# Patient Record
Sex: Male | Born: 1950
Health system: Southern US, Community
[De-identification: ages and names within clinical notes are randomized; demographics above are authoritative.]

## PROBLEM LIST (undated history)

## (undated) DIAGNOSIS — Z8619 Personal history of other infectious and parasitic diseases: Secondary | ICD-10-CM

## (undated) DIAGNOSIS — H5440 Blindness, one eye, unspecified eye: Secondary | ICD-10-CM

## (undated) DIAGNOSIS — K219 Gastro-esophageal reflux disease without esophagitis: Secondary | ICD-10-CM

## (undated) DIAGNOSIS — E871 Hypo-osmolality and hyponatremia: Secondary | ICD-10-CM

## (undated) DIAGNOSIS — N400 Enlarged prostate without lower urinary tract symptoms: Secondary | ICD-10-CM

## (undated) DIAGNOSIS — F1910 Other psychoactive substance abuse, uncomplicated: Secondary | ICD-10-CM

## (undated) DIAGNOSIS — K759 Inflammatory liver disease, unspecified: Secondary | ICD-10-CM

## (undated) DIAGNOSIS — D696 Thrombocytopenia, unspecified: Secondary | ICD-10-CM

## (undated) DIAGNOSIS — J449 Chronic obstructive pulmonary disease, unspecified: Secondary | ICD-10-CM

## (undated) DIAGNOSIS — H409 Unspecified glaucoma: Secondary | ICD-10-CM

## (undated) DIAGNOSIS — M75100 Unspecified rotator cuff tear or rupture of unspecified shoulder, not specified as traumatic: Secondary | ICD-10-CM

## (undated) HISTORY — DX: Other psychoactive substance abuse, uncomplicated: F19.10

## (undated) HISTORY — PX: CATARACT EXTRACTION: SUR2

---

## 2015-01-08 DIAGNOSIS — H2513 Age-related nuclear cataract, bilateral: Secondary | ICD-10-CM | POA: Diagnosis not present

## 2015-01-24 DIAGNOSIS — H40051 Ocular hypertension, right eye: Secondary | ICD-10-CM | POA: Diagnosis not present

## 2015-01-24 DIAGNOSIS — H2513 Age-related nuclear cataract, bilateral: Secondary | ICD-10-CM | POA: Diagnosis not present

## 2015-01-24 DIAGNOSIS — H401124 Primary open-angle glaucoma, left eye, indeterminate stage: Secondary | ICD-10-CM | POA: Diagnosis not present

## 2015-01-24 DIAGNOSIS — H5440 Blindness, one eye, unspecified eye: Secondary | ICD-10-CM | POA: Diagnosis not present

## 2015-01-24 DIAGNOSIS — H269 Unspecified cataract: Secondary | ICD-10-CM | POA: Diagnosis not present

## 2015-01-24 DIAGNOSIS — H25013 Cortical age-related cataract, bilateral: Secondary | ICD-10-CM | POA: Diagnosis not present

## 2015-02-19 DIAGNOSIS — H401124 Primary open-angle glaucoma, left eye, indeterminate stage: Secondary | ICD-10-CM | POA: Diagnosis not present

## 2015-04-24 DIAGNOSIS — F1210 Cannabis abuse, uncomplicated: Secondary | ICD-10-CM | POA: Diagnosis not present

## 2015-04-24 DIAGNOSIS — H53131 Sudden visual loss, right eye: Secondary | ICD-10-CM | POA: Diagnosis not present

## 2015-04-24 DIAGNOSIS — Z23 Encounter for immunization: Secondary | ICD-10-CM | POA: Diagnosis not present

## 2015-04-24 DIAGNOSIS — F1410 Cocaine abuse, uncomplicated: Secondary | ICD-10-CM | POA: Diagnosis not present

## 2015-04-24 DIAGNOSIS — Z7251 High risk heterosexual behavior: Secondary | ICD-10-CM | POA: Diagnosis not present

## 2015-04-24 DIAGNOSIS — H409 Unspecified glaucoma: Secondary | ICD-10-CM | POA: Diagnosis not present

## 2015-04-24 DIAGNOSIS — H269 Unspecified cataract: Secondary | ICD-10-CM | POA: Diagnosis not present

## 2015-04-24 DIAGNOSIS — F17210 Nicotine dependence, cigarettes, uncomplicated: Secondary | ICD-10-CM | POA: Diagnosis not present

## 2015-05-06 DIAGNOSIS — H401124 Primary open-angle glaucoma, left eye, indeterminate stage: Secondary | ICD-10-CM | POA: Diagnosis not present

## 2015-05-06 DIAGNOSIS — H40052 Ocular hypertension, left eye: Secondary | ICD-10-CM | POA: Diagnosis not present

## 2015-05-06 DIAGNOSIS — H40051 Ocular hypertension, right eye: Secondary | ICD-10-CM | POA: Diagnosis not present

## 2015-05-06 DIAGNOSIS — H44511 Absolute glaucoma, right eye: Secondary | ICD-10-CM | POA: Diagnosis not present

## 2015-05-06 DIAGNOSIS — H5340 Unspecified visual field defects: Secondary | ICD-10-CM | POA: Diagnosis not present

## 2015-05-08 DIAGNOSIS — Z7251 High risk heterosexual behavior: Secondary | ICD-10-CM | POA: Diagnosis not present

## 2015-05-14 DIAGNOSIS — A539 Syphilis, unspecified: Secondary | ICD-10-CM | POA: Diagnosis not present

## 2015-05-22 DIAGNOSIS — H44511 Absolute glaucoma, right eye: Secondary | ICD-10-CM | POA: Diagnosis not present

## 2015-05-22 DIAGNOSIS — H25012 Cortical age-related cataract, left eye: Secondary | ICD-10-CM | POA: Diagnosis not present

## 2015-05-22 DIAGNOSIS — H2512 Age-related nuclear cataract, left eye: Secondary | ICD-10-CM | POA: Diagnosis not present

## 2015-05-22 DIAGNOSIS — H401124 Primary open-angle glaucoma, left eye, indeterminate stage: Secondary | ICD-10-CM | POA: Diagnosis not present

## 2015-05-22 DIAGNOSIS — H40052 Ocular hypertension, left eye: Secondary | ICD-10-CM | POA: Diagnosis not present

## 2015-05-22 DIAGNOSIS — H40051 Ocular hypertension, right eye: Secondary | ICD-10-CM | POA: Diagnosis not present

## 2015-06-18 DIAGNOSIS — H2512 Age-related nuclear cataract, left eye: Secondary | ICD-10-CM | POA: Diagnosis not present

## 2015-07-23 DIAGNOSIS — J449 Chronic obstructive pulmonary disease, unspecified: Secondary | ICD-10-CM | POA: Diagnosis not present

## 2015-07-23 DIAGNOSIS — H409 Unspecified glaucoma: Secondary | ICD-10-CM | POA: Diagnosis not present

## 2015-07-23 DIAGNOSIS — F1210 Cannabis abuse, uncomplicated: Secondary | ICD-10-CM | POA: Diagnosis not present

## 2015-07-23 DIAGNOSIS — F1410 Cocaine abuse, uncomplicated: Secondary | ICD-10-CM | POA: Diagnosis not present

## 2015-07-23 DIAGNOSIS — H269 Unspecified cataract: Secondary | ICD-10-CM | POA: Diagnosis not present

## 2015-07-23 DIAGNOSIS — Z23 Encounter for immunization: Secondary | ICD-10-CM | POA: Diagnosis not present

## 2015-07-23 DIAGNOSIS — Z7251 High risk heterosexual behavior: Secondary | ICD-10-CM | POA: Diagnosis not present

## 2015-07-23 DIAGNOSIS — H53131 Sudden visual loss, right eye: Secondary | ICD-10-CM | POA: Diagnosis not present

## 2015-08-26 DIAGNOSIS — H401124 Primary open-angle glaucoma, left eye, indeterminate stage: Secondary | ICD-10-CM | POA: Diagnosis not present

## 2015-08-26 DIAGNOSIS — H44511 Absolute glaucoma, right eye: Secondary | ICD-10-CM | POA: Diagnosis not present

## 2015-08-26 DIAGNOSIS — H40051 Ocular hypertension, right eye: Secondary | ICD-10-CM | POA: Diagnosis not present

## 2015-10-16 DIAGNOSIS — F17210 Nicotine dependence, cigarettes, uncomplicated: Secondary | ICD-10-CM | POA: Diagnosis not present

## 2015-10-16 DIAGNOSIS — F1210 Cannabis abuse, uncomplicated: Secondary | ICD-10-CM | POA: Diagnosis not present

## 2015-10-16 DIAGNOSIS — F1410 Cocaine abuse, uncomplicated: Secondary | ICD-10-CM | POA: Diagnosis not present

## 2015-10-16 DIAGNOSIS — Z0000 Encounter for general adult medical examination without abnormal findings: Secondary | ICD-10-CM | POA: Diagnosis not present

## 2015-10-16 DIAGNOSIS — Z7251 High risk heterosexual behavior: Secondary | ICD-10-CM | POA: Diagnosis not present

## 2015-10-16 DIAGNOSIS — J449 Chronic obstructive pulmonary disease, unspecified: Secondary | ICD-10-CM | POA: Diagnosis not present

## 2015-10-23 DIAGNOSIS — H269 Unspecified cataract: Secondary | ICD-10-CM | POA: Insufficient documentation

## 2015-10-23 DIAGNOSIS — A539 Syphilis, unspecified: Secondary | ICD-10-CM | POA: Insufficient documentation

## 2015-10-23 DIAGNOSIS — F1410 Cocaine abuse, uncomplicated: Secondary | ICD-10-CM | POA: Insufficient documentation

## 2015-10-24 DIAGNOSIS — H53131 Sudden visual loss, right eye: Secondary | ICD-10-CM | POA: Diagnosis not present

## 2015-10-24 DIAGNOSIS — H409 Unspecified glaucoma: Secondary | ICD-10-CM | POA: Diagnosis not present

## 2015-10-24 DIAGNOSIS — J449 Chronic obstructive pulmonary disease, unspecified: Secondary | ICD-10-CM | POA: Diagnosis not present

## 2015-10-24 DIAGNOSIS — H269 Unspecified cataract: Secondary | ICD-10-CM | POA: Diagnosis not present

## 2015-10-24 DIAGNOSIS — Z7251 High risk heterosexual behavior: Secondary | ICD-10-CM | POA: Diagnosis not present

## 2015-10-24 DIAGNOSIS — F17210 Nicotine dependence, cigarettes, uncomplicated: Secondary | ICD-10-CM | POA: Diagnosis not present

## 2015-10-24 DIAGNOSIS — A539 Syphilis, unspecified: Secondary | ICD-10-CM | POA: Diagnosis not present

## 2015-10-24 DIAGNOSIS — F1410 Cocaine abuse, uncomplicated: Secondary | ICD-10-CM | POA: Diagnosis not present

## 2015-10-24 DIAGNOSIS — Z681 Body mass index (BMI) 19 or less, adult: Secondary | ICD-10-CM | POA: Diagnosis not present

## 2015-10-24 DIAGNOSIS — F1210 Cannabis abuse, uncomplicated: Secondary | ICD-10-CM | POA: Diagnosis not present

## 2015-11-01 DIAGNOSIS — R079 Chest pain, unspecified: Secondary | ICD-10-CM | POA: Diagnosis not present

## 2015-11-01 DIAGNOSIS — F1410 Cocaine abuse, uncomplicated: Secondary | ICD-10-CM | POA: Diagnosis not present

## 2015-11-01 DIAGNOSIS — F1010 Alcohol abuse, uncomplicated: Secondary | ICD-10-CM | POA: Diagnosis not present

## 2016-01-07 DIAGNOSIS — H44511 Absolute glaucoma, right eye: Secondary | ICD-10-CM | POA: Diagnosis not present

## 2016-01-07 DIAGNOSIS — H401124 Primary open-angle glaucoma, left eye, indeterminate stage: Secondary | ICD-10-CM | POA: Diagnosis not present

## 2016-01-07 DIAGNOSIS — H40051 Ocular hypertension, right eye: Secondary | ICD-10-CM | POA: Diagnosis not present

## 2016-01-07 DIAGNOSIS — H5340 Unspecified visual field defects: Secondary | ICD-10-CM | POA: Diagnosis not present

## 2016-01-20 DIAGNOSIS — F17200 Nicotine dependence, unspecified, uncomplicated: Secondary | ICD-10-CM | POA: Insufficient documentation

## 2016-01-20 DIAGNOSIS — Z7251 High risk heterosexual behavior: Secondary | ICD-10-CM | POA: Insufficient documentation

## 2016-01-20 DIAGNOSIS — F1210 Cannabis abuse, uncomplicated: Secondary | ICD-10-CM | POA: Insufficient documentation

## 2016-01-21 DIAGNOSIS — F1210 Cannabis abuse, uncomplicated: Secondary | ICD-10-CM | POA: Diagnosis not present

## 2016-01-21 DIAGNOSIS — Z7251 High risk heterosexual behavior: Secondary | ICD-10-CM | POA: Diagnosis not present

## 2016-01-21 DIAGNOSIS — D509 Iron deficiency anemia, unspecified: Secondary | ICD-10-CM | POA: Diagnosis not present

## 2016-01-21 DIAGNOSIS — J449 Chronic obstructive pulmonary disease, unspecified: Secondary | ICD-10-CM | POA: Diagnosis not present

## 2016-01-21 DIAGNOSIS — F17210 Nicotine dependence, cigarettes, uncomplicated: Secondary | ICD-10-CM | POA: Diagnosis not present

## 2016-01-21 DIAGNOSIS — H53131 Sudden visual loss, right eye: Secondary | ICD-10-CM | POA: Diagnosis not present

## 2017-01-07 DIAGNOSIS — H524 Presbyopia: Secondary | ICD-10-CM | POA: Diagnosis not present

## 2017-01-07 DIAGNOSIS — Z0100 Encounter for examination of eyes and vision without abnormal findings: Secondary | ICD-10-CM | POA: Diagnosis not present

## 2017-03-10 DIAGNOSIS — Z681 Body mass index (BMI) 19 or less, adult: Secondary | ICD-10-CM | POA: Diagnosis not present

## 2017-03-10 DIAGNOSIS — M66821 Spontaneous rupture of other tendons, right upper arm: Secondary | ICD-10-CM | POA: Diagnosis not present

## 2018-04-19 DIAGNOSIS — M545 Low back pain: Secondary | ICD-10-CM | POA: Diagnosis not present

## 2018-04-19 DIAGNOSIS — Z681 Body mass index (BMI) 19 or less, adult: Secondary | ICD-10-CM | POA: Diagnosis not present

## 2018-04-27 ENCOUNTER — Encounter: Payer: Self-pay | Admitting: Internal Medicine

## 2018-04-27 DIAGNOSIS — R350 Frequency of micturition: Secondary | ICD-10-CM | POA: Diagnosis not present

## 2018-04-27 DIAGNOSIS — L299 Pruritus, unspecified: Secondary | ICD-10-CM | POA: Diagnosis not present

## 2018-04-27 DIAGNOSIS — F1410 Cocaine abuse, uncomplicated: Secondary | ICD-10-CM | POA: Diagnosis not present

## 2018-04-27 DIAGNOSIS — J449 Chronic obstructive pulmonary disease, unspecified: Secondary | ICD-10-CM | POA: Diagnosis not present

## 2018-04-27 DIAGNOSIS — D509 Iron deficiency anemia, unspecified: Secondary | ICD-10-CM | POA: Diagnosis not present

## 2018-04-27 DIAGNOSIS — F1210 Cannabis abuse, uncomplicated: Secondary | ICD-10-CM | POA: Diagnosis not present

## 2018-04-27 DIAGNOSIS — R358 Other polyuria: Secondary | ICD-10-CM | POA: Diagnosis not present

## 2018-04-27 DIAGNOSIS — A539 Syphilis, unspecified: Secondary | ICD-10-CM | POA: Diagnosis not present

## 2018-04-27 DIAGNOSIS — F17210 Nicotine dependence, cigarettes, uncomplicated: Secondary | ICD-10-CM | POA: Diagnosis not present

## 2018-04-27 DIAGNOSIS — R1310 Dysphagia, unspecified: Secondary | ICD-10-CM | POA: Diagnosis not present

## 2018-04-27 DIAGNOSIS — Z681 Body mass index (BMI) 19 or less, adult: Secondary | ICD-10-CM | POA: Diagnosis not present

## 2018-04-29 DIAGNOSIS — A539 Syphilis, unspecified: Secondary | ICD-10-CM | POA: Diagnosis not present

## 2018-04-29 DIAGNOSIS — D509 Iron deficiency anemia, unspecified: Secondary | ICD-10-CM | POA: Diagnosis not present

## 2018-05-02 ENCOUNTER — Encounter (INDEPENDENT_AMBULATORY_CARE_PROVIDER_SITE_OTHER): Payer: Self-pay | Admitting: *Deleted

## 2018-05-02 ENCOUNTER — Ambulatory Visit (INDEPENDENT_AMBULATORY_CARE_PROVIDER_SITE_OTHER): Payer: Medicare HMO | Admitting: Internal Medicine

## 2018-05-02 ENCOUNTER — Encounter (INDEPENDENT_AMBULATORY_CARE_PROVIDER_SITE_OTHER): Payer: Self-pay | Admitting: Internal Medicine

## 2018-05-02 VITALS — BP 146/83 | HR 74 | Temp 98.0°F | Ht 72.0 in | Wt 136.8 lb

## 2018-05-02 DIAGNOSIS — R1310 Dysphagia, unspecified: Secondary | ICD-10-CM | POA: Insufficient documentation

## 2018-05-02 DIAGNOSIS — R1319 Other dysphagia: Secondary | ICD-10-CM

## 2018-05-02 DIAGNOSIS — B1920 Unspecified viral hepatitis C without hepatic coma: Secondary | ICD-10-CM | POA: Insufficient documentation

## 2018-05-02 DIAGNOSIS — B182 Chronic viral hepatitis C: Secondary | ICD-10-CM | POA: Diagnosis not present

## 2018-05-02 NOTE — Progress Notes (Signed)
   Subjective:    Patient ID: Ian Boyd, male    DOB: 10/01/1950, 68 y.o.   MRN: [id number]  HPI  Referred by Dr. Pleas Koch for dysphagia. Dysphagia has been occurring for about 10 yrs. Not a daily occurrence. No foods in particular bother him. Sometimes it is hard to swallow water. States foods are very slow to go down.  Appetite is okay. States had a cold about 3 weeks ago and lost about 3 pounds. BMs move normal. No melena or BRRB.   States he does not take any medications. He smokes marijuana and cocaine.  Does cocaine x 1 a week Hx if IV drugs about 40 yrs.    04/28/2018 H and H 15.6 and 46.2, ALP 86, AST 49, ALT 75 Hepatitis C antibody positive  Review of Systems Past Medical History:  Diagnosis Date  . Drug abuse ([place])       No Known Allergies  No current outpatient medications on file prior to visit.   No current facility-administered medications on file prior to visit.         Objective:   Physical Exam Blood pressure (!) 146/83, pulse 74, temperature 98 F (36.7 C), height 6' (1.829 m), weight 136 lb 12.8 oz (62.1 kg). Alert and oriented. Skin warm and dry. Oral mucosa is moist.   . Sclera anicteric, conjunctivae is pink. Thyroid not enlarged. No cervical lymphadenopathy. Lungs clear. Heart regular rate and rhythm.  Abdomen is soft. Bowel sounds are positive. No hepatomegaly. No abdominal masses felt. No tenderness.  No edema to lower extremities.           Assessment & Plan:  Dysphagia. DG esophagram. If EGD/needed will need to be with propofol.  Hepatitis C +antibody. Hep C quaint, Hep C genotype

## 2018-05-02 NOTE — Patient Instructions (Signed)
Labs, and esophagram.

## 2018-05-03 ENCOUNTER — Ambulatory Visit (HOSPITAL_COMMUNITY)
Admission: RE | Admit: 2018-05-03 | Discharge: 2018-05-03 | Disposition: A | Discharge status: Home / Self Care | Payer: Medicare HMO | Source: Ambulatory Visit | Attending: Internal Medicine | Admitting: Internal Medicine

## 2018-05-03 ENCOUNTER — Other Ambulatory Visit (INDEPENDENT_AMBULATORY_CARE_PROVIDER_SITE_OTHER): Payer: Self-pay | Admitting: Internal Medicine

## 2018-05-03 DIAGNOSIS — K228 Other specified diseases of esophagus: Secondary | ICD-10-CM | POA: Diagnosis not present

## 2018-05-03 DIAGNOSIS — R1310 Dysphagia, unspecified: Secondary | ICD-10-CM | POA: Diagnosis not present

## 2018-05-03 DIAGNOSIS — R1319 Other dysphagia: Secondary | ICD-10-CM

## 2018-05-04 ENCOUNTER — Telehealth (INDEPENDENT_AMBULATORY_CARE_PROVIDER_SITE_OTHER): Payer: Self-pay | Admitting: Internal Medicine

## 2018-05-04 ENCOUNTER — Other Ambulatory Visit (INDEPENDENT_AMBULATORY_CARE_PROVIDER_SITE_OTHER): Payer: Self-pay | Admitting: Internal Medicine

## 2018-05-04 DIAGNOSIS — R1310 Dysphagia, unspecified: Secondary | ICD-10-CM

## 2018-05-04 DIAGNOSIS — R1319 Other dysphagia: Secondary | ICD-10-CM

## 2018-05-04 NOTE — Telephone Encounter (Signed)
OV in 8 weeks.  

## 2018-05-05 LAB — HEPATITIS C RNA QUANTITATIVE
HCV Quantitative Log: 6.8 Log IU/mL — ABNORMAL HIGH
HCV RNA, PCR, QN: 6320000 IU/mL — ABNORMAL HIGH

## 2018-05-05 LAB — HEPATITIS C GENOTYPE

## 2018-05-06 DIAGNOSIS — J439 Emphysema, unspecified: Secondary | ICD-10-CM | POA: Diagnosis not present

## 2018-05-06 DIAGNOSIS — I700 Atherosclerosis of aorta: Secondary | ICD-10-CM | POA: Diagnosis not present

## 2018-05-06 DIAGNOSIS — R079 Chest pain, unspecified: Secondary | ICD-10-CM | POA: Diagnosis not present

## 2018-05-06 DIAGNOSIS — N200 Calculus of kidney: Secondary | ICD-10-CM | POA: Diagnosis not present

## 2018-05-06 DIAGNOSIS — F17210 Nicotine dependence, cigarettes, uncomplicated: Secondary | ICD-10-CM | POA: Diagnosis not present

## 2018-05-06 DIAGNOSIS — R911 Solitary pulmonary nodule: Secondary | ICD-10-CM | POA: Diagnosis not present

## 2018-05-12 ENCOUNTER — Other Ambulatory Visit: Payer: Self-pay

## 2018-05-12 ENCOUNTER — Encounter (HOSPITAL_COMMUNITY): Payer: Self-pay

## 2018-05-12 ENCOUNTER — Ambulatory Visit (HOSPITAL_COMMUNITY)
Admission: AC | Admit: 2018-05-12 | Discharge: 2018-05-12 | Disposition: A | Discharge status: Home / Self Care | Payer: Medicare HMO | Attending: Internal Medicine | Admitting: Internal Medicine

## 2018-05-12 ENCOUNTER — Encounter (HOSPITAL_COMMUNITY)
Admission: AC | Disposition: A | Discharge status: Home / Self Care | Payer: Self-pay | Source: Home / Self Care | Attending: Internal Medicine

## 2018-05-12 DIAGNOSIS — K229 Disease of esophagus, unspecified: Secondary | ICD-10-CM | POA: Insufficient documentation

## 2018-05-12 DIAGNOSIS — R1314 Dysphagia, pharyngoesophageal phase: Secondary | ICD-10-CM | POA: Insufficient documentation

## 2018-05-12 DIAGNOSIS — K2970 Gastritis, unspecified, without bleeding: Secondary | ICD-10-CM

## 2018-05-12 DIAGNOSIS — R933 Abnormal findings on diagnostic imaging of other parts of digestive tract: Secondary | ICD-10-CM | POA: Insufficient documentation

## 2018-05-12 DIAGNOSIS — K269 Duodenal ulcer, unspecified as acute or chronic, without hemorrhage or perforation: Secondary | ICD-10-CM

## 2018-05-12 DIAGNOSIS — Z79899 Other long term (current) drug therapy: Secondary | ICD-10-CM | POA: Insufficient documentation

## 2018-05-12 DIAGNOSIS — H5461 Unqualified visual loss, right eye, normal vision left eye: Secondary | ICD-10-CM | POA: Diagnosis not present

## 2018-05-12 DIAGNOSIS — K210 Gastro-esophageal reflux disease with esophagitis: Secondary | ICD-10-CM | POA: Insufficient documentation

## 2018-05-12 DIAGNOSIS — R1310 Dysphagia, unspecified: Secondary | ICD-10-CM

## 2018-05-12 DIAGNOSIS — K449 Diaphragmatic hernia without obstruction or gangrene: Secondary | ICD-10-CM | POA: Insufficient documentation

## 2018-05-12 DIAGNOSIS — R1319 Other dysphagia: Secondary | ICD-10-CM

## 2018-05-12 DIAGNOSIS — F17210 Nicotine dependence, cigarettes, uncomplicated: Secondary | ICD-10-CM | POA: Diagnosis not present

## 2018-05-12 DIAGNOSIS — K222 Esophageal obstruction: Secondary | ICD-10-CM

## 2018-05-12 HISTORY — DX: Blindness, one eye, unspecified eye: H54.40

## 2018-05-12 HISTORY — PX: ESOPHAGOGASTRODUODENOSCOPY: SHX5428

## 2018-05-12 HISTORY — PX: ESOPHAGEAL DILATION: SHX303

## 2018-05-12 LAB — KOH PREP

## 2018-05-12 SURGERY — EGD (ESOPHAGOGASTRODUODENOSCOPY)
Anesthesia: Moderate Sedation

## 2018-05-12 MED ORDER — NYSTATIN 100000 UNIT/ML MT SUSP: 5.0000 mL | Freq: Four times a day (QID) | OROMUCOSAL | 0 refills | Status: DC

## 2018-05-12 MED ORDER — MEPERIDINE HCL 50 MG/ML IJ SOLN
INTRAMUSCULAR | Status: DC | PRN
  Administered 2018-05-12 (×2): 25 mg via INTRAVENOUS

## 2018-05-12 MED ORDER — MEPERIDINE HCL 100 MG/ML IJ SOLN
INTRAMUSCULAR | Status: AC
  Filled 2018-05-12: qty 1

## 2018-05-12 MED ORDER — PANTOPRAZOLE SODIUM 40 MG PO TBEC: 40.0000 mg | DELAYED_RELEASE_TABLET | Freq: Two times a day (BID) | ORAL | 2 refills | Status: DC

## 2018-05-12 MED ORDER — SODIUM CHLORIDE 0.9 % IV SOLN
INTRAVENOUS | Status: DC
  Administered 2018-05-12: 14:00:00 via INTRAVENOUS

## 2018-05-12 MED ORDER — LIDOCAINE VISCOUS HCL 2 % MT SOLN
OROMUCOSAL | Status: DC | PRN
  Administered 2018-05-12: 4 mL via OROMUCOSAL

## 2018-05-12 MED ORDER — LIDOCAINE VISCOUS HCL 2 % MT SOLN
OROMUCOSAL | Status: AC
  Filled 2018-05-12: qty 15

## 2018-05-12 MED ORDER — MIDAZOLAM HCL 5 MG/5ML IJ SOLN
INTRAMUSCULAR | Status: AC
  Filled 2018-05-12: qty 10

## 2018-05-12 MED ORDER — STERILE WATER FOR IRRIGATION IR SOLN
Status: DC | PRN
  Administered 2018-05-12: 15:00:00

## 2018-05-12 MED ORDER — MIDAZOLAM HCL 5 MG/5ML IJ SOLN
INTRAMUSCULAR | Status: DC | PRN
  Administered 2018-05-12: 2 mg via INTRAVENOUS
  Administered 2018-05-12: 1 mg via INTRAVENOUS
  Administered 2018-05-12: 2 mg via INTRAVENOUS

## 2018-05-12 NOTE — Op Note (Signed)
[facility] Patient Name: Ian Boyd Procedure Date: 05/12/2018 2:36 PM MRN: [id number] Date of Birth: 1950-08-29 Attending MD: Hildred Laser , MD CSN: [id number] Age: 68 Admit Type: Outpatient Procedure:                Upper GI endoscopy Indications:              Esophageal dysphagia, Abnormal cine-esophagram Providers:                Hildred Laser, MD, Otis Peak B. Sharon Seller, RN, Randa Spike, Technician Referring MD:             Curlene Labrum, MD Medicines:                Lidocaine spray, Meperidine 50 mg IV, Midazolam 5                            mg IV Complications:            No immediate complications. Estimated Blood Loss:     Estimated blood loss was minimal. Procedure:                Pre-Anesthesia Assessment:                           - Prior to the procedure, a History and Physical                            was performed, and patient medications and                            allergies were reviewed. The patient's tolerance of                            previous anesthesia was also reviewed. The risks                            and benefits of the procedure and the sedation                            options and risks were discussed with the patient.                            All questions were answered, and informed consent                            was obtained. Prior Anticoagulants: The patient has                            taken no previous anticoagulant or antiplatelet                            agents. ASA Grade Assessment: II - A patient with  mild systemic disease. After reviewing the risks                            and benefits, the patient was deemed in                            satisfactory condition to undergo the procedure.                           After obtaining informed consent, the endoscope was                            passed under direct vision. Throughout the                             procedure, the patient's blood pressure, pulse, and                            oxygen saturations were monitored continuously. The                            GIF-H190 ([id number]) scope was introduced through the                            mouth, and advanced to the second part of duodenum.                            The upper GI endoscopy was accomplished without                            difficulty. The patient tolerated the procedure                            well. Scope In: 2:48:46 PM Scope Out: 3:03:45 PM Total Procedure Duration: 0 hours 14 minutes 59 seconds  Findings:      Patchy, white plaques were found in the upper third of the esophagus and       in the middle third of the esophagus. Cells for cytology were obtained       by brushing.      One benign-appearing, intrinsic moderate stenosis/ spastic segment was       found 37 cm from the incisors. The stenosis was traversed. A TTS dilator       was passed through the scope. Dilation with a 15-16.5-18 mm balloon       dilator was performed to 15 mm, 16.5 mm and 18 mm. The dilation site was       examined and showed mild mucosal disruption, mild improvement in luminal       narrowing and no perforation.      LA Grade A (one or more mucosal breaks less than 5 mm, not extending       between tops of 2 mucosal folds) esophagitis was found 38 cm from the       incisors.      A 2 cm hiatal hernia was present.      Diffuse mild inflammation characterized by congestion (  edema) and       erythema was found in the entire examined stomach.      The exam of the stomach was otherwise normal.      One non-bleeding cratered duodenal ulcer with no stigmata of bleeding       was found in the duodenal bulb. The lesion was 10 mm in largest       dimension.      The second portion of the duodenum was normal. Impression:               - Esophageal plaques were found, suspicious for                            candidiasis. Cells for cytology  obtained.                           - Benign-appearing distal esophageal                            stenosis/spastic segment. Dilated.                           - LA Grade A reflux esophagitis.                           - 2 cm hiatal hernia.                           - Gastritis.                           - One non-bleeding duodenal ulcer with no stigmata                            of bleeding.                           - Normal second portion of the duodenum. Moderate Sedation:      Moderate (conscious) sedation was administered by the endoscopy nurse       and supervised by the endoscopist. The following parameters were       monitored: oxygen saturation, heart rate, blood pressure, CO2       capnography and response to care. Total physician intraservice time was       19 minutes. Recommendation:           - Patient has a contact number available for                            emergencies. The signs and symptoms of potential                            delayed complications were discussed with the                            patient. Return to normal activities tomorrow.  Written discharge instructions were provided to the                            patient.                           - Resume previous diet today.                           - Continue present medications.                           - No aspirin, ibuprofen, naproxen, or other                            non-steroidal anti-inflammatory drugs.                           - Use Protonix (pantoprazole) 40 mg PO BID.                           - Mucostatin 500,000 units swish and swallow qid                            for 10 days.                           - H.Pylori serology. Procedure Code(s):        --- Professional ---                           [phone], Esophagogastroduodenoscopy, flexible,                            transoral; with transendoscopic balloon dilation of                             esophagus (less than 30 mm diameter)                           G0500, Moderate sedation services provided by the                            same physician or other qualified health care                            professional performing a gastrointestinal                            endoscopic service that sedation supports,                            requiring the presence of an independent trained                            observer to assist in the monitoring of the  patient's level of consciousness and physiological                            status; initial 15 minutes of intra-service time;                            patient age 54 years or older (additional time may                            be reported with [phone], as appropriate) Diagnosis Code(s):        --- Professional ---                           K22.9, Disease of esophagus, unspecified                           K22.2, Esophageal obstruction                           K21.0, Gastro-esophageal reflux disease with                            esophagitis                           K44.9, Diaphragmatic hernia without obstruction or                            gangrene                           K29.70, Gastritis, unspecified, without bleeding                           K26.9, Duodenal ulcer, unspecified as acute or                            chronic, without hemorrhage or perforation                           R13.14, Dysphagia, pharyngoesophageal phase                           R93.3, Abnormal findings on diagnostic imaging of                            other parts of digestive tract CPT copyright 2018 American Medical Association. All rights reserved. The codes documented in this report are preliminary and upon coder review may  be revised to meet current compliance requirements. Hildred Laser, MD Hildred Laser, MD 05/12/2018 3:20:19 PM This report has been signed electronically. Number of Addenda: 0

## 2018-05-12 NOTE — Discharge Instructions (Signed)
Refrain from using aspirin Advil/ibuprofen or Aleve because you have peptic ulcer. Resume usual medications. Pantoprazole 40 mg by mouth 30 minutes before breakfast and evening meal daily. Mycostatin suspension swish and swallow 4 times a day until prescription runs out. Resume usual diet. No driving for 24 hours. Patient will call with results of test and brushing.   Upper Endoscopy, Adult, Care After This sheet gives you information about how to care for yourself after your procedure. Your health care provider may also give you more specific instructions. If you have problems or questions, contact your health care provider. What can I expect after the procedure? After the procedure, it is common to have:  A sore throat.  Mild stomach pain or discomfort.  Bloating.  Nausea. Follow these instructions at home:   Follow instructions from your health care provider about what to eat or drink after your procedure.  Return to your normal activities as told by your health care provider. Ask your health care provider what activities are safe for you.  Take over-the-counter and prescription medicines only as told by your health care provider.  Do not drive for 24 hours if you were given a sedative during your procedure.  Keep all follow-up visits as told by your health care provider. This is important. Contact a health care provider if you have:  A sore throat that lasts longer than one day.  Trouble swallowing. Get help right away if:  You vomit blood or your vomit looks like coffee grounds.  You have: ? A fever. ? Bloody, black, or tarry stools. ? A severe sore throat or you cannot swallow. ? Difficulty breathing. ? Severe pain in your chest or abdomen. Summary  After the procedure, it is common to have a sore throat, mild stomach discomfort, bloating, and nausea.  Do not drive for 24 hours if you were given a sedative during the procedure.  Follow instructions from  your health care provider about what to eat or drink after your procedure.  Return to your normal activities as told by your health care provider. This information is not intended to replace advice given to you by your health care provider. Make sure you discuss any questions you have with your health care provider. Document Released: 09/08/2011 Document Revised: 08/09/2017 Document Reviewed: 08/09/2017 Elsevier Interactive Patient Education  2019 Elsevier Inc.   Peptic Ulcer  A peptic ulcer is a painful sore in the lining of your stomach or the first part of your small intestine. What are the causes? Common causes of this condition include:  An infection.  Using certain pain medicines too often or too much. What increases the risk? You are more likely to get this condition if you:  Smoke.  Have a family history of ulcer disease.  Drink alcohol.  Have been hospitalized in an intensive care unit (ICU). What are the signs or symptoms? Symptoms include:  Burning pain in the area between the chest and the belly button. The pain may: ? Not go away (be persistent). ? Be worse when your stomach is empty. ? Be worse at night.  Heartburn.  Feeling sick to your stomach (nauseous) and throwing up (vomiting).  Bloating. If the ulcer results in bleeding, it can cause you to:  Have poop (stool) that is black and looks like tar.  Throw up bright red blood.  Throw up material that looks like coffee grounds. How is this treated? Treatment for this condition may include:  Stopping things that can cause  the ulcer, such as: ? Smoking. ? Using pain medicines.  Medicines to reduce stomach acid.  Antibiotic medicines if the ulcer is caused by an infection.  A procedure that is done using a small, flexible tube that has a camera at the end (upper endoscopy). This may be done if you have a bleeding ulcer.  Surgery. This may be needed if: ? You have a lot of bleeding. ? The ulcer  caused a hole somewhere in the digestive system. Follow these instructions at home:  Do not drink alcohol if your doctor tells you not to drink.  Limit how much caffeine you take in.  Do not use any products that contain nicotine or tobacco, such as cigarettes, e-cigarettes, and chewing tobacco. If you need help quitting, ask your doctor.  Take over-the-counter and prescription medicines only as told by your doctor. ? Do not stop or change your medicines unless you talk with your doctor about it first. ? Do not take aspirin, ibuprofen, or other NSAIDs unless your doctor told you to do so.  Keep all follow-up visits as told by your doctor. This is important. Contact a doctor if:  You do not get better in 7 days after you start treatment.  You keep having an upset stomach (indigestion) or heartburn. Get help right away if:  You have sudden, sharp pain in your belly (abdomen).  You have belly pain that does not go away.  You have bloody poop (stool) or black, tarry poop.  You throw up blood. It may look like coffee grounds.  You feel light-headed or feel like you may pass out (faint).  You get weak.  You get sweaty or feel sticky and cold to the touch (clammy). Summary  Symptoms of a peptic ulcer include burning pain in the area between the chest and the belly button.  Take medicines only as told by your doctor.  Limit how much alcohol and caffeine you have.  Keep all follow-up visits as told by your doctor. This information is not intended to replace advice given to you by your health care provider. Make sure you discuss any questions you have with your health care provider. Document Released: 06/03/2009 Document Revised: 09/14/2017 Document Reviewed: 09/14/2017 Elsevier Interactive Patient Education  2019 Elsevier Inc.  Gastritis, Adult  Gastritis is swelling (inflammation) of the stomach. Gastritis can develop quickly (acute). It can also develop slowly over time  (chronic). It is important to get help for this condition. If you do not get help, your stomach can bleed, and you can get sores (ulcers) in your stomach. What are the causes? This condition may be caused by:  Germs that get to your stomach.  Drinking too much alcohol.  Medicines you are taking.  Too much acid in the stomach.  A disease of the intestines or stomach.  Stress.  An allergic reaction.  Crohn's disease.  Some cancer treatments (radiation). Sometimes the cause of this condition is not known. What are the signs or symptoms? Symptoms of this condition include:  Pain in your stomach.  A burning feeling in your stomach.  Feeling sick to your stomach (nauseous).  Throwing up (vomiting).  Feeling too full after you eat.  Weight loss.  Bad breath.  Throwing up blood.  Blood in your poop (stool). How is this diagnosed? This condition may be diagnosed with:  Your medical history and symptoms.  A physical exam.  Tests. These can include: ? Blood tests. ? Stool tests. ? A procedure to  look inside your stomach (upper endoscopy). ? A test in which a sample of tissue is taken for testing (biopsy). How is this treated? Treatment for this condition depends on what caused it. You may be given:  Antibiotic medicine, if your condition was caused by germs.  H2 blockers and similar medicines, if your condition was caused by too much acid. Follow these instructions at home: Medicines  Take over-the-counter and prescription medicines only as told by your doctor.  If you were prescribed an antibiotic medicine, take it as told by your doctor. Do not stop taking it even if you start to feel better. Eating and drinking   Eat small meals often, instead of large meals.  Avoid foods and drinks that make your symptoms worse.  Drink enough fluid to keep your pee (urine) pale yellow. Alcohol use  Do not drink alcohol if: ? Your doctor tells you not to  drink. ? You are pregnant, may be pregnant, or are planning to become pregnant.  If you drink alcohol: ? Limit your use to:  0-1 drink a day for women.  0-2 drinks a day for men. ? Be aware of how much alcohol is in your drink. In the [place] one drink equals one 12 oz bottle of beer (355 mL), one 5 oz glass of wine (148 mL), or one 1 oz glass of hard liquor (44 mL). General instructions  Talk with your doctor about ways to manage stress. You can exercise or do deep breathing, meditation, or yoga.  Do not smoke or use products that have nicotine or tobacco. If you need help quitting, ask your doctor.  Keep all follow-up visits as told by your doctor. This is important. Contact a doctor if:  Your symptoms get worse.  Your symptoms go away and then come back. Get help right away if:  You throw up blood or something that looks like coffee grounds.  You have black or dark red poop.  You throw up any time you try to drink fluids.  Your stomach pain gets worse.  You have a fever.  You do not feel better after one week. Summary  Gastritis is swelling (inflammation) of the stomach.  You must get help for this condition. If you do not get help, your stomach can bleed, and you can get sores (ulcers).  This condition is diagnosed with medical history, physical exam, or tests.  You can be treated with medicines for germs or medicines to block too much acid in your stomach. This information is not intended to replace advice given to you by your health care provider. Make sure you discuss any questions you have with your health care provider. Document Released: 08/26/2007 Document Revised: 07/27/2017 Document Reviewed: 07/27/2017 Elsevier Interactive Patient Education  2019 [employer].

## 2018-05-12 NOTE — H&P (Signed)
Ian Boyd is an 68 y.o. male.   Chief Complaint: Patient is here for EGD and ED. HPI: Patient is 68 year old Caucasian male who presents with few history of solid food dysphagia.  Lately has been occurring more frequently and he has had several episodes of food impaction relieved with regurgitation.  He has heartburn occasionally.  Barium study was performed and suggested esophageal dysmotility and stricture GE junction preventing passage of barium pill into the stomach.  Barium study also revealed noncritical narrowing in cervical esophagus.  Barium pill passes area without any delay.  Patient states he had flulike illness last month and has lost few pounds.  He denies nausea vomiting abdominal pain or melena  Past Medical History:  Diagnosis Date  . Blind right eye   . Drug abuse ([facility])        Chronic hepatitis C.  Past Surgical History:  Procedure Laterality Date  . CATARACT EXTRACTION Left     History reviewed. No pertinent family history. Social History:  reports that he has been smoking. He has been smoking about 0.50 packs per day. He has never used smokeless tobacco. He reports current drug use. He reports that he does not drink alcohol.  Allergies: No Known Allergies  Medications Prior to Admission  Medication Sig Dispense Refill  . Multiple Vitamins-Minerals (ADULT ONE DAILY GUMMIES) CHEW Chew 2 tablets by mouth daily.    Marland Kitchen Phenyleph-Doxylamine-DM-APAP (NYQUIL SEVERE COLD/FLU PO) Take 1 Dose by mouth at bedtime as needed (congestion).      No results found for this or any previous visit (from the past 48 hour(s)). No results found.  ROS  Blood pressure (!) 187/104, pulse 82, temperature 97.7 F (36.5 C), temperature source Oral, resp. rate (!) 9, height 6' (1.829 m), weight 61.2 kg, SpO2 99 %. Physical Exam  Constitutional:  Well-developed thin African-American male in NAD.  HENT:  Mouth/Throat: Oropharynx is clear and moist.  He has complete upper and partial  lower dental plate.  Eyes: Conjunctivae are normal. No scleral icterus.  Neck: No thyromegaly present.  Cardiovascular: Normal rate, regular rhythm and normal heart sounds.  No murmur heard. Respiratory: Effort normal and breath sounds normal.  GI: Soft. He exhibits no distension and no mass. There is no abdominal tenderness.  Musculoskeletal:        General: No edema.  Lymphadenopathy:    He has no cervical adenopathy.  Neurological: He is alert.  Skin: Skin is warm and dry.     Assessment/Plan Esophageal dysphagia. Abnormal barium pill esophagogram. EGD with ED.  Hildred Laser, MD 05/12/2018, 2:39 PM

## 2018-05-13 LAB — H. PYLORI ANTIBODY, IGG: H Pylori IgG: 3.11 Index Value — ABNORMAL HIGH (ref 0.00–0.79)

## 2018-05-16 DIAGNOSIS — A539 Syphilis, unspecified: Secondary | ICD-10-CM | POA: Diagnosis not present

## 2018-05-16 DIAGNOSIS — Z681 Body mass index (BMI) 19 or less, adult: Secondary | ICD-10-CM | POA: Diagnosis not present

## 2018-05-16 DIAGNOSIS — R1310 Dysphagia, unspecified: Secondary | ICD-10-CM | POA: Diagnosis not present

## 2018-05-16 DIAGNOSIS — R358 Other polyuria: Secondary | ICD-10-CM | POA: Diagnosis not present

## 2018-05-16 DIAGNOSIS — F17210 Nicotine dependence, cigarettes, uncomplicated: Secondary | ICD-10-CM | POA: Diagnosis not present

## 2018-05-16 DIAGNOSIS — J439 Emphysema, unspecified: Secondary | ICD-10-CM | POA: Diagnosis not present

## 2018-05-16 DIAGNOSIS — L299 Pruritus, unspecified: Secondary | ICD-10-CM | POA: Diagnosis not present

## 2018-05-16 DIAGNOSIS — J449 Chronic obstructive pulmonary disease, unspecified: Secondary | ICD-10-CM | POA: Diagnosis not present

## 2018-05-16 DIAGNOSIS — I700 Atherosclerosis of aorta: Secondary | ICD-10-CM | POA: Diagnosis not present

## 2018-05-16 DIAGNOSIS — F1410 Cocaine abuse, uncomplicated: Secondary | ICD-10-CM | POA: Diagnosis not present

## 2018-05-16 DIAGNOSIS — D509 Iron deficiency anemia, unspecified: Secondary | ICD-10-CM | POA: Diagnosis not present

## 2018-05-16 DIAGNOSIS — F1210 Cannabis abuse, uncomplicated: Secondary | ICD-10-CM | POA: Diagnosis not present

## 2018-05-19 ENCOUNTER — Other Ambulatory Visit (INDEPENDENT_AMBULATORY_CARE_PROVIDER_SITE_OTHER): Payer: Self-pay | Admitting: Internal Medicine

## 2018-05-19 MED ORDER — BIS SUBCIT-METRONID-TETRACYC 140-125-125 MG PO CAPS: 3.0000 | ORAL_CAPSULE | Freq: Three times a day (TID) | ORAL | 0 refills | Status: DC

## 2018-05-19 NOTE — Progress Notes (Unsigned)
pylera  

## 2018-05-30 ENCOUNTER — Encounter (HOSPITAL_COMMUNITY): Payer: Self-pay | Admitting: Internal Medicine

## 2018-06-29 ENCOUNTER — Ambulatory Visit (INDEPENDENT_AMBULATORY_CARE_PROVIDER_SITE_OTHER): Payer: Medicare HMO | Admitting: Internal Medicine

## 2018-06-29 ENCOUNTER — Encounter (INDEPENDENT_AMBULATORY_CARE_PROVIDER_SITE_OTHER): Payer: Self-pay | Admitting: Internal Medicine

## 2018-06-29 ENCOUNTER — Other Ambulatory Visit: Payer: Self-pay

## 2018-06-29 DIAGNOSIS — B1710 Acute hepatitis C without hepatic coma: Secondary | ICD-10-CM | POA: Diagnosis not present

## 2018-06-29 DIAGNOSIS — R1310 Dysphagia, unspecified: Secondary | ICD-10-CM

## 2018-06-29 DIAGNOSIS — R1319 Other dysphagia: Secondary | ICD-10-CM

## 2018-06-29 NOTE — Progress Notes (Addendum)
   Subjective:  PCP Dr. Pleas Koch.   Patient ID: Ian Boyd, male    DOB: 1950-07-23, 68 y.o.   MRN: [id number] This is a telephone OV. I identified patient by 2 means (Name and Birthday). Patient agrees to this OV. He is at home. I am in office. Time Start 1040AM. Finish 1055am. Total OV visit time 15 minutes. Unable to do video OV. OV due to COVID-19. HPI He is doing good. His appetite is good. No dysphagia. No weight loss.  No GERD. Taking Protonix BID.  Underwent an EGD/ED 05/12/18 which revealed esophageal plaques found. Benign appearing distal esophageal stenosis, spastic segment. Dilated. LA Grade A reflux esophagitis. 2 cm hiatal hernia.  Gastritis. One non-bleeding duodenal ulcer with no stigmata of bleeding. Normal second portion of the duodenum.   Hx of Hepatitis C. He is still doing drugs. He must go thru Rehab before we treat his Hepatitis C (Per Dr. Laural Golden).  H.pylori positive and treated with Pylera. He continues to do Cocaine. Questions when he can be treated for his Hepatitis C. I advised him he has to go thru Rehab before we treat him.   Review of Systems Past Medical History:  Diagnosis Date  . Blind right eye   . Drug abuse [facility])     Past Surgical History:  Procedure Laterality Date  . CATARACT EXTRACTION Left   . ESOPHAGEAL DILATION N/A 05/12/2018   Procedure: ESOPHAGEAL DILATION;  Surgeon: Rogene Houston, MD;  Location: [facility];  Service: Endoscopy;  Laterality: N/A;  . ESOPHAGOGASTRODUODENOSCOPY N/A 05/12/2018   Procedure: ESOPHAGOGASTRODUODENOSCOPY (EGD);  Surgeon: Rogene Houston, MD;  Location: [facility] SU[facility];  Service: Endoscopy;  Laterality: N/A;  2:45    No Known Allergies  Current Outpatient Medications on File Prior to Visit  Medication Sig Dispense Refill  . Multiple Vitamins-Minerals (ADULT ONE DAILY GUMMIES) CHEW Chew 2 tablets by mouth daily.    . pantoprazole (PROTONIX) 40 MG tablet Take 1 tablet (40 mg total) by mouth 2 (two) times  daily before a meal. 90 tablet 2  . Phenyleph-Doxylamine-DM-APAP (NYQUIL SEVERE COLD/FLU PO) Take 1 Dose by mouth at bedtime as needed (congestion).     No current facility-administered medications on file prior to visit.         Objective:   Physical Exam  deferred      Assessment & Plan:  Dysphagia. He is doing well. Can eat most anything he wants. He will continue the Protonix. He states he did finish the Pylera. Hepatitis C. Patient continues to do cocaine. I advised him I could not treat him as long as he was doing drugs. He has to go thru Rehab.

## 2018-06-29 NOTE — Patient Instructions (Signed)
Continue the Protonix.  OV in 6 months

## 2018-08-04 ENCOUNTER — Other Ambulatory Visit (INDEPENDENT_AMBULATORY_CARE_PROVIDER_SITE_OTHER): Payer: Self-pay | Admitting: Internal Medicine

## 2018-09-13 DIAGNOSIS — T162XXA Foreign body in left ear, initial encounter: Secondary | ICD-10-CM | POA: Diagnosis not present

## 2018-09-13 DIAGNOSIS — H6122 Impacted cerumen, left ear: Secondary | ICD-10-CM | POA: Diagnosis not present

## 2018-09-13 DIAGNOSIS — Z681 Body mass index (BMI) 19 or less, adult: Secondary | ICD-10-CM | POA: Diagnosis not present

## 2018-12-29 ENCOUNTER — Ambulatory Visit (INDEPENDENT_AMBULATORY_CARE_PROVIDER_SITE_OTHER): Payer: Medicare HMO | Admitting: Nurse Practitioner

## 2018-12-29 NOTE — Progress Notes (Deleted)
   Subjective:    Patient ID: Ian Boyd, male    DOB: 04-25-1950, 68 y.o.   MRN: [id number]  HPI Ian Boyd. Ian Boyd is a 68 year old male with a past medical history of dysphagia, positive cocaine use and chronic hepatitis C GT1a.  He was previously advised to undergo a drug rehabilitation is required prior to initiating chronic hepatitis C treatment.  04/27/2018: Hepatitis C antibody >11.0.  Hepatitis B surface antigen negative.  Hepatitis B core antibody IgM negative.  Hepatitis A IgM antibody negative. AST 49.  ALT 75.  Alk phos 86.  Total bili 0.6.  BUN 12.  Creatinine 1.05.  Sodium 136.  Potassium 5.6.  Albumin 4.0.  WBC 8.4.  Hemoglobin 15.6.  Hematocrit 46.2.  Platelet 308.  TSH 3.67.  RPR reactive. 05/05/2018: HCV RNA 6,320,000 IU/mL. HCV genotype 1a.  ? HIV testing  EGD/ED 05/12/18:  - Esophageal plaques were found, suspicious for candidiasis. Cells for cytology obtained. - Benign-appearing distal esophageal stenosis/spastic segment. Dilated. - LA Grade A reflux esophagitis. - 2 cm hiatal hernia. - Gastritis. - One non-bleeding duodenal ulcer with no stigmata of bleeding. - Normal second portion of the duodenum.  H. Pylori IgG ab 3.11  treated with Pylera.   HIV??, Colonoscopy?  Past Medical History:  Diagnosis Date  . Blind right eye   . Drug abuse [facility])    Past Surgical History:  Procedure Laterality Date  . CATARACT EXTRACTION Left   . ESOPHAGEAL DILATION N/A 05/12/2018   Procedure: ESOPHAGEAL DILATION;  Surgeon: Rogene Houston, MD;  Location: [facility];  Service: Endoscopy;  Laterality: N/A;  . ESOPHAGOGASTRODUODENOSCOPY N/A 05/12/2018   Procedure: ESOPHAGOGASTRODUODENOSCOPY (EGD);  Surgeon: Rogene Houston, MD;  Location: [facility];  Service: Endoscopy;  Laterality: N/A;  2:45        Review of Systems     Objective:   Physical Exam        Assessment & Plan:

## 2019-06-02 DIAGNOSIS — Z23 Encounter for immunization: Secondary | ICD-10-CM | POA: Diagnosis not present

## 2019-06-21 DIAGNOSIS — Z23 Encounter for immunization: Secondary | ICD-10-CM | POA: Diagnosis not present

## 2019-07-14 DIAGNOSIS — H44511 Absolute glaucoma, right eye: Secondary | ICD-10-CM | POA: Diagnosis not present

## 2019-07-14 DIAGNOSIS — H2521 Age-related cataract, morgagnian type, right eye: Secondary | ICD-10-CM | POA: Diagnosis not present

## 2019-07-14 DIAGNOSIS — H401124 Primary open-angle glaucoma, left eye, indeterminate stage: Secondary | ICD-10-CM | POA: Diagnosis not present

## 2019-07-14 DIAGNOSIS — H40053 Ocular hypertension, bilateral: Secondary | ICD-10-CM | POA: Diagnosis not present

## 2019-08-08 DIAGNOSIS — H2521 Age-related cataract, morgagnian type, right eye: Secondary | ICD-10-CM | POA: Diagnosis not present

## 2019-08-08 DIAGNOSIS — H401124 Primary open-angle glaucoma, left eye, indeterminate stage: Secondary | ICD-10-CM | POA: Diagnosis not present

## 2019-08-29 DIAGNOSIS — H40053 Ocular hypertension, bilateral: Secondary | ICD-10-CM | POA: Diagnosis not present

## 2019-08-29 DIAGNOSIS — H44511 Absolute glaucoma, right eye: Secondary | ICD-10-CM | POA: Diagnosis not present

## 2019-08-29 DIAGNOSIS — H401124 Primary open-angle glaucoma, left eye, indeterminate stage: Secondary | ICD-10-CM | POA: Diagnosis not present

## 2019-10-10 DIAGNOSIS — H40053 Ocular hypertension, bilateral: Secondary | ICD-10-CM | POA: Diagnosis not present

## 2019-10-10 DIAGNOSIS — H401124 Primary open-angle glaucoma, left eye, indeterminate stage: Secondary | ICD-10-CM | POA: Diagnosis not present

## 2019-10-10 DIAGNOSIS — H44511 Absolute glaucoma, right eye: Secondary | ICD-10-CM | POA: Diagnosis not present

## 2019-10-30 DIAGNOSIS — Z961 Presence of intraocular lens: Secondary | ICD-10-CM | POA: Diagnosis not present

## 2019-10-30 DIAGNOSIS — H409 Unspecified glaucoma: Secondary | ICD-10-CM | POA: Insufficient documentation

## 2019-10-30 DIAGNOSIS — H2589 Other age-related cataract: Secondary | ICD-10-CM | POA: Diagnosis not present

## 2019-10-30 DIAGNOSIS — H401134 Primary open-angle glaucoma, bilateral, indeterminate stage: Secondary | ICD-10-CM | POA: Insufficient documentation

## 2019-10-30 DIAGNOSIS — H401123 Primary open-angle glaucoma, left eye, severe stage: Secondary | ICD-10-CM | POA: Diagnosis not present

## 2019-11-30 DIAGNOSIS — H401123 Primary open-angle glaucoma, left eye, severe stage: Secondary | ICD-10-CM | POA: Diagnosis not present

## 2019-11-30 DIAGNOSIS — H409 Unspecified glaucoma: Secondary | ICD-10-CM | POA: Diagnosis not present

## 2020-01-10 DIAGNOSIS — Z961 Presence of intraocular lens: Secondary | ICD-10-CM | POA: Diagnosis not present

## 2020-01-10 DIAGNOSIS — H4089 Other specified glaucoma: Secondary | ICD-10-CM | POA: Diagnosis not present

## 2020-01-10 DIAGNOSIS — H2589 Other age-related cataract: Secondary | ICD-10-CM | POA: Diagnosis not present

## 2020-01-10 DIAGNOSIS — H401123 Primary open-angle glaucoma, left eye, severe stage: Secondary | ICD-10-CM | POA: Diagnosis not present

## 2020-04-12 IMAGING — RF DG ESOPHAGUS
13 of 15 series · 14 of 24 positions shown · non-contrast
Comparison: None

CLINICAL DATA: Dysphagia, solids and liquids getting stuck in mid
chest for years

EXAM:
ESOPHOGRAM / BARIUM SWALLOW / BARIUM TABLET STUDY
TECHNIQUE: Combined double contrast and single contrast examination performed
using effervescent crystals, thick barium liquid, and thin barium
liquid. The patient was observed with fluoroscopy swallowing a 13 mm
barium sulphate tablet.
FLUOROSCOPY TIME:  Fluoroscopy Time:  3 minutes 30 seconds
Radiation Exposure Index (if provided by the fluoroscopic device):
55.7 mGy
Number of Acquired Spot Images: multiple fluoroscopic screen
captures

[Series 1: cp_standard · 0.18mm/px · 1 of 14 frames shown (1 of 13)]
[frame 1/14]
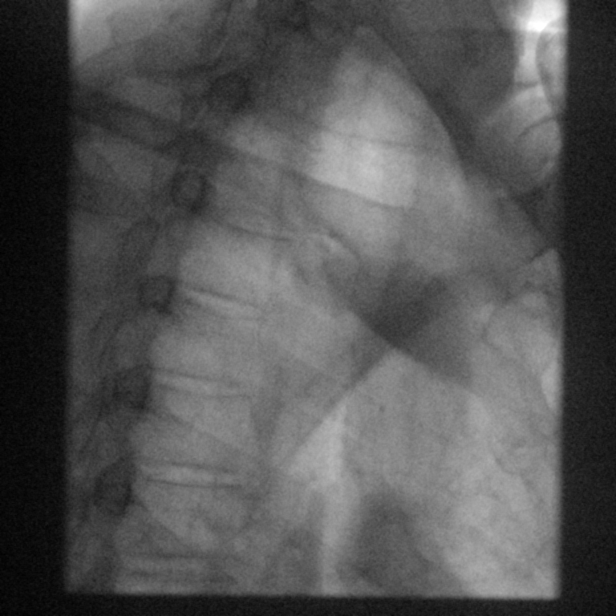

[Series 2: cp_standard · 0.18mm/px · 1 of 99 frames shown (2 of 13)]
[frame 50/99]
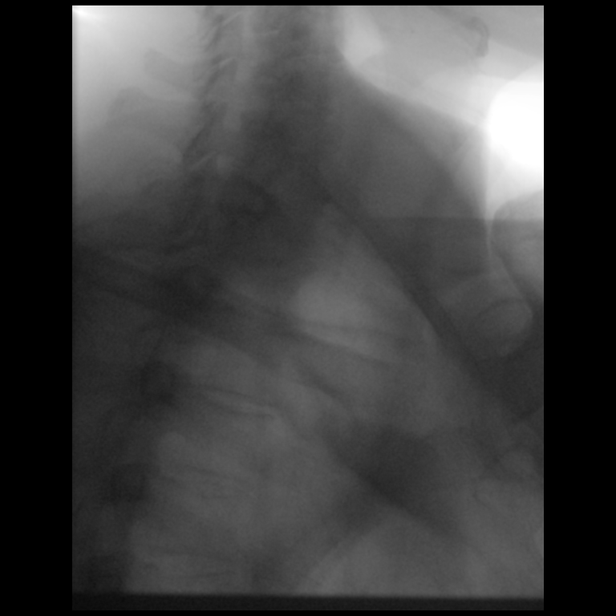

[Series 3: cp_standard · 0.18mm/px · 1 of 100 frames shown (3 of 13)]
[frame 51/100]
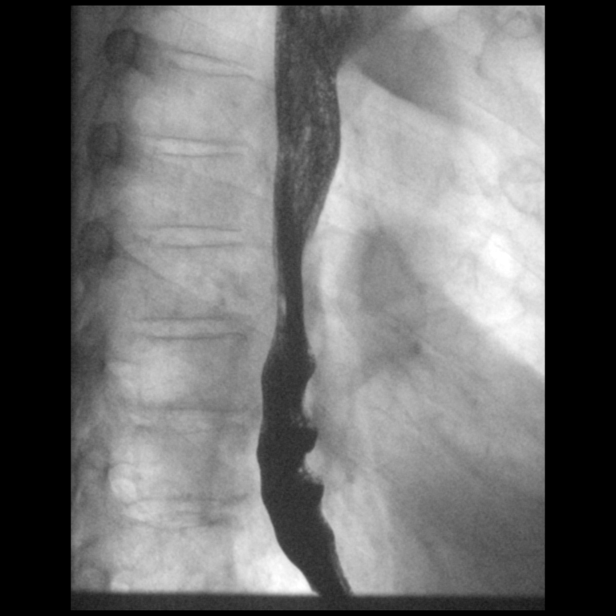

[Series 4: cp_standard · 0.18mm/px · 1 of 140 frames shown (4 of 13)]
[frame 121/140]
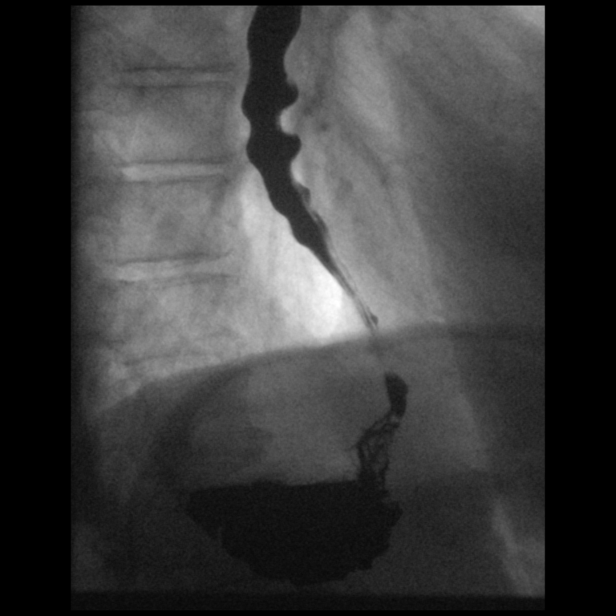

[Series 5: cp_standard · 0.18mm/px · 1 of 47 frames shown (5 of 13)]
[frame 24/47]
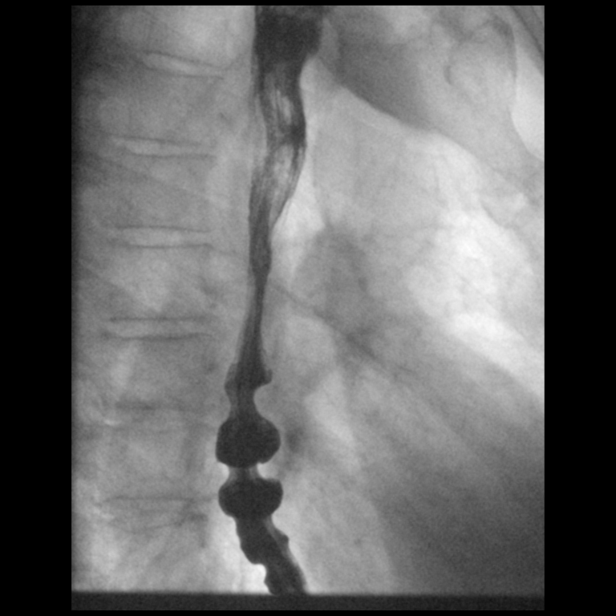

[Series 6: cp_standard · 0.18mm/px · 1 of 114 frames shown (6 of 13)]
[frame 97/114]
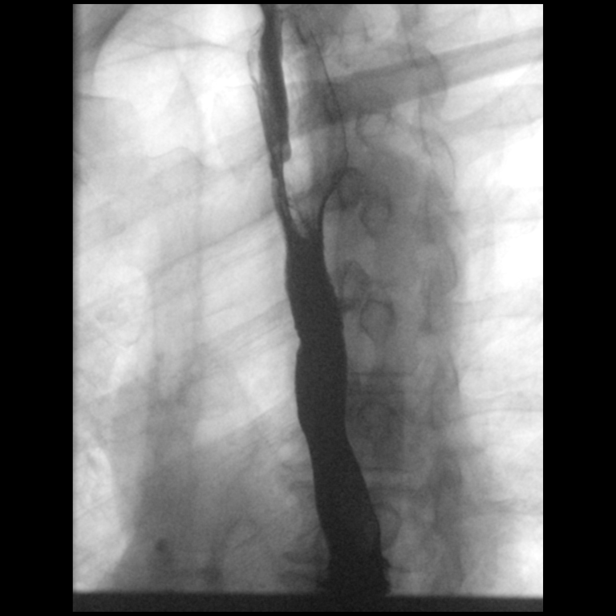

[Series 8: cp_standard · 0.28mm/px · 2 of 168 frames shown (7 of 13)]
[frame 26/168]
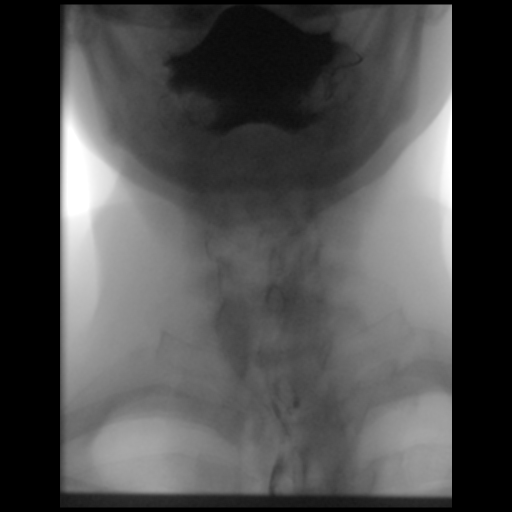
[frame 143/168]
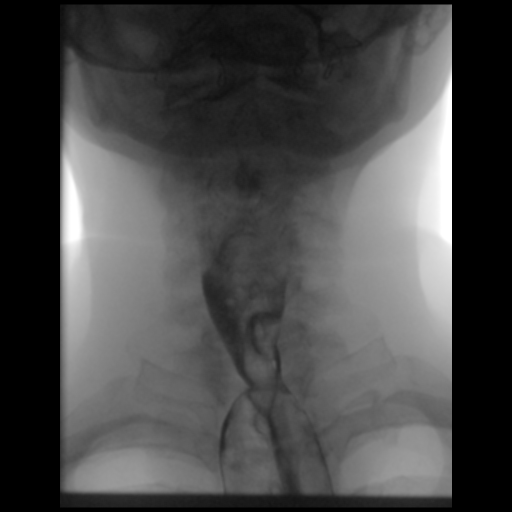

[Series 10: cp_standard · 0.18mm/px · 1 of 32 frames shown (8 of 13)]
[frame 1/32]
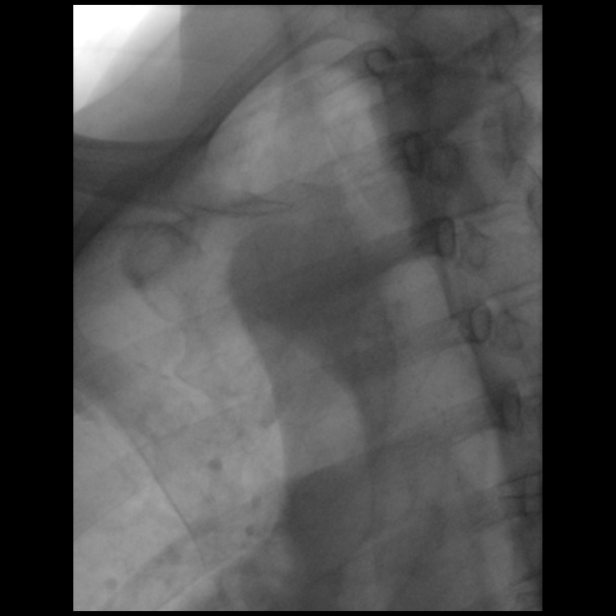

[Series 11: cp_standard · 0.18mm/px · 1 of 31 frames shown (9 of 13)]
[frame 6/31]
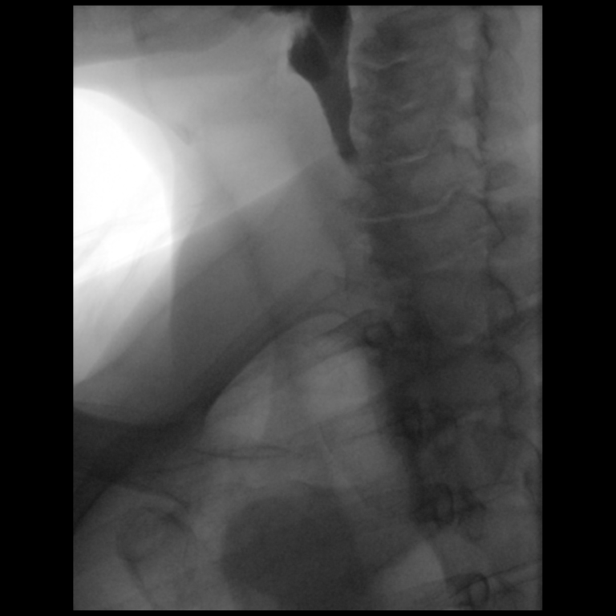

[Series 12: cp_standard · 0.18mm/px · 1 of 24 frames shown (10 of 13)]
[frame 14/24]
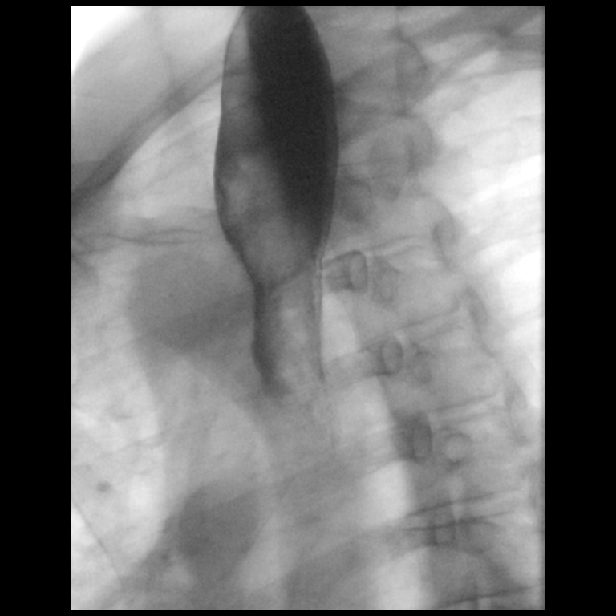

[Series 13: cp_standard · 0.18mm/px · 1 of 17 frames shown (11 of 13)]
[frame 4/17]
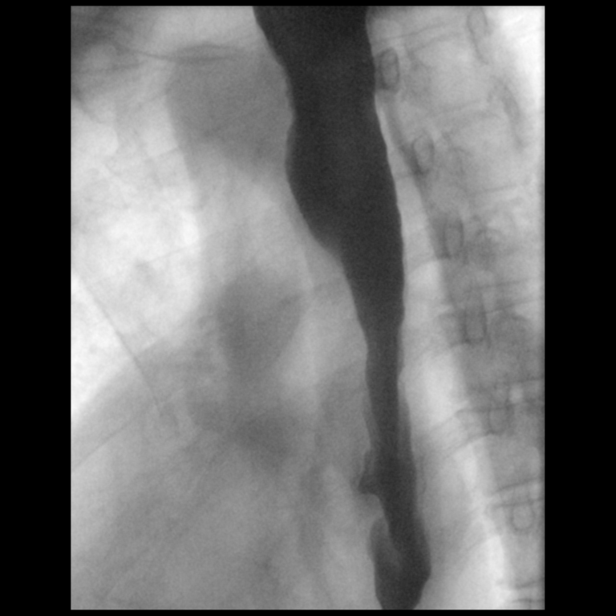

[Series 14: cp_standard · 0.18mm/px · 1 of 159 frames shown (12 of 13)]
[frame 80/159]
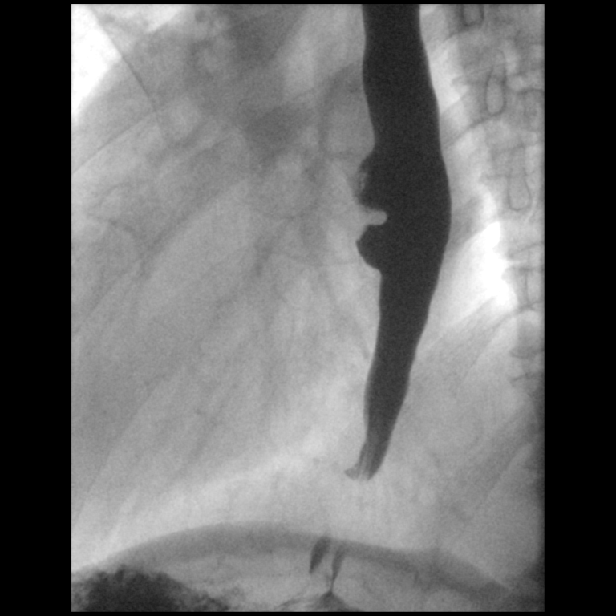

[Series 15: cp_standard · 0.19mm/px · 1 of 178 frames shown (13 of 13)]
[frame 152/178]
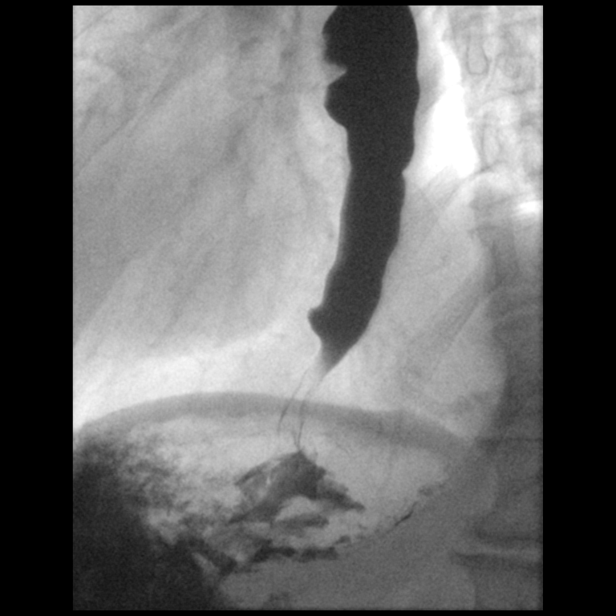

[14 of 24 positions shown; findings below may reference images not displayed]

FINDINGS: Esophageal distention: Narrowing at gastroesophageal junction.
Esophageal distention otherwise normal appearance. No mass
identified.

Filling defects:  No intraluminal filling defects seen

12.5 mm barium tablet: Obstructed at gastroesophageal junction,
which appeared significantly narrower than the tablet. Smooth
margination to the site of narrowing, favor benign stricture. Pill
dissolved in place.

Motility: Severe diffuse esophageal dysmotility with weak primary
peristaltic waves, infrequent and poorly affective secondary waves
and scattered tertiary waves. Intermittent retrograde peristalsis.
Prolonged retention of contrast within thoracic esophagus even with
patient upright.

Mucosa:  Smooth without irregularity or ulceration

Hypopharynx/cervical esophagus: Laryngeal penetration without
aspiration. No significant residuals. An additional area of AP and
transverse narrowing of the cervical esophagus is identified, though
this did not obstruct the 12.5 mm barium tablet.

Hiatal hernia:  Absent

GE reflux:  Not witnessed during exam

Other:  N/A
IMPRESSION: Severely impaired esophageal motility as above.

Stricture at the gastroesophageal junction which obstructed a
mm diameter barium tablet.

Additional narrowing at the cervical esophagus both in AP and
transverse dimensions, though the barium tablet was able to pass
beyond this point.

## 2020-04-22 DIAGNOSIS — Z961 Presence of intraocular lens: Secondary | ICD-10-CM | POA: Diagnosis not present

## 2020-04-22 DIAGNOSIS — H401123 Primary open-angle glaucoma, left eye, severe stage: Secondary | ICD-10-CM | POA: Diagnosis not present

## 2020-04-25 DIAGNOSIS — F1210 Cannabis abuse, uncomplicated: Secondary | ICD-10-CM | POA: Diagnosis not present

## 2020-04-25 DIAGNOSIS — D509 Iron deficiency anemia, unspecified: Secondary | ICD-10-CM | POA: Diagnosis not present

## 2020-04-25 DIAGNOSIS — J449 Chronic obstructive pulmonary disease, unspecified: Secondary | ICD-10-CM | POA: Diagnosis not present

## 2020-04-25 DIAGNOSIS — J439 Emphysema, unspecified: Secondary | ICD-10-CM | POA: Diagnosis not present

## 2020-04-25 DIAGNOSIS — N401 Enlarged prostate with lower urinary tract symptoms: Secondary | ICD-10-CM | POA: Diagnosis not present

## 2020-04-25 DIAGNOSIS — F1410 Cocaine abuse, uncomplicated: Secondary | ICD-10-CM | POA: Diagnosis not present

## 2020-04-25 DIAGNOSIS — N138 Other obstructive and reflux uropathy: Secondary | ICD-10-CM | POA: Diagnosis not present

## 2020-04-25 DIAGNOSIS — E782 Mixed hyperlipidemia: Secondary | ICD-10-CM | POA: Diagnosis not present

## 2020-04-25 DIAGNOSIS — R300 Dysuria: Secondary | ICD-10-CM | POA: Diagnosis not present

## 2020-04-25 DIAGNOSIS — F17210 Nicotine dependence, cigarettes, uncomplicated: Secondary | ICD-10-CM | POA: Diagnosis not present

## 2020-04-25 DIAGNOSIS — R350 Frequency of micturition: Secondary | ICD-10-CM | POA: Diagnosis not present

## 2020-04-25 DIAGNOSIS — I700 Atherosclerosis of aorta: Secondary | ICD-10-CM | POA: Diagnosis not present

## 2020-04-25 DIAGNOSIS — R3589 Other polyuria: Secondary | ICD-10-CM | POA: Diagnosis not present

## 2020-05-02 DIAGNOSIS — Z0100 Encounter for examination of eyes and vision without abnormal findings: Secondary | ICD-10-CM | POA: Diagnosis not present

## 2020-05-02 DIAGNOSIS — H524 Presbyopia: Secondary | ICD-10-CM | POA: Diagnosis not present

## 2020-05-09 DIAGNOSIS — I700 Atherosclerosis of aorta: Secondary | ICD-10-CM | POA: Diagnosis not present

## 2020-05-09 DIAGNOSIS — N200 Calculus of kidney: Secondary | ICD-10-CM | POA: Diagnosis not present

## 2020-05-09 DIAGNOSIS — I2510 Atherosclerotic heart disease of native coronary artery without angina pectoris: Secondary | ICD-10-CM | POA: Diagnosis not present

## 2020-05-09 DIAGNOSIS — J439 Emphysema, unspecified: Secondary | ICD-10-CM | POA: Diagnosis not present

## 2020-05-09 DIAGNOSIS — F17210 Nicotine dependence, cigarettes, uncomplicated: Secondary | ICD-10-CM | POA: Diagnosis not present

## 2020-07-25 ENCOUNTER — Encounter (INDEPENDENT_AMBULATORY_CARE_PROVIDER_SITE_OTHER): Payer: Self-pay | Admitting: *Deleted

## 2020-07-25 DIAGNOSIS — R3589 Other polyuria: Secondary | ICD-10-CM | POA: Diagnosis not present

## 2020-07-25 DIAGNOSIS — Z23 Encounter for immunization: Secondary | ICD-10-CM | POA: Diagnosis not present

## 2020-07-25 DIAGNOSIS — J439 Emphysema, unspecified: Secondary | ICD-10-CM | POA: Diagnosis not present

## 2020-07-25 DIAGNOSIS — J449 Chronic obstructive pulmonary disease, unspecified: Secondary | ICD-10-CM | POA: Diagnosis not present

## 2020-07-25 DIAGNOSIS — Z0001 Encounter for general adult medical examination with abnormal findings: Secondary | ICD-10-CM | POA: Diagnosis not present

## 2020-07-25 DIAGNOSIS — I700 Atherosclerosis of aorta: Secondary | ICD-10-CM | POA: Diagnosis not present

## 2020-07-25 DIAGNOSIS — N138 Other obstructive and reflux uropathy: Secondary | ICD-10-CM | POA: Diagnosis not present

## 2020-08-28 ENCOUNTER — Encounter (INDEPENDENT_AMBULATORY_CARE_PROVIDER_SITE_OTHER): Payer: Self-pay

## 2020-08-28 ENCOUNTER — Other Ambulatory Visit (INDEPENDENT_AMBULATORY_CARE_PROVIDER_SITE_OTHER): Payer: Self-pay

## 2020-08-28 ENCOUNTER — Telehealth (INDEPENDENT_AMBULATORY_CARE_PROVIDER_SITE_OTHER): Payer: Self-pay

## 2020-08-28 DIAGNOSIS — Z1211 Encounter for screening for malignant neoplasm of colon: Secondary | ICD-10-CM

## 2020-08-28 NOTE — Telephone Encounter (Signed)
Referring MD/PCP: Burdine  Procedure: Tcs  Reason/Indication:  Screening   Has patient had this procedure before?  ?  If so, when, by whom and where?    Is there a family history of colon cancer?  no  Who?  What age when diagnosed?    Is patient diabetic? If yes, Type 1 or Type 2   no      Does patient have prosthetic heart valve or mechanical valve?  no  Do you have a pacemaker/defibrillator?  no  Has patient ever had endocarditis/atrial fibrillation? no  Does patient use oxygen? no  Has patient had joint replacement within last 12 months?  no  Is patient constipated or do they take laxatives? Yes, Constipation  Does patient have a history of alcohol/drug use?  no  Have you had a stroke/heart attack last 6 mths? no  Do you take medicine for weight loss?  no  For male patients,: do you still have your menstrual cycle? n/a  Is patient on blood thinner such as Coumadin, Plavix and/or Aspirin? no  Medications: none  Allergies: nkda  Medication Adjustment per Dr Laural Golden: none  Procedure date & time: 09/05/20 7:30

## 2020-08-28 NOTE — Patient Instructions (Signed)
Your procedure is scheduled on: 6/152022  Report to [facility] main entrance  at    7:30 AM.  Call this number if you have problems the morning of surgery: [phone]   Remember:              Follow Directions on the letter you received from Your Physician's office regarding the Bowel Prep              No Smoking the day of Procedure :   Take these medicines the morning of surgery with A SIP OF WATER: Pantoprazole   Do not wear jewelry, make-up or nail polish.    Do not bring valuables to the hospital.  Contacts, dentures or bridgework may not be worn into surgery.  .   Patients discharged the day of surgery will not be allowed to drive home.     Colonoscopy, Adult, Care After This sheet gives you information about how to care for yourself after your procedure. Your health care provider may also give you more specific instructions. If you have problems or questions, contact your health care provider. What can I expect after the procedure? After the procedure, it is common to have:  A small amount of blood in your stool for 24 hours after the procedure.  Some gas.  Mild abdominal cramping or bloating.  Follow these instructions at home: General instructions   For the first 24 hours after the procedure: ? Do not drive or use machinery. ? Do not sign important documents. ? Do not drink alcohol. ? Do your regular daily activities at a slower pace than normal. ? Eat soft, easy-to-digest foods. ? Rest often.  Take over-the-counter or prescription medicines only as told by your health care provider.  It is up to you to get the results of your procedure. Ask your health care provider, or the department performing the procedure, when your results will be ready. Relieving cramping and bloating  Try walking around when you have cramps or feel bloated.  Apply heat to your abdomen as told by your health care provider. Use a heat source that your health care provider  recommends, such as a moist heat pack or a heating pad. ? Place a towel between your skin and the heat source. ? Leave the heat on for 20-30 minutes. ? Remove the heat if your skin turns bright red. This is especially important if you are unable to feel pain, heat, or cold. You may have a greater risk of getting burned. Eating and drinking  Drink enough fluid to keep your urine clear or pale yellow.  Resume your normal diet as instructed by your health care provider. Avoid heavy or fried foods that are hard to digest.  Avoid drinking alcohol for as long as instructed by your health care provider. Contact a health care provider if:  You have blood in your stool 2-3 days after the procedure. Get help right away if:  You have more than a small spotting of blood in your stool.  You pass large blood clots in your stool.  Your abdomen is swollen.  You have nausea or vomiting.  You have a fever.  You have increasing abdominal pain that is not relieved with medicine. This information is not intended to replace advice given to you by your health care provider. Make sure you discuss any questions you have with your health care provider. Document Released: 10/22/2003 Document Revised: 12/02/2015 Document Reviewed: 05/21/2015 Elsevier Interactive Patient Education  2018  [employer].

## 2020-08-29 ENCOUNTER — Telehealth (INDEPENDENT_AMBULATORY_CARE_PROVIDER_SITE_OTHER): Payer: Self-pay

## 2020-08-29 ENCOUNTER — Encounter (INDEPENDENT_AMBULATORY_CARE_PROVIDER_SITE_OTHER): Payer: Self-pay

## 2020-08-29 MED ORDER — PEG 3350-KCL-NA BICARB-NACL 420 G PO SOLR: 4000.0000 mL | ORAL | 0 refills | Status: DC

## 2020-08-29 NOTE — Telephone Encounter (Signed)
Ian Boyd, CMA  

## 2020-08-30 ENCOUNTER — Other Ambulatory Visit (INDEPENDENT_AMBULATORY_CARE_PROVIDER_SITE_OTHER): Payer: Self-pay

## 2020-09-02 ENCOUNTER — Encounter (HOSPITAL_COMMUNITY): Payer: Self-pay

## 2020-09-02 ENCOUNTER — Encounter (HOSPITAL_COMMUNITY)
Admission: RE | Admit: 2020-09-02 | Discharge: 2020-09-02 | Disposition: A | Discharge status: Home / Self Care | Payer: Medicare Other | Source: Ambulatory Visit | Attending: Internal Medicine | Admitting: Internal Medicine

## 2020-09-02 ENCOUNTER — Other Ambulatory Visit: Payer: Self-pay

## 2020-09-02 DIAGNOSIS — Z1211 Encounter for screening for malignant neoplasm of colon: Secondary | ICD-10-CM

## 2020-09-02 HISTORY — DX: Gastro-esophageal reflux disease without esophagitis: K21.9

## 2020-09-04 ENCOUNTER — Ambulatory Visit (HOSPITAL_COMMUNITY): Admission: RE | Admit: 2020-09-04 | Payer: Medicare Other | Source: Home / Self Care | Admitting: Internal Medicine

## 2020-09-04 ENCOUNTER — Encounter (HOSPITAL_COMMUNITY): Admission: RE | Payer: Self-pay | Source: Home / Self Care

## 2020-09-04 SURGERY — COLONOSCOPY WITH PROPOFOL
Anesthesia: Monitor Anesthesia Care

## 2020-09-05 ENCOUNTER — Encounter (HOSPITAL_COMMUNITY): Payer: Self-pay

## 2020-09-05 ENCOUNTER — Ambulatory Visit (HOSPITAL_COMMUNITY): Admit: 2020-09-05 | Payer: Medicare Other | Admitting: Internal Medicine

## 2020-09-05 SURGERY — COLONOSCOPY
Anesthesia: Moderate Sedation

## 2020-09-10 DIAGNOSIS — N3000 Acute cystitis without hematuria: Secondary | ICD-10-CM | POA: Diagnosis not present

## 2020-09-10 DIAGNOSIS — R3914 Feeling of incomplete bladder emptying: Secondary | ICD-10-CM | POA: Diagnosis not present

## 2020-09-10 DIAGNOSIS — R350 Frequency of micturition: Secondary | ICD-10-CM | POA: Diagnosis not present

## 2020-09-16 DIAGNOSIS — N3000 Acute cystitis without hematuria: Secondary | ICD-10-CM | POA: Diagnosis not present

## 2021-01-02 DIAGNOSIS — J439 Emphysema, unspecified: Secondary | ICD-10-CM | POA: Diagnosis not present

## 2021-01-02 DIAGNOSIS — F17210 Nicotine dependence, cigarettes, uncomplicated: Secondary | ICD-10-CM | POA: Diagnosis not present

## 2021-01-02 DIAGNOSIS — J449 Chronic obstructive pulmonary disease, unspecified: Secondary | ICD-10-CM | POA: Diagnosis not present

## 2021-01-20 DIAGNOSIS — R1310 Dysphagia, unspecified: Secondary | ICD-10-CM | POA: Diagnosis not present

## 2021-01-20 DIAGNOSIS — J449 Chronic obstructive pulmonary disease, unspecified: Secondary | ICD-10-CM | POA: Diagnosis not present

## 2021-01-23 DIAGNOSIS — F17210 Nicotine dependence, cigarettes, uncomplicated: Secondary | ICD-10-CM | POA: Diagnosis not present

## 2021-01-23 DIAGNOSIS — I700 Atherosclerosis of aorta: Secondary | ICD-10-CM | POA: Diagnosis not present

## 2021-01-23 DIAGNOSIS — J439 Emphysema, unspecified: Secondary | ICD-10-CM | POA: Diagnosis not present

## 2021-01-23 DIAGNOSIS — Z681 Body mass index (BMI) 19 or less, adult: Secondary | ICD-10-CM | POA: Diagnosis not present

## 2021-01-23 DIAGNOSIS — J449 Chronic obstructive pulmonary disease, unspecified: Secondary | ICD-10-CM | POA: Diagnosis not present

## 2021-03-21 DIAGNOSIS — J449 Chronic obstructive pulmonary disease, unspecified: Secondary | ICD-10-CM | POA: Diagnosis not present

## 2021-03-21 DIAGNOSIS — R1310 Dysphagia, unspecified: Secondary | ICD-10-CM | POA: Diagnosis not present

## 2021-04-20 DIAGNOSIS — R1310 Dysphagia, unspecified: Secondary | ICD-10-CM | POA: Diagnosis not present

## 2021-04-20 DIAGNOSIS — J449 Chronic obstructive pulmonary disease, unspecified: Secondary | ICD-10-CM | POA: Diagnosis not present

## 2021-05-05 DIAGNOSIS — H401133 Primary open-angle glaucoma, bilateral, severe stage: Secondary | ICD-10-CM | POA: Diagnosis not present

## 2021-05-14 DIAGNOSIS — L299 Pruritus, unspecified: Secondary | ICD-10-CM | POA: Diagnosis not present

## 2021-05-14 DIAGNOSIS — D509 Iron deficiency anemia, unspecified: Secondary | ICD-10-CM | POA: Diagnosis not present

## 2021-05-14 DIAGNOSIS — Z13220 Encounter for screening for lipoid disorders: Secondary | ICD-10-CM | POA: Diagnosis not present

## 2021-05-14 DIAGNOSIS — E785 Hyperlipidemia, unspecified: Secondary | ICD-10-CM | POA: Diagnosis not present

## 2021-05-14 DIAGNOSIS — J449 Chronic obstructive pulmonary disease, unspecified: Secondary | ICD-10-CM | POA: Diagnosis not present

## 2021-05-21 DIAGNOSIS — S46211A Strain of muscle, fascia and tendon of other parts of biceps, right arm, initial encounter: Secondary | ICD-10-CM | POA: Diagnosis not present

## 2021-05-21 DIAGNOSIS — D170 Benign lipomatous neoplasm of skin and subcutaneous tissue of head, face and neck: Secondary | ICD-10-CM | POA: Diagnosis not present

## 2021-05-21 DIAGNOSIS — J449 Chronic obstructive pulmonary disease, unspecified: Secondary | ICD-10-CM | POA: Diagnosis not present

## 2021-05-21 DIAGNOSIS — I700 Atherosclerosis of aorta: Secondary | ICD-10-CM | POA: Diagnosis not present

## 2021-05-21 DIAGNOSIS — J439 Emphysema, unspecified: Secondary | ICD-10-CM | POA: Diagnosis not present

## 2021-05-21 DIAGNOSIS — F17210 Nicotine dependence, cigarettes, uncomplicated: Secondary | ICD-10-CM | POA: Diagnosis not present

## 2021-05-29 DIAGNOSIS — J439 Emphysema, unspecified: Secondary | ICD-10-CM | POA: Diagnosis not present

## 2021-05-29 DIAGNOSIS — I2510 Atherosclerotic heart disease of native coronary artery without angina pectoris: Secondary | ICD-10-CM | POA: Diagnosis not present

## 2021-05-29 DIAGNOSIS — I700 Atherosclerosis of aorta: Secondary | ICD-10-CM | POA: Diagnosis not present

## 2021-05-29 DIAGNOSIS — F17210 Nicotine dependence, cigarettes, uncomplicated: Secondary | ICD-10-CM | POA: Diagnosis not present

## 2021-05-29 DIAGNOSIS — N200 Calculus of kidney: Secondary | ICD-10-CM | POA: Diagnosis not present

## 2021-06-04 DIAGNOSIS — M75121 Complete rotator cuff tear or rupture of right shoulder, not specified as traumatic: Secondary | ICD-10-CM | POA: Diagnosis not present

## 2021-06-04 DIAGNOSIS — S46211A Strain of muscle, fascia and tendon of other parts of biceps, right arm, initial encounter: Secondary | ICD-10-CM | POA: Diagnosis not present

## 2021-06-19 DIAGNOSIS — M25511 Pain in right shoulder: Secondary | ICD-10-CM | POA: Diagnosis not present

## 2021-06-19 DIAGNOSIS — F17200 Nicotine dependence, unspecified, uncomplicated: Secondary | ICD-10-CM | POA: Diagnosis not present

## 2021-06-21 DIAGNOSIS — K6812 Psoas muscle abscess: Secondary | ICD-10-CM

## 2021-06-21 DIAGNOSIS — A419 Sepsis, unspecified organism: Secondary | ICD-10-CM

## 2021-06-21 HISTORY — DX: Psoas muscle abscess: K68.12

## 2021-06-21 HISTORY — DX: Sepsis, unspecified organism: A41.9

## 2021-06-25 ENCOUNTER — Other Ambulatory Visit: Payer: Self-pay

## 2021-06-25 ENCOUNTER — Emergency Department (HOSPITAL_COMMUNITY): Payer: Medicare Other

## 2021-06-25 ENCOUNTER — Inpatient Hospital Stay (HOSPITAL_COMMUNITY)
Admission: EM | Admit: 2021-06-25 | Discharge: 2021-07-04 | DRG: 871 | Disposition: A | Discharge status: Skilled Nursing Facility | Payer: Medicare Other | Attending: Internal Medicine | Admitting: Internal Medicine

## 2021-06-25 ENCOUNTER — Encounter (HOSPITAL_COMMUNITY): Payer: Self-pay

## 2021-06-25 DIAGNOSIS — R54 Age-related physical debility: Secondary | ICD-10-CM | POA: Diagnosis present

## 2021-06-25 DIAGNOSIS — N210 Calculus in bladder: Secondary | ICD-10-CM | POA: Diagnosis not present

## 2021-06-25 DIAGNOSIS — F17210 Nicotine dependence, cigarettes, uncomplicated: Secondary | ICD-10-CM | POA: Diagnosis present

## 2021-06-25 DIAGNOSIS — K6812 Psoas muscle abscess: Secondary | ICD-10-CM | POA: Diagnosis not present

## 2021-06-25 DIAGNOSIS — M12811 Other specific arthropathies, not elsewhere classified, right shoulder: Secondary | ICD-10-CM | POA: Diagnosis not present

## 2021-06-25 DIAGNOSIS — E871 Hypo-osmolality and hyponatremia: Secondary | ICD-10-CM

## 2021-06-25 DIAGNOSIS — R29898 Other symptoms and signs involving the musculoskeletal system: Secondary | ICD-10-CM | POA: Diagnosis not present

## 2021-06-25 DIAGNOSIS — B1920 Unspecified viral hepatitis C without hepatic coma: Secondary | ICD-10-CM | POA: Diagnosis present

## 2021-06-25 DIAGNOSIS — Z751 Person awaiting admission to adequate facility elsewhere: Secondary | ICD-10-CM

## 2021-06-25 DIAGNOSIS — A419 Sepsis, unspecified organism: Secondary | ICD-10-CM | POA: Diagnosis not present

## 2021-06-25 DIAGNOSIS — M75101 Unspecified rotator cuff tear or rupture of right shoulder, not specified as traumatic: Secondary | ICD-10-CM | POA: Diagnosis present

## 2021-06-25 DIAGNOSIS — Z681 Body mass index (BMI) 19 or less, adult: Secondary | ICD-10-CM

## 2021-06-25 DIAGNOSIS — Z8249 Family history of ischemic heart disease and other diseases of the circulatory system: Secondary | ICD-10-CM | POA: Diagnosis not present

## 2021-06-25 DIAGNOSIS — R7881 Bacteremia: Secondary | ICD-10-CM | POA: Diagnosis not present

## 2021-06-25 DIAGNOSIS — R0602 Shortness of breath: Secondary | ICD-10-CM | POA: Diagnosis not present

## 2021-06-25 DIAGNOSIS — K219 Gastro-esophageal reflux disease without esophagitis: Secondary | ICD-10-CM | POA: Diagnosis not present

## 2021-06-25 DIAGNOSIS — B182 Chronic viral hepatitis C: Secondary | ICD-10-CM | POA: Diagnosis not present

## 2021-06-25 DIAGNOSIS — N132 Hydronephrosis with renal and ureteral calculous obstruction: Secondary | ICD-10-CM | POA: Diagnosis not present

## 2021-06-25 DIAGNOSIS — Z79899 Other long term (current) drug therapy: Secondary | ICD-10-CM | POA: Diagnosis not present

## 2021-06-25 DIAGNOSIS — N200 Calculus of kidney: Secondary | ICD-10-CM | POA: Insufficient documentation

## 2021-06-25 DIAGNOSIS — M75100 Unspecified rotator cuff tear or rupture of unspecified shoulder, not specified as traumatic: Secondary | ICD-10-CM | POA: Diagnosis present

## 2021-06-25 DIAGNOSIS — M79601 Pain in right arm: Secondary | ICD-10-CM | POA: Diagnosis not present

## 2021-06-25 DIAGNOSIS — B965 Pseudomonas (aeruginosa) (mallei) (pseudomallei) as the cause of diseases classified elsewhere: Secondary | ICD-10-CM | POA: Insufficient documentation

## 2021-06-25 DIAGNOSIS — F1410 Cocaine abuse, uncomplicated: Secondary | ICD-10-CM | POA: Diagnosis present

## 2021-06-25 DIAGNOSIS — N179 Acute kidney failure, unspecified: Secondary | ICD-10-CM | POA: Diagnosis not present

## 2021-06-25 DIAGNOSIS — M6281 Muscle weakness (generalized): Secondary | ICD-10-CM | POA: Diagnosis not present

## 2021-06-25 DIAGNOSIS — H5461 Unqualified visual loss, right eye, normal vision left eye: Secondary | ICD-10-CM | POA: Diagnosis present

## 2021-06-25 DIAGNOSIS — R112 Nausea with vomiting, unspecified: Secondary | ICD-10-CM | POA: Diagnosis not present

## 2021-06-25 DIAGNOSIS — A4151 Sepsis due to Escherichia coli [E. coli]: Principal | ICD-10-CM | POA: Diagnosis present

## 2021-06-25 DIAGNOSIS — E876 Hypokalemia: Secondary | ICD-10-CM | POA: Diagnosis not present

## 2021-06-25 DIAGNOSIS — N201 Calculus of ureter: Secondary | ICD-10-CM | POA: Diagnosis not present

## 2021-06-25 DIAGNOSIS — R6520 Severe sepsis without septic shock: Secondary | ICD-10-CM | POA: Diagnosis not present

## 2021-06-25 DIAGNOSIS — M62511 Muscle wasting and atrophy, not elsewhere classified, right shoulder: Secondary | ICD-10-CM | POA: Diagnosis not present

## 2021-06-25 DIAGNOSIS — N401 Enlarged prostate with lower urinary tract symptoms: Secondary | ICD-10-CM | POA: Diagnosis present

## 2021-06-25 DIAGNOSIS — R109 Unspecified abdominal pain: Secondary | ICD-10-CM | POA: Diagnosis not present

## 2021-06-25 DIAGNOSIS — F1490 Cocaine use, unspecified, uncomplicated: Secondary | ICD-10-CM | POA: Diagnosis present

## 2021-06-25 DIAGNOSIS — M12819 Other specific arthropathies, not elsewhere classified, unspecified shoulder: Secondary | ICD-10-CM | POA: Diagnosis present

## 2021-06-25 DIAGNOSIS — R197 Diarrhea, unspecified: Secondary | ICD-10-CM | POA: Diagnosis not present

## 2021-06-25 DIAGNOSIS — N323 Diverticulum of bladder: Secondary | ICD-10-CM | POA: Diagnosis present

## 2021-06-25 DIAGNOSIS — M40202 Unspecified kyphosis, cervical region: Secondary | ICD-10-CM | POA: Diagnosis not present

## 2021-06-25 DIAGNOSIS — N1330 Unspecified hydronephrosis: Secondary | ICD-10-CM | POA: Diagnosis not present

## 2021-06-25 DIAGNOSIS — N390 Urinary tract infection, site not specified: Secondary | ICD-10-CM | POA: Diagnosis present

## 2021-06-25 DIAGNOSIS — R2681 Unsteadiness on feet: Secondary | ICD-10-CM | POA: Diagnosis not present

## 2021-06-25 DIAGNOSIS — D696 Thrombocytopenia, unspecified: Secondary | ICD-10-CM | POA: Diagnosis not present

## 2021-06-25 DIAGNOSIS — B9620 Unspecified Escherichia coli [E. coli] as the cause of diseases classified elsewhere: Secondary | ICD-10-CM | POA: Diagnosis not present

## 2021-06-25 DIAGNOSIS — R1310 Dysphagia, unspecified: Secondary | ICD-10-CM | POA: Diagnosis not present

## 2021-06-25 DIAGNOSIS — M47812 Spondylosis without myelopathy or radiculopathy, cervical region: Secondary | ICD-10-CM | POA: Diagnosis not present

## 2021-06-25 DIAGNOSIS — M75121 Complete rotator cuff tear or rupture of right shoulder, not specified as traumatic: Secondary | ICD-10-CM | POA: Diagnosis not present

## 2021-06-25 DIAGNOSIS — G959 Disease of spinal cord, unspecified: Secondary | ICD-10-CM | POA: Diagnosis not present

## 2021-06-25 DIAGNOSIS — Z7401 Bed confinement status: Secondary | ICD-10-CM | POA: Diagnosis not present

## 2021-06-25 DIAGNOSIS — M19011 Primary osteoarthritis, right shoulder: Secondary | ICD-10-CM | POA: Diagnosis not present

## 2021-06-25 LAB — CBC WITH DIFFERENTIAL/PLATELET
Band Neutrophils: 11 %
Basophils Absolute: 0 10*3/uL (ref 0.0–0.1)
Basophils Relative: 0 %
Eosinophils Absolute: 0 10*3/uL (ref 0.0–0.5)
Eosinophils Relative: 0 %
HCT: 34.1 % — ABNORMAL LOW (ref 39.0–52.0)
Hemoglobin: 11.7 g/dL — ABNORMAL LOW (ref 13.0–17.0)
Lymphocytes Relative: 2 %
Lymphs Abs: 1.1 10*3/uL (ref 0.7–4.0)
MCH: 27.9 pg (ref 26.0–34.0)
MCHC: 34.3 g/dL (ref 30.0–36.0)
MCV: 81.4 fL (ref 80.0–100.0)
Monocytes Absolute: 1.1 10*3/uL — ABNORMAL HIGH (ref 0.1–1.0)
Monocytes Relative: 2 %
Neutro Abs: 52.8 10*3/uL — ABNORMAL HIGH (ref 1.7–7.7)
Neutrophils Relative %: 85 %
Platelets: 88 10*3/uL — ABNORMAL LOW (ref 150–400)
RBC: 4.19 MIL/uL — ABNORMAL LOW (ref 4.22–5.81)
RDW: 15.2 % (ref 11.5–15.5)
WBC: 55 10*3/uL (ref 4.0–10.5)
nRBC: 0 % (ref 0.0–0.2)

## 2021-06-25 LAB — COMPREHENSIVE METABOLIC PANEL
ALT: 24 U/L (ref 0–44)
AST: 47 U/L — ABNORMAL HIGH (ref 15–41)
Albumin: 2.2 g/dL — ABNORMAL LOW (ref 3.5–5.0)
Alkaline Phosphatase: 192 U/L — ABNORMAL HIGH (ref 38–126)
Anion gap: 14 (ref 5–15)
BUN: 61 mg/dL — ABNORMAL HIGH (ref 8–23)
CO2: 20 mmol/L — ABNORMAL LOW (ref 22–32)
Calcium: 8.2 mg/dL — ABNORMAL LOW (ref 8.9–10.3)
Chloride: 102 mmol/L (ref 98–111)
Creatinine, Ser: 2.9 mg/dL — ABNORMAL HIGH (ref 0.61–1.24)
GFR, Estimated: 22 mL/min — ABNORMAL LOW (ref >60)
Glucose, Bld: 106 mg/dL — ABNORMAL HIGH (ref 70–99)
Potassium: 3.9 mmol/L (ref 3.5–5.1)
Sodium: 136 mmol/L (ref 135–145)
Total Bilirubin: 5.1 mg/dL — ABNORMAL HIGH (ref 0.3–1.2)
Total Protein: 6.7 g/dL (ref 6.5–8.1)

## 2021-06-25 LAB — RAPID URINE DRUG SCREEN, HOSP PERFORMED
Amphetamines: NOT DETECTED
Barbiturates: NOT DETECTED
Benzodiazepines: NOT DETECTED
Cocaine: POSITIVE — AB
Opiates: NOT DETECTED
Tetrahydrocannabinol: POSITIVE — AB

## 2021-06-25 LAB — URINALYSIS, ROUTINE W REFLEX MICROSCOPIC
Bilirubin Urine: NEGATIVE
Glucose, UA: NEGATIVE mg/dL
Ketones, ur: NEGATIVE mg/dL
Nitrite: NEGATIVE
Protein, ur: 30 mg/dL — AB
Specific Gravity, Urine: 1.012 (ref 1.005–1.030)
WBC, UA: >50 WBC/hpf — ABNORMAL HIGH (ref 0–5)
pH: 5 (ref 5.0–8.0)

## 2021-06-25 LAB — TROPONIN I (HIGH SENSITIVITY)
Troponin I (High Sensitivity): 66 ng/L — ABNORMAL HIGH (ref <18)
Troponin I (High Sensitivity): 42 ng/L — ABNORMAL HIGH (ref <18)

## 2021-06-25 LAB — LIPASE, BLOOD: Lipase: 19 U/L (ref 11–51)

## 2021-06-25 LAB — LACTIC ACID, PLASMA: Lactic Acid, Venous: 1.8 mmol/L (ref 0.5–1.9)

## 2021-06-25 MED ORDER — SODIUM CHLORIDE 0.9 % IV BOLUS
1000.0000 mL | Freq: Once | INTRAVENOUS | Status: AC
  Administered 2021-06-25: 1000 mL via INTRAVENOUS

## 2021-06-25 MED ORDER — ONDANSETRON HCL 4 MG/2ML IJ SOLN
4.0000 mg | Freq: Four times a day (QID) | INTRAMUSCULAR | Status: DC | PRN
Start: 2021-06-25 — End: 2021-07-04

## 2021-06-25 MED ORDER — ACETAMINOPHEN 325 MG PO TABS
650.0000 mg | ORAL_TABLET | Freq: Four times a day (QID) | ORAL | Status: DC | PRN
  Administered 2021-06-26 – 2021-07-04 (×8): 650 mg via ORAL
  Filled 2021-06-25 (×8): qty 2

## 2021-06-25 MED ORDER — ALBUTEROL SULFATE (2.5 MG/3ML) 0.083% IN NEBU: 3.0000 mL | INHALATION_SOLUTION | RESPIRATORY_TRACT | Status: DC | PRN

## 2021-06-25 MED ORDER — ACETAMINOPHEN 325 MG PO TABS
650.0000 mg | ORAL_TABLET | Freq: Four times a day (QID) | ORAL | Status: DC | PRN
  Administered 2021-06-25: 650 mg via ORAL
  Filled 2021-06-25: qty 2

## 2021-06-25 MED ORDER — VANCOMYCIN HCL 1250 MG/250ML IV SOLN
1250.0000 mg | Freq: Once | INTRAVENOUS | Status: AC
  Administered 2021-06-25: 1250 mg via INTRAVENOUS
  Filled 2021-06-25: qty 250

## 2021-06-25 MED ORDER — METRONIDAZOLE 500 MG/100ML IV SOLN
500.0000 mg | Freq: Once | INTRAVENOUS | Status: AC
  Administered 2021-06-25: 500 mg via INTRAVENOUS
  Filled 2021-06-25: qty 100

## 2021-06-25 MED ORDER — SODIUM CHLORIDE 0.9 % IV SOLN: 2.0000 g | INTRAVENOUS | Status: DC

## 2021-06-25 MED ORDER — TAMSULOSIN HCL 0.4 MG PO CAPS
0.4000 mg | ORAL_CAPSULE | Freq: Every day | ORAL | Status: DC
  Administered 2021-06-26 – 2021-07-03 (×8): 0.4 mg via ORAL
  Filled 2021-06-25 (×8): qty 1

## 2021-06-25 MED ORDER — METRONIDAZOLE 500 MG/100ML IV SOLN
500.0000 mg | Freq: Two times a day (BID) | INTRAVENOUS | Status: DC
  Administered 2021-06-26: 500 mg via INTRAVENOUS
  Filled 2021-06-25: qty 100

## 2021-06-25 MED ORDER — LACTATED RINGERS IV SOLN: INTRAVENOUS | Status: DC

## 2021-06-25 MED ORDER — SODIUM CHLORIDE 0.9 % IV SOLN
2.0000 g | Freq: Once | INTRAVENOUS | Status: AC
  Administered 2021-06-25: 2 g via INTRAVENOUS
  Filled 2021-06-25: qty 12.5

## 2021-06-25 MED ORDER — ONDANSETRON HCL 4 MG PO TABS: 4.0000 mg | ORAL_TABLET | Freq: Four times a day (QID) | ORAL | Status: DC | PRN

## 2021-06-25 MED ORDER — ACETAMINOPHEN 650 MG RE SUPP: 650.0000 mg | Freq: Four times a day (QID) | RECTAL | Status: DC | PRN

## 2021-06-25 NOTE — ED Notes (Signed)
Patient transported to MRI 

## 2021-06-25 NOTE — Assessment & Plan Note (Addendum)
CT findings concerning for right psoas abscess vs hematoma. Drain placed by IR on 4/6 with preliminary culture significant for gram negative rods ?-Continue drain per IR ?-Continue abx-- ID consult ?-wound culture- ecoli ?

## 2021-06-25 NOTE — Sepsis Progress Note (Signed)
ELink tracking the Code Sepsis. 

## 2021-06-25 NOTE — ED Triage Notes (Signed)
Family reports patient has been sick with n/v/d sob inability to move right arm, difficulty walking.  RN having a hard time understanding what patient is saying.  Patient family reports he can't move his right arm at all so she put it in a sling.  Reports he went to [facility] and they gave him some medications and he has been worse since.  ?

## 2021-06-25 NOTE — Assessment & Plan Note (Addendum)
Secondary to bacteremia, psoas abscess and UTI. Physiology resolved. ?

## 2021-06-25 NOTE — Consult Note (Signed)
Pharmacy Antibiotic Note ? ?Ian Boyd is a 71 y.o. male admitted on 06/25/2021 with  intra-abdominal infection .  Pharmacy has been consulted for vancomycin dosing. ? ?Plan: ?Vancomycin '1250mg'$  x 1 one dose now.  Further dosing to follow upon transfer to [facility] as no  ?additional dose will be due for 36-48hrs based on Scr=2.9 ? ?Height: 6' (182.9 cm) ?Weight: 56.7 kg (125 lb) ?IBW/kg (Calculated) : 77.6 ? ?Temp (24hrs), Avg:99.2 ?F (37.3 ?C), Min:97.9 ?F (36.6 ?C), Max:100.5 ?F (38.1 ?C) ? ?Recent Labs  ?Lab 06/25/21 ?1317 06/25/21 ?1427  ?WBC 55.0*  --   ?CREATININE 2.90*  --   ?LATICACIDVEN  --  1.8  ?  ?Estimated Creatinine Clearance: 18.2 mL/min (A) (by C-G formula based on SCr of 2.9 mg/dL (H)).   ? ?No Known Allergies ? ?Antimicrobials this admission: ?/04/05 Cefepime 2gm >>  ?04/05 Metronidazole '500mg'$  IV >>  ?04/05 Vancomycin '1250mg'$  IV>> ? ?Thank you for allowing pharmacy to be a part of this patient?s care. ? ?Berta Minor, RPh ?06/25/2021 7:59 PM ? ?

## 2021-06-25 NOTE — Assessment & Plan Note (Addendum)
X-ray suggesting chronic rotator cuff injury.  Reports right shoulder problems for about 3 years now although now reports new right shoulder mass. ?-OT ?-MRI confirms known rotator cuff tear-- doubt any intervention would be considered until his infection has cleared ?

## 2021-06-25 NOTE — ED Provider Notes (Signed)
?[facility] ?Provider Note ? ? ?CSN: [id number] ?Arrival date & time: 06/25/21  1245 ? ?  ? ?History ? ?Chief Complaint  ?Patient presents with  ? Emesis  ? Shortness of Breath  ? ? ?Ian Boyd is a 71 y.o. male. ? ?Patient complains of vomiting and diarrhea for a week and some lower abdominal pain.  Also he states that he cannot lift his right arm ever since he was seen at the hospital 4 days ago.  He was given a shot at that time ? ?The history is provided by the patient and medical records.  ?Emesis ?Severity:  Moderate ?Timing:  Intermittent ?Quality:  Bilious material ?Able to tolerate:  Liquids ?Progression:  Worsening ?Chronicity:  New ?Recent urination:  Normal ?Relieved by:  Nothing ?Ineffective treatments:  None tried ?Associated symptoms: diarrhea   ?Associated symptoms: no abdominal pain, no cough and no headaches   ?Shortness of Breath ?Associated symptoms: vomiting   ?Associated symptoms: no abdominal pain, no chest pain, no cough, no headaches and no rash   ? ?  ? ?Home Medications ?Prior to Admission medications   ?Medication Sig Start Date End Date Taking? Authorizing Provider  ?Multiple Vitamins-Minerals (ADULT ONE DAILY GUMMIES) CHEW Chew 2 tablets by mouth daily.    [provider]  ?pantoprazole (PROTONIX) 40 MG tablet TAKE 1 TABLET (40 MG TOTAL) BY MOUTH 2 (TWO) TIMES DAILY BEFORE A MEAL. 08/04/18   Rehman, Mechele Dawley, MD  ?Phenyleph-Doxylamine-DM-APAP (NYQUIL SEVERE COLD/FLU PO) Take 1 Dose by mouth at bedtime as needed (congestion).    [provider]  ?polyethylene glycol-electrolytes (TRILYTE) 420 g solution Take 4,000 mLs by mouth as directed. 08/29/20   Harvel Quale, MD  ?   ? ?Allergies    ?Patient has no known allergies.   ? ?Review of Systems   ?Review of Systems  ?Constitutional:  Negative for appetite change and fatigue.  ?HENT:  Negative for congestion, ear discharge and sinus pressure.   ?Eyes:  Negative for discharge.   ?Respiratory:  Positive for shortness of breath. Negative for cough.   ?Cardiovascular:  Negative for chest pain.  ?Gastrointestinal:  Positive for diarrhea and vomiting. Negative for abdominal pain.  ?Genitourinary:  Negative for frequency and hematuria.  ?Musculoskeletal:  Negative for back pain.  ?Skin:  Negative for rash.  ?Neurological:  Negative for seizures and headaches.  ?Psychiatric/Behavioral:  Negative for hallucinations.   ? ?Physical Exam ?Updated Vital Signs ?BP 114/83   Pulse (!) 108   Temp 97.9 ?F (36.6 ?C) (Oral)   Resp 17   Ht 6' (1.829 m)   Wt 56.7 kg   SpO2 95%   BMI 16.95 kg/m?  ?Physical Exam ?Vitals and nursing note reviewed.  ?Constitutional:   ?   Appearance: He is well-developed.  ?HENT:  ?   Head: Normocephalic.  ?   Mouth/Throat:  ?   Mouth: Mucous membranes are moist.  ?Eyes:  ?   General: No scleral icterus. ?   Conjunctiva/sclera: Conjunctivae normal.  ?Neck:  ?   Thyroid: No thyromegaly.  ?Cardiovascular:  ?   Rate and Rhythm: Normal rate and regular rhythm.  ?   Heart sounds: No murmur heard. ?  No friction rub. No gallop.  ?Pulmonary:  ?   Breath sounds: No stridor. No wheezing or rales.  ?Chest:  ?   Chest wall: No tenderness.  ?Abdominal:  ?   General: There is no distension.  ?   Tenderness: There is no  abdominal tenderness. There is no rebound.  ?Musculoskeletal:  ?   Cervical back: Neck supple.  ?   Comments: Weakness in right arm.  He cannot lift the arm off bed but he can move his wrist up and down.  Patient has normal sensation  ?Lymphadenopathy:  ?   Cervical: No cervical adenopathy.  ?Skin: ?   Findings: No erythema or rash.  ?Neurological:  ?   Mental Status: He is alert and oriented to person, place, and time.  ?   Motor: No abnormal muscle tone.  ?   Coordination: Coordination normal.  ?Psychiatric:     ?   Behavior: Behavior normal.  ? ? ?ED Results / Procedures / Treatments   ?Labs ?(all labs ordered are listed, but only abnormal results are displayed) ?Labs  Reviewed  ?CBC WITH DIFFERENTIAL/PLATELET - Abnormal; Notable for the following components:  ?    Result Value  ? WBC 55.0 (*)   ? RBC 4.19 (*)   ? Hemoglobin 11.7 (*)   ? HCT 34.1 (*)   ? Platelets 88 (*)   ? Neutro Abs 52.8 (*)   ? Monocytes Absolute 1.1 (*)   ? All other components within normal limits  ?COMPREHENSIVE METABOLIC PANEL - Abnormal; Notable for the following components:  ? CO2 20 (*)   ? Glucose, Bld 106 (*)   ? BUN 61 (*)   ? Creatinine, Ser 2.90 (*)   ? Calcium 8.2 (*)   ? Albumin 2.2 (*)   ? AST 47 (*)   ? Alkaline Phosphatase 192 (*)   ? Total Bilirubin 5.1 (*)   ? GFR, Estimated 22 (*)   ? All other components within normal limits  ?TROPONIN I (HIGH SENSITIVITY) - Abnormal; Notable for the following components:  ? Troponin I (High Sensitivity) 66 (*)   ? All other components within normal limits  ?CULTURE, BLOOD (SINGLE)  ?LIPASE, BLOOD  ?LACTIC ACID, PLASMA  ?TROPONIN I (HIGH SENSITIVITY)  ? ? ?EKG ?None ? ?Radiology ?DG Shoulder Right ? ?Result Date: 06/25/2021 ?CLINICAL DATA:  Inability to move arm.  No trauma history submitted. EXAM: RIGHT SHOULDER - 2+ VIEW COMPARISON:  CT chest 05/29/2021 [facility] rocking ham FINDINGS: High riding humeral head. Mild degenerative changes of the undersurface of the acromion. No acute fracture or dislocation. Visualized portion of the right hemithorax is normal. IMPRESSION: High riding humeral head, consistent with chronic rotator cuff injury. No acute findings. Electronically Signed   By: Abigail Miyamoto M.D.   On: 06/25/2021 13:32  ? ?DG Chest Port 1 View ? ?Result Date: 06/25/2021 ?CLINICAL DATA:  Shortness of breath EXAM: PORTABLE CHEST 1 VIEW COMPARISON:  No prior chest radiograph FINDINGS: Cardiac and mediastinal contours are within normal limits. Aortic tortuosity. No focal pulmonary opacity. No pleural effusion or pneumothorax. No acute osseous abnormality. IMPRESSION: No acute cardiopulmonary process. Electronically Signed   By: Merilyn Baba M.D.   On:  06/25/2021 13:32   ? ?Procedures ?Procedures  ? ? ?Medications Ordered in ED ?Medications  ?albuterol (PROVENTIL) (2.5 MG/3ML) 0.083% nebulizer solution 3 mL (has no administration in time range)  ?metroNIDAZOLE (FLAGYL) IVPB 500 mg (has no administration in time range)  ?sodium chloride 0.9 % bolus 1,000 mL (has no administration in time range)  ?ceFEPIme (MAXIPIME) 2 g in sodium chloride 0.9 % 100 mL IVPB (has no administration in time range)  ?sodium chloride 0.9 % bolus 1,000 mL (1,000 mLs Intravenous New Bag/Given 06/25/21 1410)  ?ceFEPIme (MAXIPIME) 2 g  in sodium chloride 0.9 % 100 mL IVPB (2 g Intravenous New Bag/Given 06/25/21 1416)  ? ? ?ED Course/ Medical Decision Making/ A&P ? CRITICAL CARE ?Performed by: Milton Ferguson ?Total critical care time: 50 minutes ?Critical care time was exclusive of separately billable procedures and treating other patients. ?Critical care was necessary to treat or prevent imminent or life-threatening deterioration. ?Critical care was time spent personally by me on the following activities: development of treatment plan with patient and/or surrogate as well as nursing, discussions with consultants, evaluation of patient's response to treatment, examination of patient, obtaining history from patient or surrogate, ordering and performing treatments and interventions, ordering and review of laboratory studies, ordering and review of radiographic studies, pulse oximetry and re-evaluation of patient's condition. ? ? ?Patient with white blood count of 55,000 and possible sepsis.  He is getting a CT scan of his abdomen and an MRI of his head and neck.  He will admit admitted after results of test ?                        ?Medical Decision Making ?Amount and/or Complexity of Data Reviewed ?Labs: ordered. ?Radiology: ordered. ? ?Risk ?Prescription drug management. ?Decision regarding hospitalization. ? ?This patient presents to the ED for concern of abdominal pain and diarrhea, this involves  an extensive number of treatment options, and is a complaint that carries with it a high risk of complications and morbidity.  The differential diagnosis includes diverticulitis appendicitis ? ? ?Co morbidities that comp

## 2021-06-25 NOTE — Assessment & Plan Note (Addendum)
Counseled on cessation 

## 2021-06-25 NOTE — Assessment & Plan Note (Addendum)
Hydronephrosis of the left kidney, with hydroureter, secondary to calculus obstruction at the left vesicoureteral junction.  Also with  urinary tract infection. Evaluated by urologist Dr. Alyson Ingles with recommendation for left nephrostomy tube placement which was performed on 4/6 by IR ?

## 2021-06-25 NOTE — ED Provider Notes (Signed)
Patient initially seen by Dr. Roderic Palau.  Please see his note.  Plan was to review his imaging studies that were still pending and to admit to the hospital. ? ?MRI of the cervical spine shows evidence of degenerative changes and foraminal stenosis but no cord compression or cord signal abnormality overall appears chronic in nature compared to prior CT scan.  MRI of the brain without acute stroke.  CT of the abdomen pelvis shows probable psoas abscess versus psoas hematoma with superimposed infection.  There is evidence of hydronephrosis and hydroureter secondary to calculus obstruction at the vesicoureteral junction.  There is also a large volume of gas noted within the bladder. ? ?Discussed with Dr Constance Haw.  No indication for surgery.  Would need IR intervention. ? ?Discussed with Dr Alyson Ingles.  Will likely need nephrostomy tube. IR management. ? ?Discussed case with Dr. Denton Brick.  She will evaluate the patient regarding admission. ? ?D/w Dr. Earleen Newport, IR at [facility].  Will evaluate pt once pt [facility] at [facility] ?  ?Dorie Rank, MD ?06/25/21 1617 ? ?

## 2021-06-25 NOTE — Consult Note (Signed)
Urology Consult  ?Referring physician: Dr. Tomi Bamberger ?Reason for referral: left ureteral calculus ? ?Chief Complaint: right abdominal pain ? ?History of Present Illness: Ian Boyd is a 71yo who presented to the ER with a 2 week history of right abdominal and urinary frequency, urgency and dysuria. He is currently follow by Dr. Abner Greenspan at [facility] and is currently on flomax 0.'4mg'$  daily for BPH with urinary frequency. He denies a prior history of nephrolithiasis. At the time his right abdominal pain began he developed worsening LUTS and dysuria. He denies gross hematuria. He denies a history of recurrent UTI.  He has been having nausea and vomiting for the past 3-4 days. He has been having fevers and chills for the past 2 days. He underwent CT which showed bladder calculi, gas in the bladder a left distal ureteral calculus, mild left hydronephrosis, and a right psoas abscess/hematoma. He denies left flank pain. No other associated symptoms. No exacerbating/alleviating events.  ? ?Past Medical History:  ?Diagnosis Date  ? Acid reflux   ? Blind right eye   ? Drug abuse ([place])   ? ?Past Surgical History:  ?Procedure Laterality Date  ? CATARACT EXTRACTION Left   ? ESOPHAGEAL DILATION N/A 05/12/2018  ? Procedure: ESOPHAGEAL DILATION;  Surgeon: Rogene Houston, MD;  [facility];  Service: Endoscopy;  Laterality: N/A;  ? ESOPHAGOGASTRODUODENOSCOPY N/A 05/12/2018  ? Procedure: ESOPHAGOGASTRODUODENOSCOPY (EGD);  Surgeon: Rogene Houston, MD;  Location[facility];  Service: Endoscopy;  Laterality: N/A;  2:45  ? ? ?Medications: I have reviewed the patient's current medications. ?Allergies: No Known Allergies ? ?History reviewed. No pertinent family history. ?Social History:  reports that he has been smoking cigarettes. He has been smoking an average of .5 packs per day. He has never used smokeless tobacco. He reports current drug use. Drugs: Cocaine and Marijuana. He reports that he does not drink  alcohol. ? ?Review of Systems  ?Constitutional:  Positive for chills and fever.  ?Gastrointestinal:  Positive for abdominal pain, diarrhea, nausea and vomiting.  ?Genitourinary:  Positive for dysuria, frequency and urgency.  ?All other systems reviewed and are negative. ? ?Physical Exam:  ?Vital signs in last 24 hours: ?Temp:  [97.9 ?F (36.6 ?C)] 97.9 ?F (36.6 ?C) (04/05 1254) ?Pulse Rate:  [107-120] 108 (04/05 1600) ?Resp:  [17-20] 17 (04/05 1600) ?BP: (114-126)/(66-83) 116/66 (04/05 1600) ?SpO2:  [95 %-99 %] 99 % (04/05 1600) ?Weight:  [56.7 kg] 56.7 kg (04/05 1255) ?Physical Exam ?HENT:  ?   Head: Normocephalic and atraumatic.  ?Eyes:  ?   Extraocular Movements: Extraocular movements intact.  ?   Pupils: Pupils are equal, round, and reactive to light.  ?Cardiovascular:  ?   Rate and Rhythm: Regular rhythm. Tachycardia present.  ?Pulmonary:  ?   Effort: Pulmonary effort is normal.  ?Abdominal:  ?   Palpations: Abdomen is soft.  ?   Tenderness: There is abdominal tenderness.  ?Musculoskeletal:     ?   General: Normal range of motion.  ?   Cervical back: Normal range of motion and neck supple.  ?Skin: ?   General: Skin is warm and dry.  ?Neurological:  ?   General: No focal deficit present.  ?   Mental Status: He is alert.  ?Psychiatric:     ?   Mood and Affect: Mood normal.     ?   Behavior: Behavior normal.  ? ? ?Laboratory Data:  ?Results for orders placed or performed during the hospital encounter  of 06/25/21 (from the past 72 hour(s))  ?CBC with Differential     Status: Abnormal  ? Collection Time: 06/25/21  1:17 PM  ?Result Value Ref Range  ? WBC 55.0 (HH) 4.0 - 10.5 K/uL  ?  Comment: CRITICAL VALUE NOTED.  VALUE IS CONSISTENT WITH PREVIOUSLY REPORTED AND CALLED VALUE. ?REPEATED TO VERIFY ?THIS CRITICAL RESULT HAS VERIFIED AND BEEN CALLED TO B CRABTREE BY SHELBY VAILLANCOURT ON 04 05 2023 AT 1359, AND HAS BEEN READ BACK.  ?  ? RBC 4.19 (L) 4.22 - 5.81 MIL/uL  ? Hemoglobin 11.7 (L) 13.0 - 17.0 g/dL  ? HCT 34.1  (L) 39.0 - 52.0 %  ? MCV 81.4 80.0 - 100.0 fL  ? MCH 27.9 26.0 - 34.0 pg  ? MCHC 34.3 30.0 - 36.0 g/dL  ? RDW 15.2 11.5 - 15.5 %  ? Platelets 88 (L) 150 - 400 K/uL  ?  Comment: SPECIMEN CHECKED FOR CLOTS ?Immature Platelet Fraction may be ?clinically indicated, consider ?ordering this additional test ?[id number] ?PLATELET COUNT CONFIRMED BY SMEAR ?  ? nRBC 0.0 0.0 - 0.2 %  ? Neutrophils Relative % 85 %  ? Neutro Abs 52.8 (H) 1.7 - 7.7 K/uL  ? Band Neutrophils 11 %  ? Lymphocytes Relative 2 %  ? Lymphs Abs 1.1 0.7 - 4.0 K/uL  ? Monocytes Relative 2 %  ? Monocytes Absolute 1.1 (H) 0.1 - 1.0 K/uL  ? Eosinophils Relative 0 %  ? Eosinophils Absolute 0.0 0.0 - 0.5 K/uL  ? Basophils Relative 0 %  ? Basophils Absolute 0.0 0.0 - 0.1 K/uL  ? WBC Morphology TOXIC GRANULATION   ?  Comment: VACUOLATED NEUTROPHILS  ? RBC Morphology MORPHOLOGY UNREMARKABLE   ?  Comment: Performed at [facility], [address] [place], [place] [address]  ?Comprehensive metabolic panel     Status: Abnormal  ? Collection Time: 06/25/21  1:17 PM  ?Result Value Ref Range  ? Sodium 136 135 - 145 mmol/L  ? Potassium 3.9 3.5 - 5.1 mmol/L  ? Chloride 102 98 - 111 mmol/L  ? CO2 20 (L) 22 - 32 mmol/L  ? Glucose, Bld 106 (H) 70 - 99 mg/dL  ?  Comment: Glucose reference range applies only to samples taken after fasting for at least 8 hours.  ? BUN 61 (H) 8 - 23 mg/dL  ? Creatinine, Ser 2.90 (H) 0.61 - 1.24 mg/dL  ? Calcium 8.2 (L) 8.9 - 10.3 mg/dL  ? Total Protein 6.7 6.5 - 8.1 g/dL  ? Albumin 2.2 (L) 3.5 - 5.0 g/dL  ? AST 47 (H) 15 - 41 U/L  ? ALT 24 0 - 44 U/L  ? Alkaline Phosphatase 192 (H) 38 - 126 U/L  ? Total Bilirubin 5.1 (H) 0.3 - 1.2 mg/dL  ? GFR, Estimated 22 (L) >60 mL/min  ?  Comment: (NOTE) ?Calculated using the CKD-EPI Creatinine Equation (2021) ?  ? Anion gap 14 5 - 15  ?  Comment: Performed at [facility], [address], [place], [place] [address]  ?Lipase, blood     Status: None  ? Collection Time: 06/25/21  1:17 PM  ?Result Value Ref Range   ? Lipase 19 11 - 51 U/L  ?  Comment: Performed at [facility], [address], [place], [place] [address]  ?Troponin I (High Sensitivity)     Status: Abnormal  ? Collection Time: 06/25/21  1:17 PM  ?Result Value Ref Range  ? Troponin I (High Sensitivity) 66 (H) <18  ng/L  ?  Comment: (NOTE) ?Elevated high sensitivity troponin I (hsTnI) values and significant  ?changes across serial measurements may suggest ACS but many other  ?chronic and acute conditions are known to elevate hsTnI results.  ?Refer to the "Links" section for chest pain algorithms and additional  ?guidance. ?Performed at [facility], [address], [place], [place] [address] ?  ?Culture, blood (single)     Status: None (Preliminary result)  ? Collection Time: 06/25/21  2:15 PM  ? Specimen: Left Antecubital; Blood  ?Result Value Ref Range  ? Specimen Description LEFT ANTECUBITAL   ? Special Requests    ?  BOTTLES DRAWN AEROBIC AND ANAEROBIC Blood Culture adequate volume ?Performed at [facility], [address] [place], [place] [address] ?  ? Culture PENDING   ? Report Status PENDING   ?Lactic acid, plasma     Status: None  ? Collection Time: 06/25/21  2:27 PM  ?Result Value Ref Range  ? Lactic Acid, Venous 1.8 0.5 - 1.9 mmol/L  ?  Comment: Performed at [facility], [address] [place], [place] [address]  ?Troponin I (High Sensitivity)     Status: Abnormal  ? Collection Time: 06/25/21  2:31 PM  ?Result Value Ref Range  ? Troponin I (High Sensitivity) 42 (H) <18 ng/L  ?  Comment: DELTA CHECK NOTED ?RESULT CALLED TO, READ BACK BY AND VERIFIED WITH: ?WHITE,M ON 06/25/21 AT 1535 BY LOY,C ?(NOTE) ?Elevated high sensitivity troponin I (hsTnI) values and significant  ?changes across serial measurements may suggest ACS but many other  ?chronic and acute conditions are known to elevate hsTnI results.  ?Refer to the Links section for chest pain algorithms and additional  ?guidance. ?Performed at [facility], [address] [place], [place] [address] ?   ? ?Recent Results (from the past 240 hour(s))  ?Culture, blood (single)     Status: None (Preliminary result)  ? Collection Time: 06/25/21  2:15 PM  ? Specimen: Left Antecubital; Blood  ?Result Value Ref Range Status

## 2021-06-25 NOTE — ED Notes (Signed)
[facility] paged for Transport ?

## 2021-06-25 NOTE — H&P (Addendum)
?History and Physical  ? ? ?Ian Boyd QVZ:[id number] DOB: 1950-11-30 DOA: 06/25/2021 ? ?PCP: Curlene Labrum, MD ? ?Patient coming from: Home ? ?I have personally briefly reviewed patient's old medical records in [place] ? ?Chief Complaint: vomiting, abdominal pain ? ?HPI: Ian Boyd is a 71 y.o. male with medical history significant for Hep C, blind right eye, dysphagia.  Patient presented to the ED with complaint of nausea vomiting diarrhea of about 2 weeks duration.  He also reports lower abdominal pain, both to the left and right side of his lower abdomen.  He presented to [facility], he was given a shot of an injection, and subsequently he declined even further, he could not walk, and then he also could not move his right arm.  Patient has had problems with his right shoulder for at least 3 years, but this significantly worsened over the past few days. ?He reports pain with urination for the past several days.  Also fevers and chills at home. ?Patient lives alone, but over the past few days, family have had to come in to help him.  He reports use of crack cocaine-through his mouth.  He denies any IV drug use.  Denies alcohol abuse. ? ?ED Course: Tmax 100.5.  Heart rate 107-124.  Respiratory rate 17-34.  Leukocytosis of 55.  Troponin 66 > 42.  Lactic acid 1.8. ?Chest x-ray clear.  UA with positive leukocytes.  MRI brain shows chronic micro vascular ischemic changes.  Right shoulder x-ray shows suggest chronic rotator cuff injury. ?CT abdomen and pelvis Wo contrast shows right psoas abscess versus hematoma plus superimposed infection.  Also left hydronephrosis and hydroureter secondary to calculus obstruction at the left vesicoureteral junction.  Large volume of gas within the bladder. ?Broad-spectrum antibiotics IV Vanco, cefepime and metronidazole started. ?Urine cultures obtained. ?EDP talked to general surgeon Dr. Constance Haw, recommended admission to [facility], and IR to see patient.   Urology was also consulted in the ED. ?EDP also reached out to IR, they will see patient on arrival to [facility]. ? ?Review of Systems: As per HPI all other systems reviewed and negative. ? ?Past Medical History:  ?Diagnosis Date  ? Acid reflux   ? Blind right eye   ? Drug abuse ([place])   ? ? ?Past Surgical History:  ?Procedure Laterality Date  ? CATARACT EXTRACTION Left   ? ESOPHAGEAL DILATION N/A 05/12/2018  ? Procedure: ESOPHAGEAL DILATION;  Surgeon: Rogene Houston, MD;  [facility];  Service: Endoscopy;  Laterality: N/A;  ? ESOPHAGOGASTRODUODENOSCOPY N/A 05/12/2018  ? Procedure: ESOPHAGOGASTRODUODENOSCOPY (EGD);  Surgeon: Rogene Houston, MD;  [facility];  Service: Endoscopy;  Laterality: N/A;  2:45  ? ? ? reports that he has been smoking cigarettes. He has been smoking an average of .5 packs per day. He has never used smokeless tobacco. He reports current drug use. Drugs: Cocaine and Marijuana. He reports that he does not drink alcohol. ? ?No Known Allergies ? ?Family history of hypertension. ? ?Prior to Admission medications   ?Medication Sig Start Date End Date Taking? Authorizing Provider  ?Albuterol Sulfate, sensor, (PROAIR DIGIHALER) 108 (90 Base) MCG/ACT AEPB Inhale 1 puff into the lungs daily as needed (shortness of breath).   Yes [provider]  ?brimonidine (ALPHAGAN) 0.2 % ophthalmic solution SMARTSIG:In Eye(s) 06/22/21  Yes [provider]  ?dorzolamide-timolol (COSOPT) 22.3-6.8 MG/ML ophthalmic solution Place 1 drop into the left eye 2 (two) times daily. 05/05/21  Yes  [provider]  ?ibuprofen (ADVIL) 600 MG tablet Take 600 mg by mouth every 6 (six) hours as needed. 06/19/21  Yes [provider]  ?Multiple Vitamin (MULTIVITAMIN) tablet Take 1 tablet by mouth daily.   Yes [provider]  ?Phenyleph-Doxylamine-DM-APAP (NYQUIL SEVERE COLD/FLU PO) Take 1 Dose by mouth at bedtime as needed (congestion).   Yes [provider]   ?RHOPRESSA 0.02 % SOLN Place 1 drop into the left eye at bedtime. 05/05/21  Yes [provider]  ?tamsulosin (FLOMAX) 0.4 MG CAPS capsule Take 0.4 mg by mouth at bedtime. 04/17/21  Yes [provider]  ?tiotropium (SPIRIVA) 18 MCG inhalation capsule Place 18 mcg into inhaler and inhale daily.   Yes [provider]  ?VYZULTA 0.024 % SOLN Place 1 drop into the left eye at bedtime. 05/05/21  Yes [provider]  ?pantoprazole (PROTONIX) 40 MG tablet TAKE 1 TABLET (40 MG TOTAL) BY MOUTH 2 (TWO) TIMES DAILY BEFORE A MEAL. ?Patient not taking: Reported on 06/25/2021 08/04/18   Rogene Houston, MD  ?polyethylene glycol-electrolytes (TRILYTE) 420 g solution Take 4,000 mLs by mouth as directed. ?Patient not taking: Reported on 06/25/2021 08/29/20   Harvel Quale, MD  ? ? ?Physical Exam: ?Vitals:  ? 06/25/21 1700 06/25/21 1800 06/25/21 1804 06/25/21 1815  ?BP: 120/62 (!) 105/58    ?Pulse:  (!) 124  (!) 124  ?Resp: (!) 29 (!) 32  (!) 34  ?Temp:   (!) 100.5 ?F (38.1 ?C)   ?TempSrc:   Axillary   ?SpO2:  93%  94%  ?Weight:      ?Height:      ? ? ?Constitutional: Acutely and chronically ill-appearing, weak ?Vitals:  ? 06/25/21 1700 06/25/21 1800 06/25/21 1804 06/25/21 1815  ?BP: 120/62 (!) 105/58    ?Pulse:  (!) 124  (!) 124  ?Resp: (!) 29 (!) 32  (!) 34  ?Temp:   (!) 100.5 ?F (38.1 ?C)   ?TempSrc:   Axillary   ?SpO2:  93%  94%  ?Weight:      ?Height:      ? ?Eyes: PERRL, lids and conjunctivae normal ?ENMT: Mucous membranes are very dry, with whitish residue on lips and tongue ?Neck: normal, supple, no masses, no thyromegaly ?Respiratory: Tachypneic, clear to auscultation bilaterally, no wheezing, no crackles. Normal respiratory effort. No accessory muscle use.  ?Cardiovascular: Tachycardic, regular rate and rhythm, no murmurs / rubs / gallops. No extremity edema.  Lower extremities warm. ?Abdomen: Mild guarding, minimal tenderness lower abdomen, no masses palpated. No hepatosplenomegaly.  Bowel sounds positive.  ?Musculoskeletal: no clubbing / cyanosis. No joint deformity upper and lower extremities.  Unable to move right shoulder.  ?Skin: no rashes, lesions, ulcers. No induration ?Neurologic: 4/5 strength in all extremities, okay grip strength bilaterally, but is unable to lift right upper extremity against gravity.  Speech fluent, no evidence of aphasia.  No facial asymmetry. ?Psychiatric: Normal judgment and insight. Alert and oriented x 3. Normal mood.  ? ?Labs on Admission: I have personally reviewed following labs and imaging studies ? ?CBC: ?Recent Labs  ?Lab 06/25/21 ?1317  ?WBC 55.0*  ?NEUTROABS 52.8*  ?HGB 11.7*  ?HCT 34.1*  ?MCV 81.4  ?PLT 88*  ? ?Basic Metabolic Panel: ?Recent Labs  ?Lab 06/25/21 ?1317  ?NA 136  ?K 3.9  ?CL 102  ?CO2 20*  ?GLUCOSE 106*  ?BUN 61*  ?CREATININE 2.90*  ?CALCIUM 8.2*  ? ?GFR: ?Estimated Creatinine Clearance: 18.2 mL/min (A) (by C-G formula based on  SCr of 2.9 mg/dL (H)). ?Liver Function Tests: ?Recent Labs  ?Lab 06/25/21 ?1317  ?AST 47*  ?ALT 24  ?ALKPHOS 192*  ?BILITOT 5.1*  ?PROT 6.7  ?ALBUMIN 2.2*  ? ?Recent Labs  ?Lab 06/25/21 ?1317  ?LIPASE 19  ? ?Urine analysis: ?   ?Component Value Date/Time  ? COLORURINE YELLOW 06/25/2021 1635  ? APPEARANCEUR HAZY (A) 06/25/2021 1635  ? LABSPEC 1.012 06/25/2021 1635  ? PHURINE 5.0 06/25/2021 1635  ? GLUCOSEU NEGATIVE 06/25/2021 1635  ? HGBUR MODERATE (A) 06/25/2021 1635  ? [place] NEGATIVE 06/25/2021 1635  ? [place] NEGATIVE 06/25/2021 1635  ? PROTEINUR 30 (A) 06/25/2021 1635  ? NITRITE NEGATIVE 06/25/2021 1635  ? LEUKOCYTESUR MODERATE (A) 06/25/2021 1635  ? ? ?Radiological Exams on Admission: ?CT ABDOMEN PELVIS WO CONTRAST ? ?Result Date: 06/25/2021 ?CLINICAL DATA:  Abdominal pain.  Acute.  Nonlocalized. EXAM: CT ABDOMEN AND PELVIS WITHOUT CONTRAST TECHNIQUE: Multidetector CT imaging of the abdomen and pelvis was performed following the standard protocol without IV contrast. RADIATION DOSE REDUCTION: This exam was  performed according to the departmental dose-optimization program which includes automated exposure control, adjustment of the mA and/or kV according to patient size and/or use of iterative reconstruction

## 2021-06-25 NOTE — Progress Notes (Signed)
Pharmacy Antibiotic Note ? ?Ian Boyd is a 71 y.o. male admitted on 06/25/2021 with sepsis.  Pharmacy has been consulted for Cefepime dosing. ? ?Plan: ?Cefepime 2gm IV q24hrs (renally adjusted) ? ?Height: 6' (182.9 cm) ?Weight: 56.7 kg (125 lb) ?IBW/kg (Calculated) : 77.6 ? ?Temp (24hrs), Avg:97.9 ?F (36.6 ?C), Min:97.9 ?F (36.6 ?C), Max:97.9 ?F (36.6 ?C) ? ?Recent Labs  ?Lab 06/25/21 ?1317  ?WBC 55.0*  ?CREATININE 2.90*  ?  ?Estimated Creatinine Clearance: 18.2 mL/min (A) (by C-G formula based on SCr of 2.9 mg/dL (H)).   ? ?No Known Allergies ? ?Antimicrobials this admission: ?Cefepime 4/5 >>  ?Flagyl 4/5 >>  ? ?Dose adjustments this admission: ? ? ?Microbiology results: ? BCx: pending ? UCx:   ? Sputum:   ? MRSA PCR:  ? ?Thank you for allowing pharmacy to be a part of this patient?s care. ? ?Hart Robinsons A ?06/25/2021 2:57 PM ? ?

## 2021-06-25 NOTE — Assessment & Plan Note (Addendum)
Creatinine of 2.9 on admission.  Improved after nephrostomy tube placement.  ?

## 2021-06-25 NOTE — Assessment & Plan Note (Addendum)
Symptomatic with dysuria, UA with positive leukocytes, with severe sepsis. Complicated by obstructive ureteral calculus. Urine culture with multiple species. ?-Continue abx for bacteremia and abscess ?

## 2021-06-26 ENCOUNTER — Inpatient Hospital Stay (HOSPITAL_COMMUNITY): Payer: Medicare Other

## 2021-06-26 ENCOUNTER — Encounter (HOSPITAL_COMMUNITY): Payer: Self-pay | Admitting: Internal Medicine

## 2021-06-26 DIAGNOSIS — B965 Pseudomonas (aeruginosa) (mallei) (pseudomallei) as the cause of diseases classified elsewhere: Secondary | ICD-10-CM | POA: Insufficient documentation

## 2021-06-26 DIAGNOSIS — K6812 Psoas muscle abscess: Secondary | ICD-10-CM | POA: Diagnosis not present

## 2021-06-26 DIAGNOSIS — B9620 Unspecified Escherichia coli [E. coli] as the cause of diseases classified elsewhere: Secondary | ICD-10-CM | POA: Insufficient documentation

## 2021-06-26 DIAGNOSIS — R7881 Bacteremia: Secondary | ICD-10-CM | POA: Insufficient documentation

## 2021-06-26 HISTORY — PX: IR NEPHROSTOMY PLACEMENT LEFT: IMG6063

## 2021-06-26 LAB — PROTIME-INR
INR: 1.3 — ABNORMAL HIGH (ref 0.8–1.2)
Prothrombin Time: 15.6 seconds — ABNORMAL HIGH (ref 11.4–15.2)

## 2021-06-26 LAB — TYPE AND SCREEN
ABO/RH(D): B POS
Antibody Screen: NEGATIVE

## 2021-06-26 LAB — BLOOD CULTURE ID PANEL (REFLEXED) - BCID2

## 2021-06-26 LAB — CBC
HCT: 27 % — ABNORMAL LOW (ref 39.0–52.0)
Hemoglobin: 9.2 g/dL — ABNORMAL LOW (ref 13.0–17.0)
MCH: 27.7 pg (ref 26.0–34.0)
MCHC: 34.1 g/dL (ref 30.0–36.0)
MCV: 81.3 fL (ref 80.0–100.0)
Platelets: 43 10*3/uL — ABNORMAL LOW (ref 150–400)
RBC: 3.32 MIL/uL — ABNORMAL LOW (ref 4.22–5.81)
RDW: 15.1 % (ref 11.5–15.5)
WBC: 44 10*3/uL — ABNORMAL HIGH (ref 4.0–10.5)
nRBC: 0 % (ref 0.0–0.2)

## 2021-06-26 LAB — BASIC METABOLIC PANEL
Anion gap: 13 (ref 5–15)
BUN: 61 mg/dL — ABNORMAL HIGH (ref 8–23)
CO2: 16 mmol/L — ABNORMAL LOW (ref 22–32)
Calcium: 7.2 mg/dL — ABNORMAL LOW (ref 8.9–10.3)
Chloride: 110 mmol/L (ref 98–111)
Creatinine, Ser: 2.48 mg/dL — ABNORMAL HIGH (ref 0.61–1.24)
GFR, Estimated: 27 mL/min — ABNORMAL LOW (ref >60)
Glucose, Bld: 88 mg/dL (ref 70–99)
Potassium: 3.6 mmol/L (ref 3.5–5.1)
Sodium: 139 mmol/L (ref 135–145)

## 2021-06-26 LAB — ABO/RH: ABO/RH(D): B POS

## 2021-06-26 MED ORDER — LATANOPROSTENE BUNOD 0.024 % OP SOLN: 1.0000 [drp] | Freq: Every day | OPHTHALMIC | Status: DC

## 2021-06-26 MED ORDER — FENTANYL CITRATE (PF) 100 MCG/2ML IJ SOLN
INTRAMUSCULAR | Status: AC
  Filled 2021-06-26: qty 6

## 2021-06-26 MED ORDER — MIDAZOLAM HCL 2 MG/2ML IJ SOLN
INTRAMUSCULAR | Status: AC | PRN
  Administered 2021-06-26 (×2): .5 mg via INTRAVENOUS
  Administered 2021-06-26: 1 mg via INTRAVENOUS

## 2021-06-26 MED ORDER — SODIUM CHLORIDE 0.9 % IV SOLN
INTRAVENOUS | Status: AC
  Administered 2021-06-26: 2 g via INTRAVENOUS
  Filled 2021-06-26: qty 20

## 2021-06-26 MED ORDER — LATANOPROST 0.005 % OP SOLN
1.0000 [drp] | Freq: Every day | OPHTHALMIC | Status: DC
  Administered 2021-06-26 – 2021-07-03 (×8): 1 [drp] via OPHTHALMIC
  Filled 2021-06-26: qty 2.5

## 2021-06-26 MED ORDER — BRIMONIDINE TARTRATE 0.2 % OP SOLN
1.0000 [drp] | Freq: Two times a day (BID) | OPHTHALMIC | Status: DC
  Administered 2021-06-26 – 2021-07-04 (×17): 1 [drp] via OPHTHALMIC
  Filled 2021-06-26 (×2): qty 5

## 2021-06-26 MED ORDER — MIDAZOLAM HCL 2 MG/2ML IJ SOLN
INTRAMUSCULAR | Status: AC
  Filled 2021-06-26: qty 6

## 2021-06-26 MED ORDER — LIDOCAINE HCL 1 % IJ SOLN
INTRAMUSCULAR | Status: AC
  Filled 2021-06-26: qty 10

## 2021-06-26 MED ORDER — SODIUM CHLORIDE 0.9% IV SOLUTION
Freq: Once | INTRAVENOUS | Status: DC
Start: 2021-06-26 — End: 2021-06-30

## 2021-06-26 MED ORDER — CEFTRIAXONE SODIUM 2 G IJ SOLR
2.0000 g | INTRAMUSCULAR | Status: DC
  Administered 2021-06-27 – 2021-06-28 (×2): 2 g via INTRAVENOUS
  Filled 2021-06-26 (×4): qty 20

## 2021-06-26 MED ORDER — LIDOCAINE-EPINEPHRINE 1 %-1:100000 IJ SOLN
INTRAMUSCULAR | Status: AC
Start: 2021-06-26 — End: 2021-06-27
  Filled 2021-06-26: qty 1

## 2021-06-26 MED ORDER — DORZOLAMIDE HCL-TIMOLOL MAL 2-0.5 % OP SOLN
1.0000 [drp] | Freq: Two times a day (BID) | OPHTHALMIC | Status: DC
  Administered 2021-06-26 – 2021-07-04 (×17): 1 [drp] via OPHTHALMIC
  Filled 2021-06-26 (×2): qty 10

## 2021-06-26 MED ORDER — LIDOCAINE HCL 1 % IJ SOLN
INTRAMUSCULAR | Status: AC
  Filled 2021-06-26: qty 20

## 2021-06-26 MED ORDER — CHLORHEXIDINE GLUCONATE CLOTH 2 % EX PADS
6.0000 | MEDICATED_PAD | Freq: Every day | CUTANEOUS | Status: DC
  Administered 2021-06-26 – 2021-07-03 (×8): 6 via TOPICAL

## 2021-06-26 MED ORDER — SODIUM CHLORIDE 0.9% FLUSH
5.0000 mL | Freq: Three times a day (TID) | INTRAVENOUS | Status: DC
Start: 2021-06-26 — End: 2021-07-04
  Administered 2021-06-26 – 2021-07-04 (×22): 5 mL

## 2021-06-26 MED ORDER — FENTANYL CITRATE (PF) 100 MCG/2ML IJ SOLN
INTRAMUSCULAR | Status: AC | PRN
  Administered 2021-06-26: 25 ug via INTRAVENOUS
  Administered 2021-06-26: 50 ug via INTRAVENOUS
  Administered 2021-06-26: 25 ug via INTRAVENOUS

## 2021-06-26 MED ORDER — MIDAZOLAM HCL 2 MG/2ML IJ SOLN
INTRAMUSCULAR | Status: AC | PRN
Start: 2021-06-26 — End: 2021-06-26
  Administered 2021-06-26: .5 mg via INTRAVENOUS

## 2021-06-26 MED ORDER — NETARSUDIL DIMESYLATE 0.02 % OP SOLN
1.0000 [drp] | Freq: Every day | OPHTHALMIC | Status: DC
  Filled 2021-06-26 (×5): qty 1

## 2021-06-26 NOTE — Progress Notes (Signed)
?  Transition of Care (TOC) Screening Note ? ? ?Patient Details  ?Name: Ian Boyd ?Date of Birth: 03/06/1951 ? ? ?Transition of Care (TOC) CM/SW Contact:    ?Dahlia Client, Romeo Rabon, RN ?Phone Number: ?06/26/2021, 3:19 PM ? ? ? ?Transition of Care Department [facility]) has reviewed patient and no TOC needs have been identified at this time, note that IR placed drains today. We will continue to monitor patient advancement through interdisciplinary progression rounds. If new patient transition needs arise, please place a TOC consult. ?  ?

## 2021-06-26 NOTE — Assessment & Plan Note (Addendum)
Presumed urinary source, although urine culture unhelpful. Leukocytosis improving. ?-Continue abx ?-ecoli- pan sensitive ?

## 2021-06-26 NOTE — Plan of Care (Signed)

## 2021-06-26 NOTE — Progress Notes (Addendum)
Report received from [facility] from [facility]. Awaiting patient's arrival.  ?

## 2021-06-26 NOTE — Progress Notes (Signed)
Interventional radiology text paged and notified of patient arrival per order.  ?

## 2021-06-26 NOTE — Progress Notes (Signed)
?PROGRESS NOTE ? ? ? ?SHERIFF RODENBERG  NWG:[id number] DOB: [birth year] DOA: 06/25/2021 ?PCP: Curlene Labrum, MD  ? ? ?Brief Narrative:  ?Ian Boyd is a 71 y.o. male with medical history significant for Hep C, blind right eye, dysphagia.  Patient presented to the ED with complaint of nausea vomiting diarrhea of about 2 weeks duration.  He also reports lower abdominal pain, both to the left and right side of his lower abdomen.  He presented to [facility], he was given a shot of an injection, and subsequently he declined even further, he could not walk, and then he also could not move his right arm.  Patient has had problems with his right shoulder for at least 3 years, but this significantly worsened over the past few days.  Found to have ecoli bacteremia with a psoas abscess.    ? ? ?Assessment and Plan: ?* Psoas abscess, right ([place]) ?Presenting with severe sepsis.  CT showing right psoas abscess versus psoas hematoma with superimposed infection. ?-IR to place drain ?-Continue IV abx ? ?AKI (acute kidney injury) ([place]) ?Creatinine 2.9.   ?-No baseline to compare. ?-daily labs ? ?Severe sepsis ([place]) ?Severe sepsis secondary to psoas abscess, urinary tract infection.  -Follow-up blood and urine cultures- appears to be e coli ? ?Hydronephrosis of left kidney ?Hydronephrosis of the left kidney, with hydroureter, secondary to calculus obstruction at the left vesicoureteral junction.  Also with  urinary tract infection. ?-Evaluated by urologist Dr. Alyson Ingles, recommendation for left nephrostomy tube placement ?-IR to place 4/6. ?-IV antibiotics. ? ?Rotator cuff tear arthropathy ?Unable to move right upper arm .  X-ray suggesting chronic rotator cuff injury.  Reports right shoulder problems for about 3 years now.   ?- MRI brain negative for acute abnormality.  CT cervical spine without cord compression or cord signal abnormality, shows multilevel degenerative disease with spinal and foraminal stenosis. ?-will  need to have cleared infection prior to orthopedic intervention of shoulder ? ?Urinary tract infection ?Symptomatic with dysuria, UA with positive leukocytes, with severe sepsis.  In the setting of nephrolithiasis. ?-IV abx ?-Follow-up urine cultures ? ?Cocaine use ?-encourage cessation ? ? ?Bacteremia ?-ecoli ?-continue IV abx ? ? ? ? ? ? ?DVT prophylaxis: SCDs Start: 06/25/21 2344 ? ?  Code Status: Full Code ? ? ?Disposition Plan:  ?Level of care: Progressive ?Status is: Inpatient ?Remains inpatient appropriate because: needs drains placed ?  ? ?Consultants:  ?IR ?Urology ?GS (phone) ? ? ?Subjective: ?In bed, c/o pain and SOB ? ?Objective: ?Vitals:  ? 06/26/21 0400 06/26/21 0751 06/26/21 1144 06/26/21 1203  ?BP:  117/69 130/75   ?Pulse: 100 95 87 89  ?Resp:  '17 16 16  '$ ?Temp:  98.3 ?F (36.8 ?C) 97.9 ?F (36.6 ?C) 98 ?F (36.7 ?C)  ?TempSrc:  Oral Oral Oral  ?SpO2: 97% 97% 98% 98%  ?Weight:      ?Height:      ? ? ?Intake/Output Summary (Last 24 hours) at 06/26/2021 1241 ?Last data filed at 06/26/2021 0600 ?Gross per 24 hour  ?Intake 4235.33 ml  ?Output 1500 ml  ?Net 2735.33 ml  ? ?Filed Weights  ? 06/25/21 1255 06/25/21 2322  ?Weight: 56.7 kg 60.1 kg  ? ? ?Examination: ? ? ?General: Appearance:    Thin male who appears ill  ?   ?Lungs:     respirations mildly labored  ?Heart:    Normal heart rate.  ?  ?MS:   All extremities are intact.  ?  ?Neurologic:  Awake, alert  ?  ? ? ? ?Data Reviewed: I have personally reviewed following labs and imaging studies ? ?CBC: ?Recent Labs  ?Lab 06/25/21 ?1317 06/26/21 ?2025  ?WBC 55.0* 44.0*  ?NEUTROABS 52.8*  --   ?HGB 11.7* 9.2*  ?HCT 34.1* 27.0*  ?MCV 81.4 81.3  ?PLT 88* 43*  ? ?Basic Metabolic Panel: ?Recent Labs  ?Lab 06/25/21 ?1317 06/26/21 ?4270  ?NA 136 139  ?K 3.9 3.6  ?CL 102 110  ?CO2 20* 16*  ?GLUCOSE 106* 88  ?BUN 61* 61*  ?CREATININE 2.90* 2.48*  ?CALCIUM 8.2* 7.2*  ? ?GFR: ?Estimated Creatinine Clearance: 22.6 mL/min (A) (by C-G formula based on SCr of 2.48 mg/dL  (H)). ?Liver Function Tests: ?Recent Labs  ?Lab 06/25/21 ?1317  ?AST 47*  ?ALT 24  ?ALKPHOS 192*  ?BILITOT 5.1*  ?PROT 6.7  ?ALBUMIN 2.2*  ? ?Recent Labs  ?Lab 06/25/21 ?1317  ?LIPASE 19  ? ?No results for input(s): AMMONIA in the last 168 hours. ?Coagulation Profile: ?Recent Labs  ?Lab 06/26/21 ?0913  ?INR 1.3*  ? ?Cardiac Enzymes: ?No results for input(s): CKTOTAL, CKMB, CKMBINDEX, TROPONINI in the last 168 hours. ?BNP (last 3 results) ?No results for input(s): PROBNP in the last 8760 hours. ?HbA1C: ?No results for input(s): HGBA1C in the last 72 hours. ?CBG: ?No results for input(s): GLUCAP in the last 168 hours. ?Lipid Profile: ?No results for input(s): CHOL, HDL, LDLCALC, TRIG, CHOLHDL, LDLDIRECT in the last 72 hours. ?Thyroid Function Tests: ?No results for input(s): TSH, T4TOTAL, FREET4, T3FREE, THYROIDAB in the last 72 hours. ?Anemia Panel: ?No results for input(s): VITAMINB12, FOLATE, FERRITIN, TIBC, IRON, RETICCTPCT in the last 72 hours. ?Sepsis Labs: ?Recent Labs  ?Lab 06/25/21 ?1427  ?LATICACIDVEN 1.8  ? ? ?Recent Results (from the past 240 hour(s))  ?Culture, blood (single)     Status: None (Preliminary result)  ? Collection Time: 06/25/21  2:15 PM  ? Specimen: Left Antecubital; Blood  ?Result Value Ref Range Status  ? Specimen Description   Final  ?  LEFT ANTECUBITAL ?Performed at [facility], [address] [place], [place] [address] ?  ? Special Requests   Final  ?  BOTTLES DRAWN AEROBIC AND ANAEROBIC Blood Culture adequate volume ?Performed at [facility], [address] [place], [place] [address] ?  ? Culture  Setup Time   Final  ?  GRAM NEGATIVE RODS ?IN BOTH AEROBIC AND ANAEROBIC BOTTLES ?Gram Stain Report Called to,Read Back By and Verified With: Conway [id number] BY THOMPSON S. ?CRITICAL RESULT CALLED TO, READ BACK BY AND VERIFIED WITH: PHARMD MASON WISE ON 06/26/21 @ 10:07 BY DRT ?Performed at [facility], [place] [address], [place], [place] [address] ?  ? Culture GRAM NEGATIVE  RODS  Final  ? Report Status PENDING  Incomplete  ?Blood Culture ID Panel (Reflexed)     Status: Abnormal  ? Collection Time: 06/25/21  2:15 PM  ?Result Value Ref Range Status  ? Enterococcus faecalis NOT DETECTED NOT DETECTED Final  ? Enterococcus Faecium NOT DETECTED NOT DETECTED Final  ? Listeria monocytogenes NOT DETECTED NOT DETECTED Final  ? Staphylococcus species NOT DETECTED NOT DETECTED Final  ? Staphylococcus aureus (BCID) NOT DETECTED NOT DETECTED Final  ? Staphylococcus epidermidis NOT DETECTED NOT DETECTED Final  ? Staphylococcus lugdunensis NOT DETECTED NOT DETECTED Final  ? Streptococcus species NOT DETECTED NOT DETECTED Final  ? Streptococcus agalactiae NOT DETECTED NOT DETECTED Final  ? Streptococcus pneumoniae NOT DETECTED NOT DETECTED Final  ?  Streptococcus pyogenes NOT DETECTED NOT DETECTED Final  ? A.calcoaceticus-baumannii NOT DETECTED NOT DETECTED Final  ? Bacteroides fragilis NOT DETECTED NOT DETECTED Final  ? Enterobacterales DETECTED (A) NOT DETECTED Final  ?  Comment: Enterobacterales represent a large order of gram negative bacteria, not a single organism. ?CRITICAL RESULT CALLED TO, READ BACK BY AND VERIFIED WITH: ?PHARMD MASON WISE ON 06/26/21 @ 10:07 BY DRT ?  ? Enterobacter cloacae complex NOT DETECTED NOT DETECTED Final  ? Escherichia coli DETECTED (A) NOT DETECTED Final  ?  Comment: CRITICAL RESULT CALLED TO, READ BACK BY AND VERIFIED WITH: ?PHARMD MASON WISE ON 06/26/21 @ 10:07 BY DRT ?  ? Klebsiella aerogenes NOT DETECTED NOT DETECTED Final  ? Klebsiella oxytoca NOT DETECTED NOT DETECTED Final  ? Klebsiella pneumoniae NOT DETECTED NOT DETECTED Final  ? Proteus species NOT DETECTED NOT DETECTED Final  ? Salmonella species NOT DETECTED NOT DETECTED Final  ? Serratia marcescens NOT DETECTED NOT DETECTED Final  ? Haemophilus influenzae NOT DETECTED NOT DETECTED Final  ? Neisseria meningitidis NOT DETECTED NOT DETECTED Final  ? Pseudomonas aeruginosa NOT DETECTED NOT DETECTED Final  ?  Stenotrophomonas maltophilia NOT DETECTED NOT DETECTED Final  ? Candida albicans NOT DETECTED NOT DETECTED Final  ? Candida auris NOT DETECTED NOT DETECTED Final  ? Candida glabrata NOT DETECTED NOT DETECTED Final

## 2021-06-26 NOTE — Consult Note (Addendum)
? ? ? ?Chief Complaint: ?Psoas abscess ?Left hydronephrosis ? ?Referring Physician(s): ?Geradine Girt, DO ? ?Supervising Physician: Mir, Sharen Heck ? ?Patient Status: [facility] - In-pt ? ?History of Present Illness: ?Ian Boyd is a 71 y.o. male with medical history significant for Hep C, blind right eye, and dysphagia.   ? ?He presented to the ED with nausea, vomiting, and diarrhea for 2 weeks duration.  ? ?He also c/o lower abdominal pain, both to the left and right side of his lower abdomen.   ? ?He reported fevers and chills at home. ? ?In the ED he was found to have a Tmax 100.5.  Heart rate 107-124.  Respiratory rate 17-34.  Leukocytosis of 55.  Troponin 66 > 42.  Lactic acid 1.8. ? ?CT showed= ?1. Expanded RIGHT psoas muscle with central gas consistent with a ?PSOAS ABSCESS versus psoas hematoma with superimposed infection. ?2. LEFT hydronephrosis and hydroureter appears to be secondary to ?calculus obstruction at the LEFT vesicoureteral junction. Calculus ?in the medial facet in the vicinity of a posterior LEFT bladder ?diverticulum with dependent stones. ?3. Large volume of gas within the bladder. ?4. Nodularity along the posterior RIGHT wall of the bladder. Cannot ?exclude bladder neoplasm. ?5. Bilateral nephrolithiasis. ?  ?We are asked to evaluate for image guided drainage of the psoas abscess and placement of a percutaneous nephrostomy tube. ? ?Past Medical History:  ?Diagnosis Date  ? Acid reflux   ? Blind right eye   ? Drug abuse ([place])   ? ? ?Past Surgical History:  ?Procedure Laterality Date  ? CATARACT EXTRACTION Left   ? ESOPHAGEAL DILATION N/A 05/12/2018  ? Procedure: ESOPHAGEAL DILATION;  Surgeon: Rogene Houston, MD;  Location: [facility] ENDO SUITE;  Service: Endoscopy;  Laterality: N/A;  ? ESOPHAGOGASTRODUODENOSCOPY N/A 05/12/2018  ? Procedure: ESOPHAGOGASTRODUODENOSCOPY (EGD);  Surgeon: Rogene Houston, MD;  Location: [facility] ENDO SUITE;  Service: Endoscopy;  Laterality: N/A;  2:45   ? ? ?Allergies: ?Patient has no known allergies. ? ?Medications: ?Prior to Admission medications   ?Medication Sig Start Date End Date Taking? Authorizing Provider  ?Albuterol Sulfate, sensor, (PROAIR DIGIHALER) 108 (90 Base) MCG/ACT AEPB Inhale 1 puff into the lungs daily as needed (shortness of breath).   Yes [provider]  ?brimonidine (ALPHAGAN) 0.2 % ophthalmic solution SMARTSIG:In Eye(s) 06/22/21  Yes [provider]  ?dorzolamide-timolol (COSOPT) 22.3-6.8 MG/ML ophthalmic solution Place 1 drop into the left eye 2 (two) times daily. 05/05/21  Yes [provider]  ?ibuprofen (ADVIL) 600 MG tablet Take 600 mg by mouth every 6 (six) hours as needed. 06/19/21  Yes [provider]  ?Multiple Vitamin (MULTIVITAMIN) tablet Take 1 tablet by mouth daily.   Yes [provider]  ?Phenyleph-Doxylamine-DM-APAP (NYQUIL SEVERE COLD/FLU PO) Take 1 Dose by mouth at bedtime as needed (congestion).   Yes [provider]  ?RHOPRESSA 0.02 % SOLN Place 1 drop into the left eye at bedtime. 05/05/21  Yes [provider]  ?tamsulosin (FLOMAX) 0.4 MG CAPS capsule Take 0.4 mg by mouth at bedtime. 04/17/21  Yes [provider]  ?tiotropium (SPIRIVA) 18 MCG inhalation capsule Place 18 mcg into inhaler and inhale daily.   Yes [provider]  ?VYZULTA 0.024 % SOLN Place 1 drop into the left eye at bedtime. 05/05/21  Yes [provider]  ?pantoprazole (PROTONIX) 40 MG tablet TAKE 1 TABLET (40 MG TOTAL) BY MOUTH 2 (TWO) TIMES DAILY BEFORE A MEAL. ?Patient not taking: Reported on 06/25/2021 08/04/18   Rehman,  Mechele Dawley, MD  ?polyethylene glycol-electrolytes (TRILYTE) 420 g solution Take 4,000 mLs by mouth as directed. ?Patient not taking: Reported on 06/25/2021 08/29/20   Harvel Quale, MD  ?  ? ?History reviewed. No pertinent family history. ? ?Social History  ? ?Socioeconomic History  ? Marital status: Single  ?  Spouse name: Not on file  ? Number of  children: Not on file  ? Years of education: Not on file  ? Highest education level: Not on file  ?Occupational History  ? Not on file  ?Tobacco Use  ? Smoking status: Every Day  ?  Packs/day: 0.50  ?  Types: Cigarettes  ? Smokeless tobacco: Never  ? Tobacco comments:  ?  1/2 pack a day  ?Vaping Use  ? Vaping Use: Never used  ?Substance and Sexual Activity  ? Alcohol use: Never  ?  Comment: quit 5-7 yrs ago.   ? Drug use: Yes  ?  Types: Cocaine, Marijuana  ?  Comment: Does marijuana and cocaine.   ? Sexual activity: Not on file  ?Other Topics Concern  ? Not on file  ?Social History Narrative  ? Not on file  ? ?Social Determinants of Health  ? ?Financial Resource Strain: Not on file  ?Food Insecurity: Not on file  ?Transportation Needs: Not on file  ?Physical Activity: Not on file  ?Stress: Not on file  ?Social Connections: Not on file  ? ? ? ?Review of Systems: A 12 point ROS discussed and pertinent positives are indicated in the HPI above.  All other systems are negative. ? ?Review of Systems ? ?Vital Signs: ?BP 117/69 (BP Location: Left Arm)   Pulse 95   Temp 98.3 ?F (36.8 ?C) (Oral)   Resp 17   Ht 6' (1.829 m)   Wt 132 lb 7.9 oz (60.1 kg)   SpO2 97%   BMI 17.97 kg/m?  ? ?Physical Exam ?Vitals reviewed.  ?Constitutional:   ?   Appearance: Normal appearance.  ?HENT:  ?   Head: Normocephalic and atraumatic.  ?Eyes:  ?   Comments: Visual impairment  ?Cardiovascular:  ?   Rate and Rhythm: Normal rate and regular rhythm.  ?Pulmonary:  ?   Effort: Pulmonary effort is normal. No respiratory distress.  ?   Breath sounds: Normal breath sounds.  ?Abdominal:  ?   Palpations: Abdomen is soft.  ?Musculoskeletal:     ?   General: Normal range of motion.  ?   Cervical back: Normal range of motion.  ?Skin: ?   General: Skin is warm and dry.  ?Neurological:  ?   General: No focal deficit present.  ?   Mental Status: He is alert and oriented to person, place, and time.  ?Psychiatric:     ?   Mood and Affect: Mood normal.      ?   Behavior: Behavior normal.     ?   Thought Content: Thought content normal.     ?   Judgment: Judgment normal.  ? ? ?Imaging: ?CT ABDOMEN PELVIS WO CONTRAST ? ?Result Date: 06/25/2021 ?CLINICAL DATA:  Abdominal pain.  Acute.  Nonlocalized. EXAM: CT ABDOMEN AND PELVIS WITHOUT CONTRAST TECHNIQUE: Multidetector CT imaging of the abdomen and pelvis was performed following the standard protocol without IV contrast. RADIATION DOSE REDUCTION: This exam was performed according to the departmental dose-optimization program which includes automated exposure control, adjustment of the mA and/or kV according to patient size and/or use of iterative reconstruction technique. COMPARISON:  None. FINDINGS:  Lower chest: Lung bases are clear. Hepatobiliary: Benign cyst in the LEFT hepatic lobe. No biliary duct dilatation. Gallbladder normal. Pancreas: Pancreas is normal. No ductal dilatation. No pancreatic inflammation. Spleen: Normal spleen Adrenals/urinary tract: Adrenal glands normal. Hydronephrosis and hydroureter the LEFT collecting system. Ureter is difficult to follow as there is little intra-abdominal fat however, there is potentially a obstructing calculus at the LEFT ureteral junction measuring 5 mm (image 73/2). This is adjacent to a large posterior diverticulum measuring 4.5 cm which collects several calcifications. Several additional calculi within the LEFT kidney. Multiple small calculi within the RIGHT kidney. No RIGHT hydroureter. Along the posterior RIGHT wall the bladder there is a focus of nodularity measuring 7 mm (image 72/2) there is significant volume of gas within the bladder. Stomach/Bowel: Stomach, small bowel, appendix, and cecum are normal. The colon and rectosigmoid colon are normal. Vascular/Lymphatic: Abdominal aorta is normal caliber. No periportal or retroperitoneal adenopathy. No pelvic adenopathy. Reproductive: Unremarkable Other: No intraperitoneal free for Musculoskeletal: There is expansion of  the RIGHT psoas muscle to 4.9 x 5.6 cm (image 54/2). there is gas centrally within the expanded muscle. IMPRESSION: 1. Expanded RIGHT psoas muscle with central gas consistent with a PSOAS ABSCESS versus psoas hematoma with superim

## 2021-06-26 NOTE — Hospital Course (Addendum)
Ian Boyd is a 71 y.o. male with medical history significant for Hep C, blind right eye, dysphagia.  Patient presented to the ED with complaint of nausea vomiting diarrhea of about 2 weeks duration.  He also reports lower abdominal pain, both to the left and right side of his lower abdomen.  He presented to [facility], he was given a shot of an injection, and subsequently he declined even further, he could not walk, and then he also could not move his right arm.  Patient has had problems with his right shoulder for at least 3 years, but this significantly worsened over the past few days.  Found to have ecoli bacteremia with a psoas abscess.  Slowly improving. Will need SNF once plan for abx in place.  ?

## 2021-06-26 NOTE — Progress Notes (Signed)
[facility] admits text paged and notified of patients arrival.  ?

## 2021-06-26 NOTE — Procedures (Signed)
Interventional Radiology Procedure Note ? ?Procedure: 1. Left percutaneous nephrostomy 2. Right psoas abscess drain ? ?Indication: 1. Left hydronephrosis 2. Right psoas abscess ? ?Findings: Please refer to procedural dictation for full description. ? ?Complications: None ? ?EBL: < 10 mL ? ?Miachel Roux, MD ?[phone] ? ? ?

## 2021-06-26 NOTE — Progress Notes (Signed)
PHARMACY - PHYSICIAN COMMUNICATION ?CRITICAL VALUE ALERT - BLOOD CULTURE IDENTIFICATION (BCID) ? ?Ian Boyd is an 72 y.o. male who presented to [facility] on 06/25/2021 with a chief complaint of nausea, vomiting, and diarrhea for 2 weeks. Patient also experienced pain with urination and fever at home. Patient reported lower abdominal pain. ? ?Assessment:  Patient has a psoas abscess, suspected UTI from calculus obstruction, and now bacteremia. WBC is elevated at 44.0 and current temperature is 98.28F. Most recent blood pressure is 117/69. Lactic acid 06/25/21 was 1.8.  ? ?Name of physician (or Provider) Contacted: Dr. Eulogio Bear ? ?Current antibiotics: Cefepime, metronidazole, and vancomycin ? ?Changes to prescribed antibiotics recommended:  ?Stop vancomycin, cefepime, and metronidazole ?Start Ceftriaxone 2g every 24 hours ?Recommendations accepted by provider ? ?Results for orders placed or performed during the hospital encounter of 06/25/21  ?Blood Culture ID Panel (Reflexed) (Collected: 06/25/2021  2:15 PM)  ?Result Value Ref Range  ? Enterococcus faecalis NOT DETECTED NOT DETECTED  ? Enterococcus Faecium NOT DETECTED NOT DETECTED  ? Listeria monocytogenes NOT DETECTED NOT DETECTED  ? Staphylococcus species NOT DETECTED NOT DETECTED  ? Staphylococcus aureus (BCID) NOT DETECTED NOT DETECTED  ? Staphylococcus epidermidis NOT DETECTED NOT DETECTED  ? Staphylococcus lugdunensis NOT DETECTED NOT DETECTED  ? Streptococcus species NOT DETECTED NOT DETECTED  ? Streptococcus agalactiae NOT DETECTED NOT DETECTED  ? Streptococcus pneumoniae NOT DETECTED NOT DETECTED  ? Streptococcus pyogenes NOT DETECTED NOT DETECTED  ? A.calcoaceticus-baumannii NOT DETECTED NOT DETECTED  ? Bacteroides fragilis NOT DETECTED NOT DETECTED  ? Enterobacterales DETECTED (A) NOT DETECTED  ? Enterobacter cloacae complex NOT DETECTED NOT DETECTED  ? Escherichia coli DETECTED (A) NOT DETECTED  ? Klebsiella aerogenes NOT DETECTED NOT DETECTED  ?  Klebsiella oxytoca NOT DETECTED NOT DETECTED  ? Klebsiella pneumoniae NOT DETECTED NOT DETECTED  ? Proteus species NOT DETECTED NOT DETECTED  ? Salmonella species NOT DETECTED NOT DETECTED  ? Serratia marcescens NOT DETECTED NOT DETECTED  ? Haemophilus influenzae NOT DETECTED NOT DETECTED  ? Neisseria meningitidis NOT DETECTED NOT DETECTED  ? Pseudomonas aeruginosa NOT DETECTED NOT DETECTED  ? Stenotrophomonas maltophilia NOT DETECTED NOT DETECTED  ? Candida albicans NOT DETECTED NOT DETECTED  ? Candida auris NOT DETECTED NOT DETECTED  ? Candida glabrata NOT DETECTED NOT DETECTED  ? Candida krusei NOT DETECTED NOT DETECTED  ? Candida parapsilosis NOT DETECTED NOT DETECTED  ? Candida tropicalis NOT DETECTED NOT DETECTED  ? Cryptococcus neoformans/gattii NOT DETECTED NOT DETECTED  ? CTX-M ESBL NOT DETECTED NOT DETECTED  ? Carbapenem resistance IMP NOT DETECTED NOT DETECTED  ? Carbapenem resistance KPC NOT DETECTED NOT DETECTED  ? Carbapenem resistance NDM NOT DETECTED NOT DETECTED  ? Carbapenem resist OXA 48 LIKE NOT DETECTED NOT DETECTED  ? Carbapenem resistance VIM NOT DETECTED NOT DETECTED  ? ? ?Thank you for allowing pharmacy to participate in this patient's care. ? ?Reatha Harps, PharmD ?PGY1 Pharmacy Resident ?06/26/2021 10:28 AM ?Check [email or web address] for unit specific pharmacy number ? ? ?

## 2021-06-26 NOTE — Progress Notes (Addendum)
Pt received from [facility] via care link. AO x4. Denies any pain at the time of arrival. Vitals stable except slightly tachycardic. CHG wipe completed, connected to tele and CCMD notified. Oriented patient to room and call bell system. Bed alarm on. Will continue to monitor.  ?

## 2021-06-27 ENCOUNTER — Inpatient Hospital Stay (HOSPITAL_COMMUNITY): Payer: Medicare Other

## 2021-06-27 DIAGNOSIS — N179 Acute kidney failure, unspecified: Secondary | ICD-10-CM | POA: Diagnosis not present

## 2021-06-27 DIAGNOSIS — N1330 Unspecified hydronephrosis: Secondary | ICD-10-CM | POA: Diagnosis not present

## 2021-06-27 DIAGNOSIS — E876 Hypokalemia: Secondary | ICD-10-CM

## 2021-06-27 DIAGNOSIS — R7881 Bacteremia: Secondary | ICD-10-CM

## 2021-06-27 DIAGNOSIS — D696 Thrombocytopenia, unspecified: Secondary | ICD-10-CM

## 2021-06-27 DIAGNOSIS — K6812 Psoas muscle abscess: Secondary | ICD-10-CM | POA: Diagnosis not present

## 2021-06-27 DIAGNOSIS — B9620 Unspecified Escherichia coli [E. coli] as the cause of diseases classified elsewhere: Secondary | ICD-10-CM

## 2021-06-27 LAB — CBC
HCT: 27.3 % — ABNORMAL LOW (ref 39.0–52.0)
Hemoglobin: 9.3 g/dL — ABNORMAL LOW (ref 13.0–17.0)
MCH: 27.7 pg (ref 26.0–34.0)
MCHC: 34.1 g/dL (ref 30.0–36.0)
MCV: 81.3 fL (ref 80.0–100.0)
Platelets: 62 10*3/uL — ABNORMAL LOW (ref 150–400)
RBC: 3.36 MIL/uL — ABNORMAL LOW (ref 4.22–5.81)
RDW: 15.1 % (ref 11.5–15.5)
WBC: 24 10*3/uL — ABNORMAL HIGH (ref 4.0–10.5)
nRBC: 0 % (ref 0.0–0.2)

## 2021-06-27 LAB — BASIC METABOLIC PANEL
Anion gap: 7 (ref 5–15)
BUN: 50 mg/dL — ABNORMAL HIGH (ref 8–23)
CO2: 19 mmol/L — ABNORMAL LOW (ref 22–32)
Calcium: 7.6 mg/dL — ABNORMAL LOW (ref 8.9–10.3)
Chloride: 112 mmol/L — ABNORMAL HIGH (ref 98–111)
Creatinine, Ser: 1.69 mg/dL — ABNORMAL HIGH (ref 0.61–1.24)
GFR, Estimated: 42 mL/min — ABNORMAL LOW (ref >60)
Glucose, Bld: 92 mg/dL (ref 70–99)
Potassium: 3.2 mmol/L — ABNORMAL LOW (ref 3.5–5.1)
Sodium: 138 mmol/L (ref 135–145)

## 2021-06-27 LAB — BPAM PLATELET PHERESIS
Blood Product Expiration Date: 202304072359
Blood Product Expiration Date: 202304082359
ISSUE DATE / TIME: 202304061137
ISSUE DATE / TIME: 202304061541
Unit Type and Rh: 5100
Unit Type and Rh: 7300

## 2021-06-27 LAB — PREPARE PLATELET PHERESIS
Unit division: 0
Unit division: 0

## 2021-06-27 LAB — URINE CULTURE

## 2021-06-27 MED ORDER — OXYCODONE HCL 5 MG PO TABS
5.0000 mg | ORAL_TABLET | Freq: Four times a day (QID) | ORAL | Status: DC | PRN
  Administered 2021-06-27 – 2021-07-03 (×14): 5 mg via ORAL
  Filled 2021-06-27 (×14): qty 1

## 2021-06-27 MED ORDER — POTASSIUM CHLORIDE CRYS ER 20 MEQ PO TBCR
40.0000 meq | EXTENDED_RELEASE_TABLET | ORAL | Status: AC
  Administered 2021-06-27 (×2): 40 meq via ORAL
  Filled 2021-06-27 (×2): qty 2

## 2021-06-27 MED ORDER — LIP MEDEX EX OINT
TOPICAL_OINTMENT | CUTANEOUS | Status: DC | PRN
  Administered 2021-06-27: 75 via TOPICAL
  Filled 2021-06-27: qty 7

## 2021-06-27 NOTE — Progress Notes (Signed)
Inpatient Rehab Admissions Coordinator:  ? ? I met with pt. And spoke with pt.'s sister over the phone to discuss potential CIR admit. They are interested and pt.'s sister states that after discharge, she will likely move Pt. In with her mother, as she has close to 24/7 care at her home. I will submit Pt.'s case to insurance and follow for potential admit pending insurance auth. ? ?Clemens Catholic, MS, CCC-SLP ?Rehab Admissions Coordinator  ?[phone] (celll) ?[phone] (office) ? ?

## 2021-06-27 NOTE — Evaluation (Signed)
Occupational Therapy Evaluation Patient Details Name: Ian Boyd MRN: [id number] DOB: 30-May-1950 Today's Date: 06/27/2021   History of Present Illness 71 yo male admitted with N/V/D inability to move R arm, difficulty walking and expressive deficts. 4/5 admit due to sepsis CT abdomen pelvis shows probable psoas abscess v/s psoas hematoma with superimposed infection (+) crack cocaine use 4/6 Left percutaneous nephrostomy 2. Right psoas abscess drain. MRI of brain with no acute intracranial abnormality. PMH blind R eye , drug use, smoker   Clinical Impression   Patient is s/p sepsis and  Left percutaneous nephrostomy and Right psoas abscess drainsurgery resulting in functional limitations due to the deficits listed below (see OT problem list). Pt progressed eob to chair this session. Recommend use of hoyer if pt fatigues with Engineer, manufacturing. Pt total +2 mod (A) to stand pivot.  Patient will benefit from skilled OT acutely to increase independence and safety with ADLS to allow discharge SNF.  Called sister [name] and spoke with her regarding home (A) and she reports concerns for dementia. Pt has not (A) for d/c       Recommendations for follow up therapy are one component of a multi-disciplinary discharge planning process, led by the attending physician.  Recommendations may be updated based on patient status, additional functional criteria and insurance authorization.   Follow Up Recommendations  Skilled nursing-short term rehab (<3 hours/day)    Assistance Recommended at Discharge Intermittent Supervision/Assistance  Patient can return home with the following Two people to help with walking and/or transfers;Two people to help with bathing/dressing/bathroom;Assistance with cooking/housework;Assistance with feeding;Direct supervision/assist for medications management;Direct supervision/assist for financial management;Assist for transportation;Help with stairs or ramp for entrance    Functional  Status Assessment  Patient has had a recent decline in their functional status and demonstrates the ability to make significant improvements in function in a reasonable and predictable amount of time.  Equipment Recommendations  Wheelchair (measurements OT);Wheelchair cushion (measurements OT);Hospital bed    Recommendations for Other Services Speech consult (cognition)     Precautions / Restrictions Precautions Precautions: Fall Precaution Comments: R groin JP drain; L abdomen pleural drain; mass R neck/shoulder region Restrictions Weight Bearing Restrictions: No      Mobility Bed Mobility Overal bed mobility: Needs Assistance Bed Mobility: Supine to Sit     Supine to sit: Mod assist, +2 for physical assistance, HOB elevated     General bed mobility comments: Cues to bring legs off R EOB, needing assistance to manage legs and to ascend trunk from elevated HOB, mod+2.    Transfers Overall transfer level: Needs assistance Equipment used: 2 person hand held assist Transfers: Sit to/from Stand, Bed to chair/wheelchair/BSC Sit to Stand: Min assist, +2 physical assistance, +2 safety/equipment     Step pivot transfers: Mod assist, +2 physical assistance, +2 safety/equipment     General transfer comment: Bil knee block provided with bil UE support, coming to stand 2x from EOB with minAx2, good power up noted by pt. ModAx2 with bil UE support provided to steady and direct pt with stand step to R bed > recliner.      Balance Overall balance assessment: Needs assistance Sitting-balance support: Single extremity supported, Bilateral upper extremity supported, Feet supported Sitting balance-Leahy Scale: Poor Sitting balance - Comments: UE support and min guard-minA to sit statically EOB Postural control: Other (comment) (anterior lean intermittently) Standing balance support: Bilateral upper extremity supported, During functional activity Standing balance-Leahy Scale:  Poor Standing balance comment: Reliant on bil UE support and  min-modAx2, cues to increase upright posture, intermittently leaning anteriorly.                           ADL either performed or assessed with clinical judgement   ADL Overall ADL's : Needs assistance/impaired Eating/Feeding: Moderate assistance;Bed level Eating/Feeding Details (indicate cue type and reason): tremor noted Grooming: Maximal assistance;Bed level   Upper Body Bathing: Total assistance;Bed level   Lower Body Bathing: Total assistance;Bed level   Upper Body Dressing : Maximal assistance;Sitting   Lower Body Dressing: Maximal assistance;Bed level   Toilet Transfer: +2 for physical assistance;Moderate assistance             General ADL Comments: pt progressed to chair this session     Vision Baseline Vision/History: 1 Wears glasses       Perception     Praxis      Pertinent Vitals/Pain Pain Assessment Pain Assessment: 0-10 Pain Score: 5  Pain Location: R shoulder, R leg, back Pain Descriptors / Indicators: Discomfort, Grimacing, Guarding Pain Intervention(s): Monitored during session, Repositioned     Hand Dominance Right   Extremity/Trunk Assessment Upper Extremity Assessment Upper Extremity Assessment: RUE deficits/detail RUE Deficits / Details: AROM hand wrist, AAROM shoulder flexion extension and adduction. pt needs increased cues to activated. pt cant not hold against gravity pt with scapula retraction present but weaker than L side. SIster reports pendin Ortho referral for shoulder   Lower Extremity Assessment Lower Extremity Assessment: Defer to PT evaluation RLE Deficits / Details: Gross weakness but fairly symmetrical bil with MMT supine in bed, grossly 4 to 4+; limited AROM into combination of R hip and knee flexion supine in bed RLE Sensation:  (denied numbness/tingling) LLE Deficits / Details: Gross weakness but fairly symmetrical bil with MMT supine in bed, grossly  4 to 4+; WFL AROM LLE Sensation:  (denied numbness/tingling)   Cervical / Trunk Assessment Cervical / Trunk Assessment: Kyphotic;Other exceptions (R inferior scapula fluid pocket noted / rn called to room to look at fluid mas)   Communication Communication Communication: No difficulties   Cognition Arousal/Alertness: Awake/alert Behavior During Therapy: Flat affect Overall Cognitive Status: Impaired/Different from baseline Area of Impairment: Orientation, Attention, Following commands, Safety/judgement, Awareness, Problem solving, Memory                 Orientation Level: Disoriented to, Time, Situation Current Attention Level: Sustained Memory: Decreased short-term memory Following Commands: Follows one step commands consistently, Follows one step commands with increased time Safety/Judgement: Decreased awareness of safety, Decreased awareness of deficits Awareness: Emergent Problem Solving: Slow processing, Decreased initiation, Difficulty sequencing, Requires verbal cues, Requires tactile cues General Comments: Read calendar start of session but thought this weekend was Valentine's day, indicating memory deficits. Pt unable to identify situation. Increased time to process and initiate simple tasks when cued, needing repetition of cues at times. Pt reports two saturdays ago but couldnt tell date or current date so time frame uncertain without family confirmation.     General Comments  BP Map (110) consistently RN made aware, HR 80s entire session RA pt with edema mass at R superior scapula / neck area and shoulder pain    Exercises     Shoulder Instructions      Home Living Family/patient expects to be discharged to:: Private residence Living Arrangements: Children (adult son) Available Help at Discharge: Family;Available PRN/intermittently (unsure if sister can assist when son is at work) Type of Home: House Home Access: Stairs  to enter Entrance Stairs-Number of Steps:  3 Entrance Stairs-Rails: None Home Layout: One level     Bathroom Shower/Tub: Chief Strategy Officer: Handicapped height (toilet riser applied)     Home Equipment: Hand held shower head;Toilet riser   Additional Comments: reports son lives with him but works      Prior Functioning/Environment Prior Level of Function : History of Falls (last six months)             Mobility Comments: x1 fall in past 6 months ~1 week PTA. Reports he has been shuffling his feet holding onto stuff the week PTA but was not shuffling his feet several weeks ago. ADLs Comments: Can drive, but does not. Wears glasses for reading. Sister manages finances.        OT Problem List: Decreased strength;Decreased activity tolerance;Impaired balance (sitting and/or standing);Decreased range of motion;Decreased coordination;Decreased cognition;Impaired vision/perception;Decreased safety awareness;Decreased knowledge of use of DME or AE;Decreased knowledge of precautions;Cardiopulmonary status limiting activity;Impaired sensation;Impaired UE functional use;Pain      OT Treatment/Interventions: Self-care/ADL training;Therapeutic exercise;Neuromuscular education;Energy conservation;DME and/or AE instruction;Manual therapy;Therapeutic activities;Patient/family education;Balance training;Cognitive remediation/compensation;Modalities    OT Goals(Current goals can be found in the care plan section) Acute Rehab OT Goals Patient Stated Goal: to get something else to drink OT Goal Formulation: Patient unable to participate in goal setting Time For Goal Achievement: 07/11/21 Potential to Achieve Goals: Good  OT Frequency: Min 2X/week    Co-evaluation PT/OT/SLP Co-Evaluation/Treatment: Yes Reason for Co-Treatment: For patient/therapist safety;To address functional/ADL transfers;Necessary to address cognition/behavior during functional activity;Complexity of the patient's impairments (multi-system  involvement) PT goals addressed during session: Mobility/safety with mobility;Balance OT goals addressed during session: ADL's and self-care;Proper use of Adaptive equipment and DME      AM-PAC OT "6 Clicks" Daily Activity     Outcome Measure Help from another person eating meals?: A Lot Help from another person taking care of personal grooming?: A Lot Help from another person toileting, which includes using toliet, bedpan, or urinal?: A Lot Help from another person bathing (including washing, rinsing, drying)?: A Lot Help from another person to put on and taking off regular upper body clothing?: A Lot Help from another person to put on and taking off regular lower body clothing?: A Lot 6 Click Score: 12   End of Session Equipment Utilized During Treatment: Gait belt Nurse Communication: Mobility status;Precautions;Need for lift equipment  Activity Tolerance: Patient tolerated treatment well Patient left: in chair;with call bell/phone within reach;with chair alarm set;Other (comment) (lift pad placed)  OT Visit Diagnosis: Unsteadiness on feet (R26.81);Muscle weakness (generalized) (M62.81)                Time: 0865-7846 OT Time Calculation (min): 32 min Charges:  OT General Charges $OT Visit: 1 Visit OT Evaluation $OT Eval Moderate Complexity: 1 Mod   Brynn, OTR/L  Acute Rehabilitation Services Pager: [phone] Office: [phone] .   Mateo Flow 06/27/2021, 12:19 PM

## 2021-06-27 NOTE — Assessment & Plan Note (Addendum)
-  replete °

## 2021-06-27 NOTE — Progress Notes (Signed)
? ?PROGRESS NOTE ? ? ? ?Ian Boyd  RXV:[id number] DOB: 1951/02/01 DOA: 06/25/2021 ?PCP: Curlene Labrum, MD ? ? ?Brief Narrative: ?Ian Boyd is a 71 y.o. male with medical history significant for Hep C, blind right eye, dysphagia.  Patient presented to the ED with complaint of nausea vomiting diarrhea of about 2 weeks duration.  He also reports lower abdominal pain, both to the left and right side of his lower abdomen.  He presented to [facility], he was given a shot of an injection, and subsequently he declined even further, he could not walk, and then he also could not move his right arm.  Patient has had problems with his right shoulder for at least 3 years, but this significantly worsened over the past few days.  Found to have ecoli bacteremia with a psoas abscess.  Currently on Ceftriaxone. PT/OT recommendations for rehab when medically stable for discharge. ? ? ?Assessment and Plan: ?* Psoas abscess, right ([place]) ?CT findings concerning for right psoas abscess vs hematoma. Drain placed by IR on 4/6 with preliminary culture significant for gram negative rods ?-Continue drain per IR ?-Continue Ceftriaxone ?-Follow-up wound culture ? ?AKI (acute kidney injury) ([place]) ?Creatinine of 2.9 on admission.  Improving after nephrostomy tube placement.  ? ?Hydronephrosis of left kidney ?Hydronephrosis of the left kidney, with hydroureter, secondary to calculus obstruction at the left vesicoureteral junction.  Also with  urinary tract infection. Evaluated by urologist Dr. Alyson Ingles with recommendation for left nephrostomy tube placement which was performed on 4/6. ? ?Severe sepsis (HCC)-resolved as of 06/27/2021 ?Secondary to bacteremia, psoas abscess and UTI. Physiology resolved. ? ?Rotator cuff tear arthropathy ?X-ray suggesting chronic rotator cuff injury.  Reports right shoulder problems for about 3 years now although now reports new right shoulder mass. MRI brain negative. ?-OT ?-MRI  shoulder ? ?Urinary tract infection ?Symptomatic with dysuria, UA with positive leukocytes, with severe sepsis. Complicated by obstructive ureteral calculus. Urine culture with multiple species. ?-Continue Ceftriaxone for bacteremia and abscess ? ?Cocaine use ?Counseled on cessation. ? ?Hypokalemia ?Potassium of 3.2 on BMP today ?-Kdur ? ?Thrombocytopenia ([place]) ?Unsure if acute or chronic. It is possible this is acute in setting of severe sepsis. Platelets of 88,000 on admission. Low of 43,000 and now rising. Some blood from nephrostomy tube but hemoglobin is currently stable. ?-CBC in AM ? ?E coli bacteremia ?Presumed urinary source, although urine culture unhelpful. Leukocytosis improving. ?-Continue Ceftriaxone ?-Await blood culture sensitivities ? ? ?DVT prophylaxis: SCDs ?Code Status:   Code Status: Full Code ?Family Communication: Sister on telephone ?Disposition Plan: Discharge to CIR vs SNF if CIR does not accept. Medically stable for discharge likely in 1-2 days pending culture data, transition to oral antibiotics, IR recommendations, MRI results ? ? ?Consultants:  ?Interventional radiology ?Urology ? ?Procedures:  ?Left percutaneous nephrostomy ?Right psoas abscess drain ? ?Antimicrobials: ?Ceftriaxone  ? ? ?Subjective: ?Patient reports no significant issues today. No shoulder pain although unable to raise it as usual. Afebrile overnight. ? ?Objective: ?BP (!) 149/85 (BP Location: Left Arm)   Pulse (P) 87   Temp (P) 98 ?F (36.7 ?C) (Oral)   Resp (P) 18   Ht 6' (1.829 m)   Wt 60.1 kg   SpO2 96%   BMI 17.97 kg/m?  ? ?Examination: ? ?General exam: Appears calm and comfortable ?Respiratory system: Clear to auscultation. Respiratory effort normal. ?Cardiovascular system: S1 & S2 heard, RRR. No murmurs, rubs, gallops or clicks. ?Gastrointestinal system: Abdomen is nondistended, soft and nontender. No  organomegaly or masses felt. Normal bowel sounds heard. Left nephrostomy tube with bloody  drainage ?Central nervous system: Alert and oriented. No focal neurological deficits. ?Musculoskeletal: No edema. No calf tenderness. Right shoulder with large, soft, non-tender mass near base of neck ?Skin: No cyanosis. No rashes ?Psychiatry: Judgement and insight appear normal. Mood & affect appropriate.  ? ? ?Data Reviewed: I have personally reviewed following labs and imaging studies ? ?CBC ?Lab Results  ?Component Value Date  ? WBC 24.0 (H) 06/27/2021  ? RBC 3.36 (L) 06/27/2021  ? HGB 9.3 (L) 06/27/2021  ? HCT 27.3 (L) 06/27/2021  ? MCV 81.3 06/27/2021  ? MCH 27.7 06/27/2021  ? PLT 62 (L) 06/27/2021  ? MCHC 34.1 06/27/2021  ? RDW 15.1 06/27/2021  ? LYMPHSABS 1.1 06/25/2021  ? MONOABS 1.1 (H) 06/25/2021  ? EOSABS 0.0 06/25/2021  ? BASOSABS 0.0 06/25/2021  ? ? ? ?Last metabolic panel ?Lab Results  ?Component Value Date  ? NA 138 06/27/2021  ? K 3.2 (L) 06/27/2021  ? CL 112 (H) 06/27/2021  ? CO2 19 (L) 06/27/2021  ? BUN 50 (H) 06/27/2021  ? CREATININE 1.69 (H) 06/27/2021  ? GLUCOSE 92 06/27/2021  ? GFRNONAA 42 (L) 06/27/2021  ? CALCIUM 7.6 (L) 06/27/2021  ? PROT 6.7 06/25/2021  ? ALBUMIN 2.2 (L) 06/25/2021  ? BILITOT 5.1 (H) 06/25/2021  ? ALKPHOS 192 (H) 06/25/2021  ? AST 47 (H) 06/25/2021  ? ALT 24 06/25/2021  ? ANIONGAP 7 06/27/2021  ? ? ?GFR: ?Estimated Creatinine Clearance: 33.1 mL/min (A) (by C-G formula based on SCr of 1.69 mg/dL (H)). ? ?Recent Results (from the past 240 hour(s))  ?Culture, blood (single)     Status: Abnormal (Preliminary result)  ? Collection Time: 06/25/21  2:15 PM  ? Specimen: Left Antecubital; Blood  ?Result Value Ref Range Status  ? Specimen Description   Final  ?  LEFT ANTECUBITAL ?Performed at [facility], [address] [place], [place] [address] ?  ? Special Requests   Final  ?  BOTTLES DRAWN AEROBIC AND ANAEROBIC Blood Culture adequate volume ?Performed at [facility], [address] [place], [place] [address] ?  ? Culture  Setup Time   Final  ?  GRAM NEGATIVE RODS ?IN BOTH  AEROBIC AND ANAEROBIC BOTTLES ?Gram Stain Report Called to,Read Back By and Verified With: [place] [id number] BY THOMPSON S. ?CRITICAL RESULT CALLED TO, READ BACK BY AND VERIFIED WITH: PHARMD MASON WISE ON 06/26/21 @ 10:07 BY DRT ?  ? Culture (A)  Final  ?  ESCHERICHIA COLI ?SUSCEPTIBILITIES TO FOLLOW ?Performed at [facility], [address] [place], [place] [address] ?  ? Report Status PENDING  Incomplete  ?Blood Culture ID Panel (Reflexed)     Status: Abnormal  ? Collection Time: 06/25/21  2:15 PM  ?Result Value Ref Range Status  ? Enterococcus faecalis NOT DETECTED NOT DETECTED Final  ? Enterococcus Faecium NOT DETECTED NOT DETECTED Final  ? Listeria monocytogenes NOT DETECTED NOT DETECTED Final  ? Staphylococcus species NOT DETECTED NOT DETECTED Final  ? Staphylococcus aureus (BCID) NOT DETECTED NOT DETECTED Final  ? Staphylococcus epidermidis NOT DETECTED NOT DETECTED Final  ? Staphylococcus lugdunensis NOT DETECTED NOT DETECTED Final  ? Streptococcus species NOT DETECTED NOT DETECTED Final  ? Streptococcus agalactiae NOT DETECTED NOT DETECTED Final  ? Streptococcus pneumoniae NOT DETECTED NOT DETECTED Final  ? Streptococcus pyogenes NOT DETECTED NOT DETECTED Final  ? A.calcoaceticus-baumannii NOT DETECTED NOT DETECTED Final  ?  Bacteroides fragilis NOT DETECTED NOT DETECTED Final  ? Enterobacterales DETECTED (A) NOT DETECTED Final  ?  Comment: Enterobacterales represent a large order of gram negative bacteria, not a single organism. ?CRITICAL RESULT CALLED TO, READ BACK BY AND VERIFIED WITH: ?PHARMD MASON WISE ON 06/26/21 @ 10:07 BY DRT ?  ? Enterobacter cloacae complex NOT DETECTED NOT DETECTED Final  ? Escherichia coli DETECTED (A) NOT DETECTED Final  ?  Comment: CRITICAL RESULT CALLED TO, READ BACK BY AND VERIFIED WITH: ?PHARMD MASON WISE ON 06/26/21 @ 10:07 BY DRT ?  ? Klebsiella aerogenes NOT DETECTED NOT DETECTED Final  ? Klebsiella oxytoca NOT DETECTED NOT DETECTED Final  ? Klebsiella  pneumoniae NOT DETECTED NOT DETECTED Final  ? Proteus species NOT DETECTED NOT DETECTED Final  ? Salmonella species NOT DETECTED NOT DETECTED Final  ? Serratia marcescens NOT DETECTED NOT DETECTED Final  ? Haemophilus influenzae NOT DETECTED NOT DETEC

## 2021-06-27 NOTE — Assessment & Plan Note (Addendum)
Unsure if acute or chronic. It is possible this is acute in setting of severe sepsis. Platelets of 88,000 on admission. Low of 43,000 and now rising. Some blood from nephrostomy tube but hemoglobin is currently stable. ?-CBC daily ?

## 2021-06-27 NOTE — Progress Notes (Signed)
? ?  Inpatient Rehab Admissions Coordinator : ? ?Per therapy recommendations, patient was screened for CIR candidacy by Macedonio Scallon RN MSN.  At this time patient appears to be a potential candidate for CIR. I will place a rehab consult per protocol for full assessment. Please call me with any questions. ? ?Latorsha Curling RN MSN ?Admissions Coordinator ?[phone] ?  ?

## 2021-06-27 NOTE — Evaluation (Addendum)
Addendum 12:46 06/27/21: OT communicated with pt's sister in which the sister expressed limited support available at home. Thus, updated d/c recs from [facility] to [facility].  Raymond Gurney, PT, DPT Acute Rehabilitation Services  Pager: [phone] Office: [phone]   Physical Therapy Evaluation Patient Details Name: Ian Boyd MRN: [id number] DOB: 15-May-1950 Today's Date: 06/27/2021  History of Present Illness  71 yo male admitted with N/V/D inability to move R arm, difficulty walking and expressive deficts. 4/5 admit due to sepsis CT abdomen pelvis shows probable psoas abscess v/s psoas hematoma with superimposed infection (+) crack cocaine use 4/6 Left percutaneous nephrostomy 2. Right psoas abscess drain. MRI of brain with no acute intracranial abnormality. PMH blind R eye , drug use, smoker   Clinical Impression  Pt presents with condition above and deficits mentioned below, see PT Problem List. PTA, he was IND without DME, living with his son in a 1-level house with 3 STE. Pt reports he began shuffling his feet and holding onto objects for support ~1 week ago and had x1 fall a week PTA, but denies any other falls in the past 6 months. Currently, pt displays R UE weakness, bil lower extremity weakness (but symmetrical weakness in bil legs but R knee and hip flexion combo AROM is decreased), balance deficits, and decreased activity tolerance. He is at high risk for subsequent falls. He required modAx2 for bed mobility, minAx2 to come to stand, and modAx2 to take a couple steps bed > recliner with UE support. Noted edema/mass at R neck/shoulder, notified RN. Due to pt's significant functional decline and limited family support, recommending short-term rehab at [facility]. Will continue to follow acutely. If pt can get the support needed to go to [facility], then would recommend [facility].     Recommendations for follow up therapy are one component of a multi-disciplinary discharge planning process, led by the  attending physician.  Recommendations may be updated based on patient status, additional functional criteria and insurance authorization.  Follow Up Recommendations Skilled nursing-short term rehab (<3 hours/day) vs Acute Inpatient Rehab pending available home support    Assistance Recommended at Discharge Frequent or constant Supervision/Assistance  Patient can return home with the following  Two people to help with walking and/or transfers;A lot of help with bathing/dressing/bathroom;Assistance with cooking/housework;Direct supervision/assist for medications management;Direct supervision/assist for financial management;Assist for transportation;Help with stairs or ramp for entrance    Equipment Recommendations Rolling walker (2 wheels);BSC/3in1  Recommendations for Other Services       Functional Status Assessment Patient has had a recent decline in their functional status and demonstrates the ability to make significant improvements in function in a reasonable and predictable amount of time.     Precautions / Restrictions Precautions Precautions: Fall Precaution Comments: R groin JP drain; L abdomen pleural drain; mass R neck/shoulder region Restrictions Weight Bearing Restrictions: No      Mobility  Bed Mobility Overal bed mobility: Needs Assistance Bed Mobility: Supine to Sit     Supine to sit: Mod assist, +2 for physical assistance, HOB elevated     General bed mobility comments: Cues to bring legs off R EOB, needing assistance to manage legs and to ascend trunk from elevated HOB, mod+2.    Transfers Overall transfer level: Needs assistance Equipment used: 2 person hand held assist Transfers: Sit to/from Stand, Bed to chair/wheelchair/BSC Sit to Stand: Min assist, +2 physical assistance, +2 safety/equipment   Step pivot transfers: Mod assist, +2 physical assistance, +2 safety/equipment  General transfer comment: Bil knee block provided with bil UE support,  coming to stand 2x from EOB with minAx2, good power up noted by pt. ModAx2 with bil UE support provided to steady and direct pt with stand step to R bed > recliner.    Ambulation/Gait Ambulation/Gait assistance: Mod assist Gait Distance (Feet): 2 Feet Assistive device: 2 person hand held assist Gait Pattern/deviations: Step-through pattern, Decreased stride length, Shuffle, Trunk flexed Gait velocity: reduced Gait velocity interpretation: <1.31 ft/sec, indicative of household ambulator   General Gait Details: Pt with slow, shuffling steps to R bed > recliner with bil UE support and modAx2 to steady. Cues to improve upright posture.  Stairs            Wheelchair Mobility    Modified Rankin (Stroke Patients Only) Modified Rankin (Stroke Patients Only) Pre-Morbid Rankin Score: No symptoms Modified Rankin: Moderately severe disability     Balance Overall balance assessment: Needs assistance Sitting-balance support: Single extremity supported, Bilateral upper extremity supported, Feet supported Sitting balance-Leahy Scale: Poor Sitting balance - Comments: UE support and min guard-minA to sit statically EOB Postural control: Other (comment) (anterior lean intermittently) Standing balance support: Bilateral upper extremity supported, During functional activity Standing balance-Leahy Scale: Poor Standing balance comment: Reliant on bil UE support and min-modAx2, cues to increase upright posture, intermittently leaning anteriorly.                             Pertinent Vitals/Pain Pain Assessment Pain Assessment: 0-10 Pain Score: 5  Pain Location: R shoulder, R leg, back Pain Descriptors / Indicators: Discomfort, Grimacing, Guarding Pain Intervention(s): Limited activity within patient's tolerance, Monitored during session, Repositioned    Home Living Family/patient expects to be discharged to:: Private residence Living Arrangements: Children (adult son) Available  Help at Discharge: Family;Available PRN/intermittently (unsure if sister can assist when son is at work) Type of Home: House Home Access: Stairs to enter Entrance Stairs-Rails: None Entrance Stairs-Number of Steps: 3   Home Layout: One level Home Equipment: Hand held shower head;Toilet riser Additional Comments: reports son lives with him but works    Prior Function Prior Level of Function : History of Falls (last six months)             Mobility Comments: x1 fall in past 6 months ~1 week PTA. Reports he has been shuffling his feet holding onto stuff the week PTA but was not shuffling his feet several weeks ago. ADLs Comments: Can drive, but does not. Wears glasses for reading. Sister manages finances.     Hand Dominance   Dominant Hand: Right    Extremity/Trunk Assessment   Upper Extremity Assessment Upper Extremity Assessment: RUE deficits/detail RUE Deficits / Details: AROM hand wrist, AAROM shoulder flexion extension and adduction. pt needs increased cues to activated. pt cant not hold against gravity pt with scapula retraction present but weaker than L side. SIster reports pendin Ortho referral for shoulder    Lower Extremity Assessment Lower Extremity Assessment: Defer to PT evaluation RLE Deficits / Details: Gross weakness but fairly symmetrical bil with MMT supine in bed, grossly 4 to 4+; limited AROM into combination of R hip and knee flexion supine in bed RLE Sensation:  (denied numbness/tingling) LLE Deficits / Details: Gross weakness but fairly symmetrical bil with MMT supine in bed, grossly 4 to 4+; WFL AROM LLE Sensation:  (denied numbness/tingling)    Cervical / Trunk Assessment Cervical / Trunk Assessment: Kyphotic;Other exceptions (  R inferior scapula fluid pocket noted / rn called to room to look at fluid mas)  Communication   Communication: No difficulties  Cognition Arousal/Alertness: Awake/alert Behavior During Therapy: Flat affect Overall Cognitive  Status: Impaired/Different from baseline Area of Impairment: Orientation, Attention, Following commands, Safety/judgement, Awareness, Problem solving, Memory                 Orientation Level: Disoriented to, Time, Situation Current Attention Level: Sustained Memory: Decreased short-term memory Following Commands: Follows one step commands consistently, Follows one step commands with increased time Safety/Judgement: Decreased awareness of safety, Decreased awareness of deficits Awareness: Emergent Problem Solving: Slow processing, Decreased initiation, Difficulty sequencing, Requires verbal cues, Requires tactile cues General Comments: Read calendar start of session but thought this weekend was Valentine's day, indicating memory deficits. Pt unable to identify situation. Increased time to process and initiate simple tasks when cued, needing repetition of cues at times.        General Comments General comments (skin integrity, edema, etc.): BP Map (110) consistently RN made aware, HR 80s entire session RA pt with edema mass at R superior scapula / neck area and shoulder pain    Exercises     Assessment/Plan    PT Assessment Patient needs continued PT services  PT Problem List Decreased strength;Decreased activity tolerance;Decreased range of motion;Decreased balance;Decreased mobility;Decreased cognition;Decreased knowledge of use of DME;Pain       PT Treatment Interventions DME instruction;Stair training;Gait training;Functional mobility training;Therapeutic activities;Therapeutic exercise;Balance training;Neuromuscular re-education;Patient/family education;Cognitive remediation    PT Goals (Current goals can be found in the Care Plan section)  Acute Rehab PT Goals Patient Stated Goal: to get better PT Goal Formulation: With patient Time For Goal Achievement: 07/11/21 Potential to Achieve Goals: Good    Frequency Min 2X/week     Co-evaluation PT/OT/SLP  Co-Evaluation/Treatment: Yes Reason for Co-Treatment: For patient/therapist safety;To address functional/ADL transfers;Necessary to address cognition/behavior during functional activity;Complexity of the patient's impairments (multi-system involvement) PT goals addressed during session: Mobility/safety with mobility;Balance OT goals addressed during session: ADL's and self-care;Proper use of Adaptive equipment and DME       AM-PAC PT "6 Clicks" Mobility  Outcome Measure Help needed turning from your back to your side while in a flat bed without using bedrails?: A Little Help needed moving from lying on your back to sitting on the side of a flat bed without using bedrails?: Total Help needed moving to and from a bed to a chair (including a wheelchair)?: Total Help needed standing up from a chair using your arms (e.g., wheelchair or bedside chair)?: Total Help needed to walk in hospital room?: Total Help needed climbing 3-5 steps with a railing? : Total 6 Click Score: 8    End of Session Equipment Utilized During Treatment: Gait belt Activity Tolerance: Patient tolerated treatment well Patient left: in chair;with call bell/phone within reach;with chair alarm set Nurse Communication: Mobility status;Other (comment) (edema R neck/shoulder) PT Visit Diagnosis: Unsteadiness on feet (R26.81);Other abnormalities of gait and mobility (R26.89);Muscle weakness (generalized) (M62.81);History of falling (Z91.81);Difficulty in walking, not elsewhere classified (R26.2);Pain Pain - Right/Left: Right Pain - part of body: Leg;Shoulder (back)    Time: 7829-5621 PT Time Calculation (min) (ACUTE ONLY): 37 min   Charges:   PT Evaluation $PT Eval Moderate Complexity: 1 Mod          Raymond Gurney, PT, DPT Acute Rehabilitation Services  Pager: [phone] Office: [phone]   Jewel Baize 06/27/2021, 12:44 PM

## 2021-06-27 NOTE — Progress Notes (Signed)
Referring Physician(s): Marlin Canary, DO  Supervising Physician: Malachy Moan  Patient Status:  [facility] - In-pt  Chief Complaint:  1. Left percutaneous nephrostomy 2. Right psoas abscess drain placed by Dr. Bryn Gulling 06/26/21  Subjective:  Pt sitting upright in bed drinking juice. Pt reports he does not have much of an appetite.   Allergies: Patient has no known allergies.  Medications: Prior to Admission medications   Medication Sig Start Date End Date Taking? Authorizing Provider  Albuterol Sulfate, sensor, (PROAIR DIGIHALER) 108 (90 Base) MCG/ACT AEPB Inhale 1 puff into the lungs daily as needed (shortness of breath).   Yes [provider]  brimonidine (ALPHAGAN) 0.2 % ophthalmic solution SMARTSIG:In Eye(s) 06/22/21  Yes [provider]  dorzolamide-timolol (COSOPT) 22.3-6.8 MG/ML ophthalmic solution Place 1 drop into the left eye 2 (two) times daily. 05/05/21  Yes [provider]  ibuprofen (ADVIL) 600 MG tablet Take 600 mg by mouth every 6 (six) hours as needed. 06/19/21  Yes [provider]  Multiple Vitamin (MULTIVITAMIN) tablet Take 1 tablet by mouth daily.   Yes [provider]  Phenyleph-Doxylamine-DM-APAP (NYQUIL SEVERE COLD/FLU PO) Take 1 Dose by mouth at bedtime as needed (congestion).   Yes [provider]  RHOPRESSA 0.02 % SOLN Place 1 drop into the left eye at bedtime. 05/05/21  Yes [provider]  tamsulosin (FLOMAX) 0.4 MG CAPS capsule Take 0.4 mg by mouth at bedtime. 04/17/21  Yes [provider]  tiotropium (SPIRIVA) 18 MCG inhalation capsule Place 18 mcg into inhaler and inhale daily.   Yes [provider]  VYZULTA 0.024 % SOLN Place 1 drop into the left eye at bedtime. 05/05/21  Yes [provider]  pantoprazole (PROTONIX) 40 MG tablet TAKE 1 TABLET (40 MG TOTAL) BY MOUTH 2 (TWO) TIMES DAILY BEFORE A MEAL. Patient not taking: Reported on 06/25/2021 08/04/18   Malissa Hippo, MD   polyethylene glycol-electrolytes (TRILYTE) 420 g solution Take 4,000 mLs by mouth as directed. Patient not taking: Reported on 06/25/2021 08/29/20   Marguerita Merles, Reuel Boom, MD     Vital Signs: BP (!) 149/85 (BP Location: Left Arm)   Pulse 85   Temp 98 F (36.7 C) (Oral)   Resp 18   Ht 6' (1.829 m)   Wt 132 lb 7.9 oz (60.1 kg)   SpO2 96%   BMI 17.97 kg/m   Physical Exam Constitutional:      Appearance: He is ill-appearing.  HENT:     Head: Normocephalic and atraumatic.  Eyes:     Extraocular Movements: Extraocular movements intact.     Pupils: Pupils are equal, round, and reactive to light.  Pulmonary:     Effort: Pulmonary effort is normal. No respiratory distress.  Abdominal:     Comments: R hip drain intact. ~25cc serosanguinous OP in JP drain. Dressing C/D/I with sutures intact. Flushes easily.   L nephrostomy drain intact. ~ 50cc red-colored urine in gravity bag. Site C/D/I, sutures in place.   Skin:    General: Skin is warm and dry.  Neurological:     Mental Status: He is alert and oriented to person, place, and time.  Psychiatric:        Mood and Affect: Mood normal.        Behavior: Behavior normal.        Thought Content: Thought content normal.        Judgment: Judgment normal.    Imaging: CT ABDOMEN PELVIS WO CONTRAST  Result Date:  06/25/2021 CLINICAL DATA:  Abdominal pain.  Acute.  Nonlocalized. EXAM: CT ABDOMEN AND PELVIS WITHOUT CONTRAST TECHNIQUE: Multidetector CT imaging of the abdomen and pelvis was performed following the standard protocol without IV contrast. RADIATION DOSE REDUCTION: This exam was performed according to the departmental dose-optimization program which includes automated exposure control, adjustment of the mA and/or kV according to patient size and/or use of iterative reconstruction technique. COMPARISON:  None. FINDINGS: Lower chest: Lung bases are clear. Hepatobiliary: Benign cyst in the LEFT hepatic lobe. No biliary duct dilatation.  Gallbladder normal. Pancreas: Pancreas is normal. No ductal dilatation. No pancreatic inflammation. Spleen: Normal spleen Adrenals/urinary tract: Adrenal glands normal. Hydronephrosis and hydroureter the LEFT collecting system. Ureter is difficult to follow as there is little intra-abdominal fat however, there is potentially a obstructing calculus at the LEFT ureteral junction measuring 5 mm (image 73/2). This is adjacent to a large posterior diverticulum measuring 4.5 cm which collects several calcifications. Several additional calculi within the LEFT kidney. Multiple small calculi within the RIGHT kidney. No RIGHT hydroureter. Along the posterior RIGHT wall the bladder there is a focus of nodularity measuring 7 mm (image 72/2) there is significant volume of gas within the bladder. Stomach/Bowel: Stomach, small bowel, appendix, and cecum are normal. The colon and rectosigmoid colon are normal. Vascular/Lymphatic: Abdominal aorta is normal caliber. No periportal or retroperitoneal adenopathy. No pelvic adenopathy. Reproductive: Unremarkable Other: No intraperitoneal free for Musculoskeletal: There is expansion of the RIGHT psoas muscle to 4.9 x 5.6 cm (image 54/2). there is gas centrally within the expanded muscle. IMPRESSION: 1. Expanded RIGHT psoas muscle with central gas consistent with a PSOAS ABSCESS versus psoas hematoma with superimposed infection. 2. LEFT hydronephrosis and hydroureter appears to be secondary to calculus obstruction at the LEFT vesicoureteral junction. Calculus in the medial facet in the vicinity of a posterior LEFT bladder diverticulum with dependent stones. 3. Large volume of gas within the bladder. 4. Nodularity along the posterior RIGHT wall of the bladder. Cannot exclude bladder neoplasm. 5. Bilateral nephrolithiasis. Electronically Signed   By: Genevive Bi M.D.   On: 06/25/2021 15:33   DG Shoulder Right  Result Date: 06/25/2021 CLINICAL DATA:  Inability to move arm.  No trauma  history submitted. EXAM: RIGHT SHOULDER - 2+ VIEW COMPARISON:  CT chest 05/29/2021 from [facility] [place] FINDINGS: High riding humeral head. Mild degenerative changes of the undersurface of the acromion. No acute fracture or dislocation. Visualized portion of the right hemithorax is normal. IMPRESSION: High riding humeral head, consistent with chronic rotator cuff injury. No acute findings. Electronically Signed   By: Jeronimo Greaves M.D.   On: 06/25/2021 13:32   MR BRAIN WO CONTRAST  Result Date: 06/25/2021 CLINICAL DATA:  Acute cervical myelopathy. Inability to move right arm. EXAM: MRI HEAD WITHOUT CONTRAST TECHNIQUE: Multiplanar, multiecho pulse sequences of the brain and surrounding structures were obtained without intravenous contrast. COMPARISON:  None. FINDINGS: Brain: No acute infarction, hemorrhage, hydrocephalus, extra-axial collection or mass lesion. Mild white matter changes with periventricular and deep white matter hyperintensities bilaterally. Mild hyperintensity in the pons bilaterally. Vascular: Normal arterial flow voids at the skull base. Skull and upper cervical spine: No focal lesion. Cervical spondylosis and sclerosis. Sinuses/Orbits: Paranasal sinuses clear.  Negative orbit Other: None IMPRESSION: No acute abnormality. Mild to moderate white matter changes most likely chronic microvascular ischemia. Electronically Signed   By: Marlan Palau M.D.   On: 06/25/2021 15:14   MR Cervical Spine Wo Contrast  Result Date: 06/25/2021 CLINICAL DATA:  Acute cervical myelopathy. Inability to move right arm. EXAM: MRI CERVICAL SPINE WITHOUT CONTRAST TECHNIQUE: Multiplanar, multisequence MR imaging of the cervical spine was performed. No intravenous contrast was administered. COMPARISON:  CT soft tissue neck 05/06/2018 FINDINGS: Alignment: Mild anterolisthesis C2-3. Moderate cervical kyphosis at C3. Remaining alignment normal Vertebrae: Negative for fracture or mass. Cervical spondylosis and vertebral  body sclerosis from C3 through C7 as noted on the prior CT. Cord: Negative Posterior Fossa, vertebral arteries, paraspinal tissues: Negative . Disc levels: C2-3: Mild anterolisthesis. Mild degenerative change without significant stenosis C3-4: Disc degeneration and diffuse uncinate spurring. Mild spinal stenosis and mild foraminal stenosis bilaterally due to spurring. C4-5: Disc degeneration and diffuse uncinate spurring. Mild spinal stenosis. Moderate foraminal narrowing bilaterally due to spurring. C5-6: Disc degeneration and diffuse uncinate spurring. Moderate left foraminal narrowing and mild right foraminal narrowing. C6-7: Disc degeneration with diffuse uncinate spurring. Mild spinal stenosis. Moderate right foraminal narrowing and mild left foraminal narrowing C7-T1: Disc degeneration and diffuse uncinate spurring. Mild foraminal narrowing bilaterally due to spurring. Mild facet degeneration bilaterally IMPRESSION: Advanced cervical spondylosis. Multilevel degenerative changes with spurring causing spinal and foraminal stenosis throughout the cervical spine. No cord compression or cord signal abnormality. Appearance appears chronic without acute change since the prior CT in 2020 Electronically Signed   By: Marlan Palau M.D.   On: 06/25/2021 15:19   DG Chest Port 1 View  Result Date: 06/25/2021 CLINICAL DATA:  Shortness of breath EXAM: PORTABLE CHEST 1 VIEW COMPARISON:  No prior chest radiograph FINDINGS: Cardiac and mediastinal contours are within normal limits. Aortic tortuosity. No focal pulmonary opacity. No pleural effusion or pneumothorax. No acute osseous abnormality. IMPRESSION: No acute cardiopulmonary process. Electronically Signed   By: Wiliam Ke M.D.   On: 06/25/2021 13:32   IR NEPHROSTOMY PLACEMENT LEFT  Result Date: 06/26/2021 INDICATION: 71 year old gentleman with bilateral lower abdominal pain due to a combination of left hydronephrosis and right psoas abscess presents to IR for  left percutaneous nephrostomy and right psoas abscess drain. EXAM: Ultrasound and fluoroscopy guided left nephrostomy drain placement COMPARISON:  None. MEDICATIONS: Rocephin 2 g IV; The antibiotic was administered in an appropriate time frame prior to skin puncture. ANESTHESIA/SEDATION: Moderate (conscious) sedation was employed during this procedure. A total of Versed 2 mg and Fentanyl 200 mcg was administered intravenously by the radiology nurse. Total intra-service moderate Sedation Time: 41 minutes. The patient's level of consciousness and vital signs were monitored continuously by radiology nursing throughout the procedure under my direct supervision. CONTRAST:  25 mL of Omnipaque 300-administered into the collecting system(s) FLUOROSCOPY TIME:  Radiation Exposure Index (as provided by the fluoroscopic device): 21 mGy Kerma COMPLICATIONS: None immediate. PROCEDURE: Informed written consent was obtained from the patient after a thorough discussion of the procedural risks, benefits and alternatives. All questions were addressed. Maximal Sterile Barrier Technique was utilized including caps, mask, sterile gowns, sterile gloves, sterile drape, hand hygiene and skin antiseptic. A timeout was performed prior to the initiation of the procedure. Patient positioned prone on the procedure table. Left flank skin prepped and draped usual fashion. Ultrasound evaluation of the left kidney showed mild hydronephrosis. Utilizing ultrasound guidance, a mid pole calyx was accessed with a 21 gauge needle. Guidewire could not be advanced through this calyx, however the collecting system was successfully opacified through this access. Lower pole calyx was accessed with a 21 gauge needle utilizing fluoroscopic guidance. The 0.018 inch guidewire was successfully advanced through the lower pole access to the level of the  ureter. Transitional dilator set placed over 0.018 inch guidewire. Contrast administered through the transitional  dilator opacified the renal collecting system and ureter. Filling defects within the renal collecting system and ureter consistent with blood clot. Transitional dilator set exchanged for 10.2 [place] multipurpose pigtail drain over 0.035 inch guidewire. Contrast administered through the drain showed appropriate positioning of the pigtail within the renal pelvis. It was secured to skin with suture and connected to bag. IMPRESSION: 10.2 French left nephrostomy drain placed using ultrasound and fluoroscopic guidance. Electronically Signed   By: Acquanetta Belling M.D.   On: 06/26/2021 16:27   CT IMAGE GUIDED DRAINAGE BY PERCUTANEOUS CATHETER  Result Date: 06/26/2021 INDICATION: 71 year old gentleman with bilateral lower abdominal pain due to a combination of left hydronephrosis and right psoas abscess presents to IR for left percutaneous nephrostomy and right psoas abscess drain. EXAM: CT GUIDED DRAINAGE OF  ABSCESS MEDICATIONS: The patient is currently admitted to the hospital and receiving intravenous antibiotics. The antibiotics were administered within an appropriate time frame prior to the initiation of the procedure. ANESTHESIA/SEDATION: 0.5 mg IV Versed Moderate Sedation Time:  14 minutes The patient was continuously monitored during the procedure by the interventional radiology nurse under my direct supervision. COMPLICATIONS: None immediate. TECHNIQUE: Informed written consent was obtained from the patient after a thorough discussion of the procedural risks, benefits and alternatives. All questions were addressed. Maximal Sterile Barrier Technique was utilized including caps, mask, sterile gowns, sterile gloves, sterile drape, hand hygiene and skin antiseptic. A timeout was performed prior to the initiation of the procedure. PROCEDURE: Patient position left lateral decubitus on the CT table. The right anterior abdominal wall skin prepped and draped usual fashion. I was not able to identify a safe path to the most  liquified, gas containing portion of the right psoas collection, however could target a more superior region in the right psoas muscle belly which appeared inflamed. Utilizing CT guidance, this region of the right psoas muscle was access with 5 [place] Yueh catheter. 7 mL of bloody material was successfully aspirated from the collection. I was able to loop a guidewire into this region followed by placement of 10.2 [place] multipurpose pigtail drain. Drain connected to bulb suction intact to skin with suture. The sample was sent for Gram stain and culture. FINDINGS: Right psoas abscess. IMPRESSION: Successful placement of 10.2 French multipurpose pigtail drain in the superior portion of inflamed right psoas muscle utilizing CT guidance. I could not identify a safe path to the liquified, gas containing portion of the right psoas fluid collection. Electronically Signed   By: Acquanetta Belling M.D.   On: 06/26/2021 16:31    Labs:  CBC: Recent Labs    06/25/21 1317 06/26/21 0312 06/27/21 0122  WBC 55.0* 44.0* 24.0*  HGB 11.7* 9.2* 9.3*  HCT 34.1* 27.0* 27.3*  PLT 88* 43* 62*    COAGS: Recent Labs    06/26/21 0913  INR 1.3*    BMP: Recent Labs    06/25/21 1317 06/26/21 0312 06/27/21 0122  NA 136 139 138  K 3.9 3.6 3.2*  CL 102 110 112*  CO2 20* 16* 19*  GLUCOSE 106* 88 92  BUN 61* 61* 50*  CALCIUM 8.2* 7.2* 7.6*  CREATININE 2.90* 2.48* 1.69*  GFRNONAA 22* 27* 42*    LIVER FUNCTION TESTS: Recent Labs    06/25/21 1317  BILITOT 5.1*  AST 47*  ALT 24  ALKPHOS 192*  PROT 6.7  ALBUMIN 2.2*    Assessment and Plan:  1. Left percutaneous nephrostomy ~ 50cc red-colored urine in gravity bag. Site C/D/I. Sutures in place.   2. Right psoas abscess drain R hip drain intact. ~25cc serosanguinous OP in JP drain. Dressing C/D/I with sutures intact. Flushes easily.   WBC 24.0 (44.0) Afebrile. VSS   Continue to flush R psoas abscess drain with 5 cc NS TID. Record OP q shift.  Change  dressing q shift or as needed to keep clean and dry.  Contact IR if difficulty flushing or if there is a sudden change in OP.   IR to follow  Electronically Signed: Shon Hough, NP 06/27/2021, 11:04 AM   I spent a total of 15 Minutes at the the patient's bedside AND on the patient's hospital floor or unit, greater than 50% of which was counseling/coordinating care for L percutaneous nephrostomy tube and R psoas abscess drain placed 06/26/21 by Dr. Bryn Gulling.

## 2021-06-28 DIAGNOSIS — K6812 Psoas muscle abscess: Secondary | ICD-10-CM | POA: Diagnosis not present

## 2021-06-28 LAB — CULTURE, BLOOD (SINGLE): Special Requests: ADEQUATE

## 2021-06-28 LAB — BASIC METABOLIC PANEL
Anion gap: 5 (ref 5–15)
BUN: 32 mg/dL — ABNORMAL HIGH (ref 8–23)
CO2: 20 mmol/L — ABNORMAL LOW (ref 22–32)
Calcium: 7.9 mg/dL — ABNORMAL LOW (ref 8.9–10.3)
Chloride: 109 mmol/L (ref 98–111)
Creatinine, Ser: 1.3 mg/dL — ABNORMAL HIGH (ref 0.61–1.24)
GFR, Estimated: 58 mL/min — ABNORMAL LOW (ref >60)
Glucose, Bld: 100 mg/dL — ABNORMAL HIGH (ref 70–99)
Potassium: 3.9 mmol/L (ref 3.5–5.1)
Sodium: 134 mmol/L — ABNORMAL LOW (ref 135–145)

## 2021-06-28 LAB — CBC
HCT: 28.3 % — ABNORMAL LOW (ref 39.0–52.0)
Hemoglobin: 9.7 g/dL — ABNORMAL LOW (ref 13.0–17.0)
MCH: 27.6 pg (ref 26.0–34.0)
MCHC: 34.3 g/dL (ref 30.0–36.0)
MCV: 80.4 fL (ref 80.0–100.0)
Platelets: 87 10*3/uL — ABNORMAL LOW (ref 150–400)
RBC: 3.52 MIL/uL — ABNORMAL LOW (ref 4.22–5.81)
RDW: 15.3 % (ref 11.5–15.5)
WBC: 18.9 10*3/uL — ABNORMAL HIGH (ref 4.0–10.5)
nRBC: 0.1 % (ref 0.0–0.2)

## 2021-06-28 MED ORDER — SENNOSIDES-DOCUSATE SODIUM 8.6-50 MG PO TABS
1.0000 | ORAL_TABLET | Freq: Two times a day (BID) | ORAL | Status: DC | PRN
  Administered 2021-06-28: 1 via ORAL
  Filled 2021-06-28: qty 1

## 2021-06-28 NOTE — Progress Notes (Signed)
? ?PROGRESS NOTE ? ? ? ?Ian Boyd  XBJ:[id number] DOB: 02-21-1951 DOA: 06/25/2021 ?PCP: Curlene Labrum, MD ? ? ?Brief Narrative: ?Ian Boyd is a 71 y.o. male with medical history significant for Hep C, blind right eye, dysphagia.  Patient presented to the ED with complaint of nausea vomiting diarrhea of about 2 weeks duration.  He also reports lower abdominal pain, both to the left and right side of his lower abdomen.  He presented to [facility], he was given a shot of an injection, and subsequently he declined even further, he could not walk, and then he also could not move his right arm.  Patient has had problems with his right shoulder for at least 3 years, but this significantly worsened over the past few days.  Found to have ecoli bacteremia with a psoas abscess.  Currently on Ceftriaxone. PT/OT recommendations for rehab when medically stable for discharge. ? ? ?Assessment and Plan: ?* Psoas abscess, right ([place]) ?CT findings concerning for right psoas abscess vs hematoma. Drain placed by IR on 4/6 with preliminary culture significant for gram negative rods ?-Continue drain per IR ?-Continue Ceftriaxone ?-wound culture- ecoli ? ?AKI (acute kidney injury) ([place]) ?Creatinine of 2.9 on admission.  Improving after nephrostomy tube placement.  ? ?Hydronephrosis of left kidney ?Hydronephrosis of the left kidney, with hydroureter, secondary to calculus obstruction at the left vesicoureteral junction.  Also with  urinary tract infection. Evaluated by urologist Dr. Alyson Ingles with recommendation for left nephrostomy tube placement which was performed on 4/6 by IR ? ?Severe sepsis (HCC)-resolved as of 06/27/2021 ?Secondary to bacteremia, psoas abscess and UTI. Physiology resolved. ? ?Rotator cuff tear arthropathy ?X-ray suggesting chronic rotator cuff injury.  Reports right shoulder problems for about 3 years now although now reports new right shoulder mass. MRI brain negative. ?-OT ?-MRI confirms known  rotator cuff tear-- doubt any intervention would be considered until his infection has cleared ? ?Urinary tract infection ?Symptomatic with dysuria, UA with positive leukocytes, with severe sepsis. Complicated by obstructive ureteral calculus. Urine culture with multiple species. ?-Continue Ceftriaxone for bacteremia and abscess ? ?Cocaine use ?Counseled on cessation. ? ?Hypokalemia ?-replete ? ?Thrombocytopenia ([place]) ?Unsure if acute or chronic. It is possible this is acute in setting of severe sepsis. Platelets of 88,000 on admission. Low of 43,000 and now rising. Some blood from nephrostomy tube but hemoglobin is currently stable. ?-CBC daily ? ?E coli bacteremia ?Presumed urinary source, although urine culture unhelpful. Leukocytosis improving. ?-Continue Ceftriaxone ?-ecoli- pan sensitive ? ? ?DVT prophylaxis: SCDs ?Code Status:   Code Status: Full Code ?Family Communication: Sister on telephone ?Disposition Plan: Discharge to CIR Medically stable for discharge likely in 1-2 days pending culture data, transition to oral antibiotics, IR recommendations ? ? ?Consultants:  ?Interventional radiology ?Urology ? ?Procedures:  ?Left percutaneous nephrostomy ?Right psoas abscess drain ? ?Antimicrobials: ?Ceftriaxone  ? ? ?Subjective: ?Does not have dentures in ? ?Objective: ?BP (!) 150/77 (BP Location: Left Arm)   Pulse 87   Temp 98.2 ?F (36.8 ?C) (Oral)   Resp 18   Ht 6' (1.829 m)   Wt 60.1 kg   SpO2 98%   BMI 17.97 kg/m?  ? ?Examination: ? ? ?General: Appearance:    Frail male in no acute distress  ?   ?Lungs:     respirations unlabored  ?Heart:    Normal heart rate  ?  ?MS:   All extremities are intact.  ?  ?Neurologic:   Awake, alert  ?  ? ? ?  Data Reviewed: I have personally reviewed following labs and imaging studies ? ?CBC ?Lab Results  ?Component Value Date  ? WBC 18.9 (H) 06/28/2021  ? RBC 3.52 (L) 06/28/2021  ? HGB 9.7 (L) 06/28/2021  ? HCT 28.3 (L) 06/28/2021  ? MCV 80.4 06/28/2021  ? MCH 27.6  06/28/2021  ? PLT 87 (L) 06/28/2021  ? MCHC 34.3 06/28/2021  ? RDW 15.3 06/28/2021  ? LYMPHSABS 1.1 06/25/2021  ? MONOABS 1.1 (H) 06/25/2021  ? EOSABS 0.0 06/25/2021  ? BASOSABS 0.0 06/25/2021  ? ? ? ?Last metabolic panel ?Lab Results  ?Component Value Date  ? NA 134 (L) 06/28/2021  ? K 3.9 06/28/2021  ? CL 109 06/28/2021  ? CO2 20 (L) 06/28/2021  ? BUN 32 (H) 06/28/2021  ? CREATININE 1.30 (H) 06/28/2021  ? GLUCOSE 100 (H) 06/28/2021  ? GFRNONAA 58 (L) 06/28/2021  ? CALCIUM 7.9 (L) 06/28/2021  ? PROT 6.7 06/25/2021  ? ALBUMIN 2.2 (L) 06/25/2021  ? BILITOT 5.1 (H) 06/25/2021  ? ALKPHOS 192 (H) 06/25/2021  ? AST 47 (H) 06/25/2021  ? ALT 24 06/25/2021  ? ANIONGAP 5 06/28/2021  ? ? ?GFR: ?Estimated Creatinine Clearance: 43 mL/min (A) (by C-G formula based on SCr of 1.3 mg/dL (H)). ? ?Recent Results (from the past 240 hour(s))  ?Culture, blood (single)     Status: Abnormal  ? Collection Time: 06/25/21  2:15 PM  ? Specimen: Left Antecubital; Blood  ?Result Value Ref Range Status  ? Specimen Description   Final  ?  LEFT ANTECUBITAL ?Performed at [facility], [address], [place], [place] [address] ?  ? Special Requests   Final  ?  BOTTLES DRAWN AEROBIC AND ANAEROBIC Blood Culture adequate volume ?Performed at [facility], [address] [place], [place] [address] ?  ? Culture  Setup Time   Final  ?  GRAM NEGATIVE RODS ?IN BOTH AEROBIC AND ANAEROBIC BOTTLES ?Gram Stain Report Called to,Read Back By and Verified With: Yonah [id number] BY THOMPSON S. ?CRITICAL RESULT CALLED TO, READ BACK BY AND VERIFIED WITH: PHARMD MASON WISE ON 06/26/21 @ 10:07 BY DRT ?Performed at [facility], [place] [address], [place], [place] [address] ?  ? Culture ESCHERICHIA COLI (A)  Final  ? Report Status 06/28/2021 FINAL  Final  ? Organism ID, Bacteria ESCHERICHIA COLI  Final  ?    Susceptibility  ? Escherichia coli - MIC*  ?  AMPICILLIN <=2 SENSITIVE Sensitive   ?  CEFAZOLIN <=4 SENSITIVE Sensitive   ?  CEFEPIME <=0.12 SENSITIVE  Sensitive   ?  CEFTAZIDIME <=1 SENSITIVE Sensitive   ?  CEFTRIAXONE <=0.25 SENSITIVE Sensitive   ?  CIPROFLOXACIN <=0.25 SENSITIVE Sensitive   ?  GENTAMICIN <=1 SENSITIVE Sensitive   ?  IMIPENEM <=0.25 SENSITIVE Sensitive   ?  TRIMETH/SULFA <=20 SENSITIVE Sensitive   ?  AMPICILLIN/SULBACTAM <=2 SENSITIVE Sensitive   ?  PIP/TAZO <=4 SENSITIVE Sensitive   ?  * ESCHERICHIA COLI  ?Blood Culture ID Panel (Reflexed)     Status: Abnormal  ? Collection Time: 06/25/21  2:15 PM  ?Result Value Ref Range Status  ? Enterococcus faecalis NOT DETECTED NOT DETECTED Final  ? Enterococcus Faecium NOT DETECTED NOT DETECTED Final  ? Listeria monocytogenes NOT DETECTED NOT DETECTED Final  ? Staphylococcus species NOT DETECTED NOT DETECTED Final  ? Staphylococcus aureus (BCID) NOT DETECTED NOT DETECTED Final  ? Staphylococcus epidermidis NOT DETECTED NOT DETECTED Final  ? Staphylococcus lugdunensis NOT DETECTED  NOT DETECTED Final  ? Streptococcus species NOT DETECTED NOT DETECTED Final  ? Streptococcus agalactiae NOT DETECTED NOT DETECTED Final  ? Streptococcus pneumoniae NOT DETECTED NOT DETECTED Final  ? Streptococcus pyogenes NOT DETECTED NOT DETECTED Final  ? A.calcoaceticus-baumannii NOT DETECTED NOT DETECTED Final  ? Bacteroides fragilis NOT DETECTED NOT DETECTED Final  ? Enterobacterales DETECTED (A) NOT DETECTED Final  ?  Comment: Enterobacterales represent a large order of gram negative bacteria, not a single organism. ?CRITICAL RESULT CALLED TO, READ BACK BY AND VERIFIED WITH: ?PHARMD MASON WISE ON 06/26/21 @ 10:07 BY DRT ?  ? Enterobacter cloacae complex NOT DETECTED NOT DETECTED Final  ? Escherichia coli DETECTED (A) NOT DETECTED Final  ?  Comment: CRITICAL RESULT CALLED TO, READ BACK BY AND VERIFIED WITH: ?PHARMD MASON WISE ON 06/26/21 @ 10:07 BY DRT ?  ? Klebsiella aerogenes NOT DETECTED NOT DETECTED Final  ? Klebsiella oxytoca NOT DETECTED NOT DETECTED Final  ? Klebsiella pneumoniae NOT DETECTED NOT DETECTED Final  ? Proteus  species NOT DETECTED NOT DETECTED Final  ? Salmonella species NOT DETECTED NOT DETECTED Final  ? Serratia marcescens NOT DETECTED NOT DETECTED Final  ? Haemophilus influenzae NOT DETECTED NOT DETECTED Final  ? Sherlie Ban

## 2021-06-28 NOTE — Progress Notes (Signed)
Occupational Therapy Treatment ?Patient Details ?Name: Ian Boyd ?MRN: [id number] ?DOB: [birth year] ?Today's Date: 06/28/2021 ? ? ?History of present illness 71 yo male admitted with N/V/D inability to move R arm, difficulty walking and expressive deficts. 4/5 admit due to sepsis CT abdomen pelvis shows probable psoas abscess v/s psoas hematoma with superimposed infection (+) crack cocaine use 4/6 Left percutaneous nephrostomy 2. Right psoas abscess drain. MRI of brain with no acute intracranial abnormality. PMH blind R eye , drug use, smoker ?  ?OT comments ? Pt making progress with functional goals. Pt required encouragement to work with OT, but agreeable. Pt required mod - max A to sit EOB, min A for basic grooming/hygiene tasks, mod A with simulated UB bathing and mod A to don clean gown; mod A SPT. OT will continue to follow acutely to maximize level of function and safety  ? ?Recommendations for follow up therapy are one component of a multi-disciplinary discharge planning process, led by the attending physician.  Recommendations may be updated based on patient status, additional functional criteria and insurance authorization. ?   ?Follow Up Recommendations ? Skilled nursing-short term rehab (<3 hours/day) vs Acute Inpatient Rehab pending available home support ?  ?Assistance Recommended at Discharge Intermittent Supervision/Assistance  ?Patient can return home with the following ? Two people to help with walking and/or transfers;Two people to help with bathing/dressing/bathroom;Assistance with cooking/housework;Assistance with feeding;Direct supervision/assist for medications management;Direct supervision/assist for financial management;Assist for transportation;Help with stairs or ramp for entrance ?  ?Equipment Recommendations ? Wheelchair (measurements OT);Wheelchair cushion (measurements OT);Hospital bed  ?  ?Recommendations for Other Services   ? ?  ?Precautions / Restrictions  Precautions ?Precautions: Fall ?Precaution Comments: R groin JP drain; L abdomen pleural drain; mass R neck/shoulder region ?Restrictions ?Weight Bearing Restrictions: No  ? ? ?  ? ?Mobility Bed Mobility ?Overal bed mobility: Needs Assistance ?Bed Mobility: Supine to Sit, Sit to Supine ?  ?  ?Supine to sit: Mod assist, Max assist ?Sit to supine: Max assist ?  ?General bed mobility comments: mod A to initially bring LEs to EOB, max A off EOB, max A to elevate trunk ad to scoot hips to EOB ?  ? ?Transfers ?Overall transfer level: Needs assistance ?Equipment used: 1 person hand held assist ?Transfers: Sit to/from Stand, Bed to chair/wheelchair/BSC ?Sit to Stand: Mod assist ?  ?  ?Step pivot transfers: Mod assist ?  ?  ?  ?  ?  ?Balance Overall balance assessment: Needs assistance ?Sitting-balance support: Single extremity supported, Bilateral upper extremity supported, Feet supported ?Sitting balance-Leahy Scale: Fair ?Sitting balance - Comments: min guard-min A for balance/support ?  ?Standing balance support: Bilateral upper extremity supported, During functional activity ?Standing balance-Leahy Scale: Poor ?  ?  ?  ?  ?  ?  ?  ?  ?  ?  ?  ?  ?   ? ?ADL either performed or assessed with clinical judgement  ? ?ADL Overall ADL's : Needs assistance/impaired ?  ?  ?Grooming: Wash/dry hands;Wash/dry face;Minimal assistance ?  ?  ?  ?  ?  ?Upper Body Dressing : Moderate assistance;Sitting ?  ?  ?  ?Toilet Transfer: Moderate assistance;Cueing for safety;Cueing for sequencing;Stand-pivot ?  ?  ?  ?  ?  ?Functional mobility during ADLs: Moderate assistance;Cueing for safety;Cueing for sequencing ?  ?  ? ?Extremity/Trunk Assessment Upper Extremity Assessment ?Upper Extremity Assessment: RUE deficits/detail ?RUE Deficits / Details: AROM impaired, weakness., edema in R hand ?  ?  ?  ?  Cervical / Trunk Assessment ?Cervical / Trunk Assessment: Kyphotic ?  ? ?Vision Baseline Vision/History: 1 Wears glasses ?Patient Visual Report: No  change from baseline ?  ?  ?Perception   ?  ?Praxis   ?  ? ?Cognition Arousal/Alertness: Awake/alert ?Behavior During Therapy: Flat affect ?Overall Cognitive Status: Impaired/Different from baseline ?Area of Impairment: Memory, Awareness, Problem solving ?  ?  ?  ?  ?  ?  ?  ?  ?Orientation Level: Disoriented to, Time, Situation ?  ?Memory: Decreased short-term memory ?Following Commands: Follows one step commands with increased time ?Safety/Judgement: Decreased awareness of safety, Decreased awareness of deficits ?  ?Problem Solving: Slow processing, Decreased initiation, Difficulty sequencing, Requires verbal cues, Requires tactile cues ?  ?  ?  ?   ?Exercises   ? ?  ?Shoulder Instructions   ? ? ?  ?General Comments    ? ? ?Pertinent Vitals/ Pain       Pain Assessment ?Pain Assessment: Faces ?Faces Pain Scale: Hurts little more ?Pain Location: R shoulder, R leg, back ?Pain Descriptors / Indicators: Discomfort, Grimacing, Guarding ?Pain Intervention(s): Monitored during session, Limited activity within patient's tolerance, Repositioned ? ?Home Living   ?  ?Available Help at Discharge: Family ?Type of Home: House ?  ?  ?  ?  ?  ?  ?  ?  ?  ?  ?  ?  ?  ?  ? Lives With: Son ? ?  ?Prior Functioning/Environment    ?  ?  ?  ?   ? ?Frequency ? Min 2X/week  ? ? ? ? ?  ?Progress Toward Goals ? ?OT Goals(current goals can now be found in the care plan section) ? Progress towards OT goals: Progressing toward goals ? ?   ?Plan Discharge plan remains appropriate;Frequency remains appropriate   ? ?Co-evaluation ? ? ?   ?  ?  ?  ?  ? ?  ?AM-PAC OT "6 Clicks" Daily Activity     ?Outcome Measure ? ? Help from another person eating meals?: A Little ?Help from another person taking care of personal grooming?: A Little ?Help from another person toileting, which includes using toliet, bedpan, or urinal?: A Lot ?Help from another person bathing (including washing, rinsing, drying)?: A Lot ?Help from another person to put on and taking  off regular upper body clothing?: A Lot ?Help from another person to put on and taking off regular lower body clothing?: A Lot ?6 Click Score: 14 ? ?  ?End of Session Equipment Utilized During Treatment: Gait belt ? ?OT Visit Diagnosis: Unsteadiness on feet (R26.81);Muscle weakness (generalized) (M62.81) ?  ?Activity Tolerance Patient limited by fatigue;Patient limited by pain ?  ?Patient Left with call bell/phone within reach;in bed;with bed alarm set ?  ?Nurse Communication   ?  ? ?   ? ?Time: 7824-2353 ?OT Time Calculation (min): 19 min ? ?Charges: OT General Charges ?$OT Visit: 1 Visit ?OT Treatments ?$Self Care/Home Management : 8-22 mins ? ? ?Britt Bottom ?06/28/2021, 12:41 PM ?

## 2021-06-28 NOTE — Evaluation (Signed)
Speech Language Pathology Evaluation ?Patient Details ?Name: Ian Boyd ?MRN: [id number] ?DOB: [birth year] ?Today's Date: 06/28/2021 ?Time: 6295-2841 ?SLP Time Calculation (min) (ACUTE ONLY): 17 min ? ?Problem List:  ?Patient Active Problem List  ? Diagnosis Date Noted  ? Thrombocytopenia ([place]) 06/27/2021  ? Hypokalemia 06/27/2021  ? E coli bacteremia 06/26/2021  ? Psoas abscess, right ([place]) 06/25/2021  ? Hydronephrosis of left kidney 06/25/2021  ? Urinary tract infection 06/25/2021  ? Cocaine use 06/25/2021  ? Rotator cuff tear arthropathy 06/25/2021  ? Nephrolithiasis 06/25/2021  ? AKI (acute kidney injury) ([place]) 06/25/2021  ? Esophageal dysphagia 05/04/2018  ? Dysphagia 05/02/2018  ? Hepatitis C 05/02/2018  ? ?Past Medical History:  ?Past Medical History:  ?Diagnosis Date  ? Acid reflux   ? Blind right eye   ? Drug abuse ([place])   ? ?Past Surgical History:  ?Past Surgical History:  ?Procedure Laterality Date  ? CATARACT EXTRACTION Left   ? ESOPHAGEAL DILATION N/A 05/12/2018  ? Procedure: ESOPHAGEAL DILATION;  Surgeon: Rogene Houston, MD;  Location[facility];  Service: Endoscopy;  Laterality: N/A;  ? ESOPHAGOGASTRODUODENOSCOPY N/A 05/12/2018  ? Procedure: ESOPHAGOGASTRODUODENOSCOPY (EGD);  Surgeon: Rogene Houston, MD;  Location: [facility];  Service: Endoscopy;  Laterality: N/A;  2:45  ? IR NEPHROSTOMY PLACEMENT LEFT  06/26/2021  ? ?HPI:  ?71 yo male admitted with N/V/D inability to move R arm, difficulty walking and expressive deficts. 4/5 admit due to sepsis CT abdomen pelvis shows probable psoas abscess v/s psoas hematoma with superimposed infection (+) crack cocaine use 4/6 Left percutaneous nephrostomy 2. Right psoas abscess drain. MRI of brain with no acute intracranial abnormality. PMH blind R eye , drug use, smoker  ? ?Assessment / Plan / Recommendation ?Clinical Impression ? Pt presents with deficits in attention, memory, and problem-solving.  He was willing to participate in assessment using the  SLUMS cognitive exam; however, he was generally apathetic.  He scored a 20/30, indicating cognitive deficit, with particular difficulty attending to and storing/retrieving new information.  Verbal problem-solving revealed poor divergent thinking and apathy. Uncertain of baseline function - MRI shows no acute event. Chart review indicates family concern for dementia.  He may benefit from a formal assessment for MD diagnosis to rule-in or rule-out dementia.  SLP will follow while on acute care. ?   ?SLP Assessment ? SLP Recommendation/Assessment: Patient needs continued [place] [facility] Services  ?  ?Recommendations for follow up therapy are one component of a multi-disciplinary discharge planning process, led by the attending physician.  Recommendations may be updated based on patient status, additional functional criteria and insurance authorization. ?   ?Follow Up Recommendations ? Acute inpatient rehab (3hours/day)  ?  ?Assistance Recommended at Discharge ? Frequent or constant Supervision/Assistance  ?Functional Status Assessment Patient has had a recent decline in their functional status and demonstrates the ability to make significant improvements in function in a reasonable and predictable amount of time.  ?Frequency and Duration min 2x/week  ?2 weeks ?  ?   ?SLP Evaluation ?Cognition ? Overall Cognitive Status: Impaired/Different from baseline ?Arousal/Alertness: Awake/alert ?Orientation Level: Oriented to person;Disoriented to time;Oriented to place ?Attention: Sustained;Selective ?Sustained Attention: Appears intact ?Selective Attention: Impaired ?Selective Attention Impairment: Verbal basic ?Memory: Impaired ?Memory Impairment: Storage deficit ?Problem Solving: Impaired ?Problem Solving Impairment: Verbal basic ?Safety/Judgment: Impaired  ?  ?   ?Comprehension ? Auditory Comprehension ?Overall Auditory Comprehension: Appears within functional limits for tasks assessed ?Yes/No Questions: Within  Functional Limits ?Conversation: Simple ?Reading Comprehension ?  Reading Status: Not tested  ?  ?Expression Expression ?Primary Mode of Expression: Verbal ?Verbal Expression ?Overall Verbal Expression: Appears within functional limits for tasks assessed ?Written Expression ?Dominant Hand: Right   ?Oral / Motor ? Oral Motor/Sensory Function ?Overall Oral Motor/Sensory Function: Within functional limits ?Motor Speech ?Overall Motor Speech: Appears within functional limits for tasks assessed   ?        ? ?Ian Boyd ?06/28/2021, 10:32 AM ?[facility]. Erikson Danzy, MA CCC/SLP ?Acute Rehabilitation Services ?Office number [phone] ?Pager [phone] ? ?

## 2021-06-29 DIAGNOSIS — K6812 Psoas muscle abscess: Secondary | ICD-10-CM | POA: Diagnosis not present

## 2021-06-29 MED ORDER — CEFAZOLIN SODIUM-DEXTROSE 2-4 GM/100ML-% IV SOLN
2.0000 g | Freq: Three times a day (TID) | INTRAVENOUS | Status: DC
  Administered 2021-06-29 – 2021-07-03 (×12): 2 g via INTRAVENOUS
  Filled 2021-06-29 (×12): qty 100

## 2021-06-29 NOTE — Progress Notes (Signed)
? ?PROGRESS NOTE ? ? ? ?Ian Boyd  [name]:[id number] DOB: Nov 03, 1950 DOA: 06/25/2021 ?PCP: Curlene Labrum, MD ? ? ?Brief Narrative: ?Ian Boyd is a 71 y.o. male with medical history significant for Hep C, blind right eye, dysphagia.  Patient presented to the ED with complaint of nausea vomiting diarrhea of about 2 weeks duration.  He also reports lower abdominal pain, both to the left and right side of his lower abdomen.  He presented to [facility], he was given a shot of an injection, and subsequently he declined even further, he could not walk, and then he also could not move his right arm.  Patient has had problems with his right shoulder for at least 3 years, but this significantly worsened over the past few days.  Found to have ecoli bacteremia with a psoas abscess.  PT/OT recommendations for rehab when medically stable for discharge. ? ? ?Assessment and Plan: ?* Psoas abscess, right ([place]) ?CT findings concerning for right psoas abscess vs hematoma. Drain placed by IR on 4/6 with preliminary culture significant for gram negative rods ?-Continue drain per IR ?-Continue abx-- will discuss with ID length of treatment  ?-wound culture- ecoli ? ?AKI (acute kidney injury) ([place]) ?Creatinine of 2.9 on admission.  Improving after nephrostomy tube placement.  ? ?Hydronephrosis of left kidney ?Hydronephrosis of the left kidney, with hydroureter, secondary to calculus obstruction at the left vesicoureteral junction.  Also with  urinary tract infection. Evaluated by urologist Dr. Alyson Ingles with recommendation for left nephrostomy tube placement which was performed on 4/6 by IR ? ?Severe sepsis (HCC)-resolved as of 06/27/2021 ?Secondary to bacteremia, psoas abscess and UTI. Physiology resolved. ? ?Rotator cuff tear arthropathy ?X-ray suggesting chronic rotator cuff injury.  Reports right shoulder problems for about 3 years now although now reports new right shoulder mass. MRI brain negative. ?-OT ?-MRI  confirms known rotator cuff tear-- doubt any intervention would be considered until his infection has cleared ? ?Urinary tract infection ?Symptomatic with dysuria, UA with positive leukocytes, with severe sepsis. Complicated by obstructive ureteral calculus. Urine culture with multiple species. ?-Continue abx for bacteremia and abscess ? ?Cocaine use ?Counseled on cessation. ? ?Hypokalemia ?-replete ? ?Thrombocytopenia ([place]) ?Unsure if acute or chronic. It is possible this is acute in setting of severe sepsis. Platelets of 88,000 on admission. Low of 43,000 and now rising. Some blood from nephrostomy tube but hemoglobin is currently stable. ?-CBC daily ? ?E coli bacteremia ?Presumed urinary source, although urine culture unhelpful. Leukocytosis improving. ?-Continue abx ?-ecoli- pan sensitive ? ? ?DVT prophylaxis: SCDs ?Code Status:   Code Status: Full Code ?Family Communication: Sister on telephone ?Disposition Plan: Discharge to CIR Medically stable for discharge likely in 1-2 days pending culture data, transition to oral antibiotics, IR recommendations ? ? ?Consultants:  ?Interventional radiology ?Urology ? ?Procedures:  ?Left percutaneous nephrostomy ?Right psoas abscess drain ? ?Antimicrobials: ?Ceftriaxone  ? ? ?Subjective: ?Does not have dentures in ? ?Objective: ?BP (!) 158/96   Pulse 91   Temp 98.3 ?F (36.8 ?C)   Resp 17   Ht 6' (1.829 m)   Wt 57.3 kg   SpO2 97%   BMI 17.13 kg/m?  ? ?Examination: ? ? ?General: Appearance:    Frail male in no acute distress  ?   ?Lungs:     respirations unlabored  ?Heart:    Normal heart rate  ?  ?MS:   All extremities are intact.  ?  ?Neurologic:   Awake, alert  ?  ? ? ?  Data Reviewed: I have personally reviewed following labs and imaging studies ? ?CBC ?Lab Results  ?Component Value Date  ? WBC 18.9 (H) 06/28/2021  ? RBC 3.52 (L) 06/28/2021  ? HGB 9.7 (L) 06/28/2021  ? HCT 28.3 (L) 06/28/2021  ? MCV 80.4 06/28/2021  ? MCH 27.6 06/28/2021  ? PLT 87 (L) 06/28/2021  ?  MCHC 34.3 06/28/2021  ? RDW 15.3 06/28/2021  ? LYMPHSABS 1.1 06/25/2021  ? MONOABS 1.1 (H) 06/25/2021  ? EOSABS 0.0 06/25/2021  ? BASOSABS 0.0 06/25/2021  ? ? ? ?Last metabolic panel ?Lab Results  ?Component Value Date  ? NA 134 (L) 06/28/2021  ? K 3.9 06/28/2021  ? CL 109 06/28/2021  ? CO2 20 (L) 06/28/2021  ? BUN 32 (H) 06/28/2021  ? CREATININE 1.30 (H) 06/28/2021  ? GLUCOSE 100 (H) 06/28/2021  ? GFRNONAA 58 (L) 06/28/2021  ? CALCIUM 7.9 (L) 06/28/2021  ? PROT 6.7 06/25/2021  ? ALBUMIN 2.2 (L) 06/25/2021  ? BILITOT 5.1 (H) 06/25/2021  ? ALKPHOS 192 (H) 06/25/2021  ? AST 47 (H) 06/25/2021  ? ALT 24 06/25/2021  ? ANIONGAP 5 06/28/2021  ? ? ?GFR: ?Estimated Creatinine Clearance: 41 mL/min (A) (by C-G formula based on SCr of 1.3 mg/dL (H)). ? ?Recent Results (from the past 240 hour(s))  ?Culture, blood (single)     Status: Abnormal  ? Collection Time: 06/25/21  2:15 PM  ? Specimen: Left Antecubital; Blood  ?Result Value Ref Range Status  ? Specimen Description   Final  ?  LEFT ANTECUBITAL ?Performed at [facility], [address] [place], [place] [address] ?  ? Special Requests   Final  ?  BOTTLES DRAWN AEROBIC AND ANAEROBIC Blood Culture adequate volume ?Performed at [facility], [address] [place], [place] [address] ?  ? Culture  Setup Time   Final  ?  GRAM NEGATIVE RODS ?IN BOTH AEROBIC AND ANAEROBIC BOTTLES ?Gram Stain Report Called to,Read Back By and Verified With: [place] [id number] BY THOMPSON S. ?CRITICAL RESULT CALLED TO, READ BACK BY AND VERIFIED WITH: PHARMD [place] WISE ON 06/26/21 @ 10:07 BY DRT ?Performed at [facility], [place] [address], [place], [place] [address] ?  ? Culture ESCHERICHIA COLI (A)  Final  ? Report Status 06/28/2021 FINAL  Final  ? Organism ID, Bacteria ESCHERICHIA COLI  Final  ?    Susceptibility  ? Escherichia coli - MIC*  ?  AMPICILLIN <=2 SENSITIVE Sensitive   ?  CEFAZOLIN <=4 SENSITIVE Sensitive   ?  CEFEPIME <=0.12 SENSITIVE Sensitive   ?  CEFTAZIDIME <=1 SENSITIVE  Sensitive   ?  CEFTRIAXONE <=0.25 SENSITIVE Sensitive   ?  CIPROFLOXACIN <=0.25 SENSITIVE Sensitive   ?  GENTAMICIN <=1 SENSITIVE Sensitive   ?  IMIPENEM <=0.25 SENSITIVE Sensitive   ?  TRIMETH/SULFA <=20 SENSITIVE Sensitive   ?  AMPICILLIN/SULBACTAM <=2 SENSITIVE Sensitive   ?  PIP/TAZO <=4 SENSITIVE Sensitive   ?  * ESCHERICHIA COLI  ?Blood Culture ID Panel (Reflexed)     Status: Abnormal  ? Collection Time: 06/25/21  2:15 PM  ?Result Value Ref Range Status  ? Enterococcus faecalis NOT DETECTED NOT DETECTED Final  ? Enterococcus Faecium NOT DETECTED NOT DETECTED Final  ? Listeria monocytogenes NOT DETECTED NOT DETECTED Final  ? Staphylococcus species NOT DETECTED NOT DETECTED Final  ? Staphylococcus aureus (BCID) NOT DETECTED NOT DETECTED Final  ? Staphylococcus epidermidis NOT DETECTED NOT DETECTED Final  ? Staphylococcus lugdunensis NOT DETECTED  NOT DETECTED Final  ? Streptococcus species NOT DETECTED NOT DETECTED Final  ? Streptococcus agalactiae NOT DETECTED NOT DETECTED Final  ? Streptococcus pneumoniae NOT DETECTED NOT DETECTED Final  ? Streptococcus pyogenes NOT DETECTED NOT DETECTED Final  ? A.calcoaceticus-baumannii NOT DETECTED NOT DETECTED Final  ? Bacteroides fragilis NOT DETECTED NOT DETECTED Final  ? Enterobacterales DETECTED (A) NOT DETECTED Final  ?  Comment: Enterobacterales represent a large order of gram negative bacteria, not a single organism. ?CRITICAL RESULT CALLED TO, READ BACK BY AND VERIFIED WITH: ?PHARMD [place] WISE ON 06/26/21 @ 10:07 BY DRT ?  ? Enterobacter cloacae complex NOT DETECTED NOT DETECTED Final  ? Escherichia coli DETECTED (A) NOT DETECTED Final  ?  Comment: CRITICAL RESULT CALLED TO, READ BACK BY AND VERIFIED WITH: ?PHARMD [place] WISE ON 06/26/21 @ 10:07 BY DRT ?  ? Klebsiella aerogenes NOT DETECTED NOT DETECTED Final  ? Klebsiella oxytoca NOT DETECTED NOT DETECTED Final  ? Klebsiella pneumoniae NOT DETECTED NOT DETECTED Final  ? Proteus species NOT DETECTED NOT DETECTED Final  ?  Salmonella species NOT DETECTED NOT DETECTED Final  ? Serratia marcescens NOT DETECTED NOT DETECTED Final  ? Haemophilus influenzae NOT DETECTED NOT DETECTED Final  ? Neisseria meningitidis NOT DETECTED NOT DETE

## 2021-06-29 NOTE — Plan of Care (Signed)
  Problem: Education: Goal: Knowledge of General Education information will improve Description Including pain rating scale, medication(s)/side effects and non-pharmacologic comfort measures Outcome: Progressing   

## 2021-06-30 DIAGNOSIS — B182 Chronic viral hepatitis C: Secondary | ICD-10-CM

## 2021-06-30 DIAGNOSIS — K6812 Psoas muscle abscess: Secondary | ICD-10-CM | POA: Diagnosis not present

## 2021-06-30 LAB — BASIC METABOLIC PANEL
Anion gap: 5 (ref 5–15)
BUN: 18 mg/dL (ref 8–23)
CO2: 22 mmol/L (ref 22–32)
Calcium: 8.2 mg/dL — ABNORMAL LOW (ref 8.9–10.3)
Chloride: 104 mmol/L (ref 98–111)
Creatinine, Ser: 1.09 mg/dL (ref 0.61–1.24)
GFR, Estimated: >60 mL/min (ref >60)
Glucose, Bld: 103 mg/dL — ABNORMAL HIGH (ref 70–99)
Potassium: 4.2 mmol/L (ref 3.5–5.1)
Sodium: 131 mmol/L — ABNORMAL LOW (ref 135–145)

## 2021-06-30 LAB — CBC
HCT: 25.1 % — ABNORMAL LOW (ref 39.0–52.0)
Hemoglobin: 8.2 g/dL — ABNORMAL LOW (ref 13.0–17.0)
MCH: 27.2 pg (ref 26.0–34.0)
MCHC: 32.7 g/dL (ref 30.0–36.0)
MCV: 83.1 fL (ref 80.0–100.0)
Platelets: 268 10*3/uL (ref 150–400)
RBC: 3.02 MIL/uL — ABNORMAL LOW (ref 4.22–5.81)
RDW: 15 % (ref 11.5–15.5)
WBC: 22.6 10*3/uL — ABNORMAL HIGH (ref 4.0–10.5)
nRBC: 0 % (ref 0.0–0.2)

## 2021-06-30 NOTE — Progress Notes (Signed)
? ?PROGRESS NOTE ? ? ? ?Ian Boyd  QQI:[id number] DOB: [birth year] DOA: 06/25/2021 ?PCP: Curlene Labrum, MD ? ? ?Brief Narrative: ?Ian Boyd is a 71 y.o. male with medical history significant for Hep C, blind right eye, dysphagia.  Patient presented to the ED with complaint of nausea vomiting diarrhea of about 2 weeks duration.  He also reports lower abdominal pain, both to the left and right side of his lower abdomen.  He presented to [facility], he was given a shot of an injection, and subsequently he declined even further, he could not walk, and then he also could not move his right arm.  Patient has had problems with his right shoulder for at least 3 years, but this significantly worsened over the past few days.  Found to have ecoli bacteremia with a psoas abscess.  Slowly improving ? ? ?Assessment and Plan: ?* Psoas abscess, right ([place]) ?CT findings concerning for right psoas abscess vs hematoma. Drain placed by IR on 4/6 with preliminary culture significant for gram negative rods ?-Continue drain per IR ?-Continue abx-- ID consult ?-wound culture- ecoli ? ?AKI (acute kidney injury) ([place]) ?Creatinine of 2.9 on admission.  Improving after nephrostomy tube placement.  ? ?Hydronephrosis of left kidney ?Hydronephrosis of the left kidney, with hydroureter, secondary to calculus obstruction at the left vesicoureteral junction.  Also with  urinary tract infection. Evaluated by urologist Dr. Alyson Ingles with recommendation for left nephrostomy tube placement which was performed on 4/6 by IR ? ?Severe sepsis (HCC)-resolved as of 06/27/2021 ?Secondary to bacteremia, psoas abscess and UTI. Physiology resolved. ? ?Rotator cuff tear arthropathy ?X-ray suggesting chronic rotator cuff injury.  Reports right shoulder problems for about 3 years now although now reports new right shoulder mass. ?-OT ?-MRI confirms known rotator cuff tear-- doubt any intervention would be considered until his infection has  cleared ? ?Urinary tract infection ?Symptomatic with dysuria, UA with positive leukocytes, with severe sepsis. Complicated by obstructive ureteral calculus. Urine culture with multiple species. ?-Continue abx for bacteremia and abscess ? ?Cocaine use ?Counseled on cessation. ? ?Hypokalemia ?-replete ? ?Thrombocytopenia ([place]) ?Unsure if acute or chronic. It is possible this is acute in setting of severe sepsis. Platelets of 88,000 on admission. Low of 43,000 and now rising. Some blood from nephrostomy tube but hemoglobin is currently stable. ?-CBC daily ? ?E coli bacteremia ?Presumed urinary source, although urine culture unhelpful. Leukocytosis improving. ?-Continue abx ?-ecoli- pan sensitive ? ? ?DVT prophylaxis: SCDs ?Code Status:   Code Status: Full Code ?Family Communication: Sister on telephone ?Disposition Plan: Discharge to CIR Medically stable for discharge likely in 1-2 days pending culture data, transition to oral antibiotics, IR recommendations ? ? ?Consultants:  ?Interventional radiology ?Urology ? ?Procedures:  ?Left percutaneous nephrostomy ?Right psoas abscess drain ? ? ? ?Subjective: ?C/o pain in right arm ? ?Objective: ?BP (!) 150/88 (BP Location: Left Arm)   Pulse 90   Temp 98.4 ?F (36.9 ?C) (Oral)   Resp 18   Ht 6' (1.829 m)   Wt 57.3 kg   SpO2 98%   BMI 17.13 kg/m?  ? ?Examination: ? ? ? ?General: Appearance:    Thin male in no acute distress  ?   ?Lungs:     respirations unlabored  ?Heart:    Normal heart rate.  ?  ?MS:   All extremities are intact.  ?  ?Neurologic:   Awake, alert  ?  ?  ? ? ?Data Reviewed: I have personally reviewed following labs and  imaging studies ? ?CBC ?Lab Results  ?Component Value Date  ? WBC 22.6 (H) 06/30/2021  ? RBC 3.02 (L) 06/30/2021  ? HGB 8.2 (L) 06/30/2021  ? HCT 25.1 (L) 06/30/2021  ? MCV 83.1 06/30/2021  ? MCH 27.2 06/30/2021  ? PLT 268 06/30/2021  ? MCHC 32.7 06/30/2021  ? RDW 15.0 06/30/2021  ? LYMPHSABS 1.1 06/25/2021  ? MONOABS 1.1 (H) 06/25/2021  ?  EOSABS 0.0 06/25/2021  ? BASOSABS 0.0 06/25/2021  ? ? ? ?Last metabolic panel ?Lab Results  ?Component Value Date  ? NA 131 (L) 06/30/2021  ? K 4.2 06/30/2021  ? CL 104 06/30/2021  ? CO2 22 06/30/2021  ? BUN 18 06/30/2021  ? CREATININE 1.09 06/30/2021  ? GLUCOSE 103 (H) 06/30/2021  ? GFRNONAA >60 06/30/2021  ? CALCIUM 8.2 (L) 06/30/2021  ? PROT 6.7 06/25/2021  ? ALBUMIN 2.2 (L) 06/25/2021  ? BILITOT 5.1 (H) 06/25/2021  ? ALKPHOS 192 (H) 06/25/2021  ? AST 47 (H) 06/25/2021  ? ALT 24 06/25/2021  ? ANIONGAP 5 06/30/2021  ? ? ?GFR: ?Estimated Creatinine Clearance: 48.9 mL/min (by C-G formula based on SCr of 1.09 mg/dL). ? ?Recent Results (from the past 240 hour(s))  ?Culture, blood (single)     Status: Abnormal  ? Collection Time: 06/25/21  2:15 PM  ? Specimen: Left Antecubital; Blood  ?Result Value Ref Range Status  ? Specimen Description   Final  ?  LEFT ANTECUBITAL ?Performed at [facility], [address], [place], [place] [address] ?  ? Special Requests   Final  ?  BOTTLES DRAWN AEROBIC AND ANAEROBIC Blood Culture adequate volume ?Performed at [facility], [address] [place], [place] [address] ?  ? Culture  Setup Time   Final  ?  GRAM NEGATIVE RODS ?IN BOTH AEROBIC AND ANAEROBIC BOTTLES ?Gram Stain Report Called to,Read Back By and Verified With: [place] [id number] BY THOMPSON S. ?CRITICAL RESULT CALLED TO, READ BACK BY AND VERIFIED WITH: PHARMD MASON WISE ON 06/26/21 @ 10:07 BY DRT ?Performed at [facility], [place] [address] [place], [place] [address] ?  ? Culture ESCHERICHIA COLI (A)  Final  ? Report Status 06/28/2021 FINAL  Final  ? Organism ID, Bacteria ESCHERICHIA COLI  Final  ?    Susceptibility  ? Escherichia coli - MIC*  ?  AMPICILLIN <=2 SENSITIVE Sensitive   ?  CEFAZOLIN <=4 SENSITIVE Sensitive   ?  CEFEPIME <=0.12 SENSITIVE Sensitive   ?  CEFTAZIDIME <=1 SENSITIVE Sensitive   ?  CEFTRIAXONE <=0.25 SENSITIVE Sensitive   ?  CIPROFLOXACIN <=0.25 SENSITIVE Sensitive   ?  GENTAMICIN <=1  SENSITIVE Sensitive   ?  IMIPENEM <=0.25 SENSITIVE Sensitive   ?  TRIMETH/SULFA <=20 SENSITIVE Sensitive   ?  AMPICILLIN/SULBACTAM <=2 SENSITIVE Sensitive   ?  PIP/TAZO <=4 SENSITIVE Sensitive   ?  * ESCHERICHIA COLI  ?Blood Culture ID Panel (Reflexed)     Status: Abnormal  ? Collection Time: 06/25/21  2:15 PM  ?Result Value Ref Range Status  ? Enterococcus faecalis NOT DETECTED NOT DETECTED Final  ? Enterococcus Faecium NOT DETECTED NOT DETECTED Final  ? Listeria monocytogenes NOT DETECTED NOT DETECTED Final  ? Staphylococcus species NOT DETECTED NOT DETECTED Final  ? Staphylococcus aureus (BCID) NOT DETECTED NOT DETECTED Final  ? Staphylococcus epidermidis NOT DETECTED NOT DETECTED Final  ? Staphylococcus lugdunensis NOT DETECTED NOT DETECTED Final  ? Streptococcus species NOT DETECTED NOT DETECTED Final  ? Streptococcus agalactiae  NOT DETECTED NOT DETECTED Final  ? Streptococcus pneumoniae NOT DETECTED NOT DETECTED Final  ? Streptococcus pyogenes NOT DETECTED NOT DETECTED Final  ? A.calcoaceticus-baumannii NOT DETECTED NOT DETECTED Final  ? Bacteroides fragilis NOT DETECTED NOT DETECTED Final  ? Enterobacterales DETECTED (A) NOT DETECTED Final  ?  Comment: Enterobacterales represent a large order of gram negative bacteria, not a single organism. ?CRITICAL RESULT CALLED TO, READ BACK BY AND VERIFIED WITH: ?PHARMD MASON WISE ON 06/26/21 @ 10:07 BY DRT ?  ? Enterobacter cloacae complex NOT DETECTED NOT DETECTED Final  ? Escherichia coli DETECTED (A) NOT DETECTED Final  ?  Comment: CRITICAL RESULT CALLED TO, READ BACK BY AND VERIFIED WITH: ?PHARMD MASON WISE ON 06/26/21 @ 10:07 BY DRT ?  ? Klebsiella aerogenes NOT DETECTED NOT DETECTED Final  ? Klebsiella oxytoca NOT DETECTED NOT DETECTED Final  ? Klebsiella pneumoniae NOT DETECTED NOT DETECTED Final  ? Proteus species NOT DETECTED NOT DETECTED Final  ? Salmonella species NOT DETECTED NOT DETECTED Final  ? Serratia marcescens NOT DETECTED NOT DETECTED Final  ?  Haemophilus influenzae NOT DETECTED NOT DETECTED Final  ? Neisseria meningitidis NOT DETECTED NOT DETECTED Final  ? Pseudomonas aeruginosa NOT DETECTED NOT DETECTED Final  ? Stenotrophomonas maltophilia NOT DETECTED NOT DETECTE

## 2021-06-30 NOTE — NC FL2 (Signed)
? MEDICAID FL2 LEVEL OF CARE SCREENING TOOL  ?  ? ?IDENTIFICATION  ?Patient Name: ?Ian Boyd Birthdate: [birth year] Sex: male Admission Date (Current Location): ?06/25/2021  ?[place] and [place] Number: ? [place] and Address:  ?[facility], [place] [address] [place], [place] [address] ?     Provider Number: ?[id number]  ?Attending Physician Name and Address:  ?Geradine Girt, DO ? Relative Name and Phone Number:  ?  ?   ?Current Level of Care: ?Hospital Recommended Level of Care: ?[place] Prior Approval Number: ?  ? ?Date Approved/Denied: ?  PASRR Number: ?[id number] A ? ?Discharge Plan: ?SNF ?  ? ?Current Diagnoses: ?Patient Active Problem List  ? Diagnosis Date Noted  ? Thrombocytopenia ([place]) 06/27/2021  ? Hypokalemia 06/27/2021  ? E coli bacteremia 06/26/2021  ? Psoas abscess, right ([place]) 06/25/2021  ? Hydronephrosis of left kidney 06/25/2021  ? Urinary tract infection 06/25/2021  ? Cocaine use 06/25/2021  ? Rotator cuff tear arthropathy 06/25/2021  ? Nephrolithiasis 06/25/2021  ? AKI (acute kidney injury) ([place]) 06/25/2021  ? Esophageal dysphagia 05/04/2018  ? Dysphagia 05/02/2018  ? Hepatitis C 05/02/2018  ? ? ?Orientation RESPIRATION BLADDER Height & Weight   ?  ?Self ? Normal External catheter, Continent Weight: 126 lb 5.2 oz (57.3 kg) ?Height:  6' (182.9 cm)  ?BEHAVIORAL SYMPTOMS/MOOD NEUROLOGICAL BOWEL NUTRITION STATUS  ?    Continent Diet (please see discharge summary)  ?AMBULATORY STATUS COMMUNICATION OF NEEDS Skin   ?  Verbally Normal ?  ?  ?  ?    ?     ?     ? ? ?Personal Care Assistance Level of Assistance  ?Bathing, Feeding, Dressing Bathing Assistance: Limited assistance ?Feeding assistance: Independent ?Dressing Assistance: Limited assistance ?   ? ?Functional Limitations Info  ?Sight, Hearing Sight Info: Impaired (blind in RT eye) ?Hearing Info: Adequate ?Speech Info: Adequate  ? ? ?SPECIAL CARE FACTORS FREQUENCY  ?PT (By licensed  PT), OT (By licensed OT)   ?  ?PT Frequency: 5x per week ?OT Frequency: 5x per week ?  ?  ?  ?   ? ? ?Contractures Contractures Info: Not present  ? ? ?Additional Factors Info  ?Code Status, Allergies Code Status Info: FULL ?Allergies Info: NKA ?  ?  ?  ?   ? ?Current Medications (06/30/2021):  This is the current hospital active medication list ?Current Facility-Administered Medications  ?Medication Dose Route Frequency Provider Last Rate Last Admin  ? acetaminophen (TYLENOL) tablet 650 mg  650 mg Oral Q6H PRN Emokpae, Ejiroghene E, MD   650 mg at 06/29/21 2033  ? Or  ? acetaminophen (TYLENOL) suppository 650 mg  650 mg Rectal Q6H PRN Emokpae, Ejiroghene E, MD      ? albuterol (PROVENTIL) (2.5 MG/3ML) 0.083% nebulizer solution 3 mL  3 mL Inhalation Q2H PRN Emokpae, Ejiroghene E, MD      ? brimonidine (ALPHAGAN) 0.2 % ophthalmic solution 1 drop  1 drop Left Eye BID Eulogio Bear U, DO   1 drop at 06/30/21 0957  ? ceFAZolin (ANCEF) IVPB 2g/100 mL premix  2 g Intravenous Q8H Vann, Jessica U, DO 200 mL/hr at 06/30/21 0525 2 g at 06/30/21 0525  ? Chlorhexidine Gluconate Cloth 2 % PADS 6 each  6 each Topical Daily Geradine Girt, DO   6 each at 06/30/21 0957  ? dorzolamide-timolol (COSOPT) 22.3-6.8 MG/ML ophthalmic solution 1 drop  1 drop Left Eye BID Eulogio Bear  U, DO   1 drop at 06/30/21 0957  ? latanoprost (XALATAN) 0.005 % ophthalmic solution 1 drop  1 drop Left Eye QHS Eulogio Bear U, DO   1 drop at 06/29/21 2034  ? lip balm (CARMEX) ointment   Topical PRN Mariel Aloe, MD   75 application. at 06/27/21 1358  ? ondansetron (ZOFRAN) tablet 4 mg  4 mg Oral Q6H PRN Emokpae, Ejiroghene E, MD      ? Or  ? ondansetron (ZOFRAN) injection 4 mg  4 mg Intravenous Q6H PRN Emokpae, Ejiroghene E, MD      ? oxyCODONE (Oxy IR/ROXICODONE) immediate release tablet 5 mg  5 mg Oral Q6H PRN Mariel Aloe, MD   5 mg at 06/30/21 1211  ? senna-docusate (Senokot-S) tablet 1 tablet  1 tablet Oral BID PRN Eulogio Bear U, DO   1  tablet at 06/28/21 2118  ? sodium chloride flush (NS) 0.9 % injection 5 mL  5 mL Intracatheter Q8H Mir, Paula Libra, MD   5 mL at 06/30/21 0524  ? tamsulosin (FLOMAX) capsule 0.4 mg  0.4 mg Oral QHS Emokpae, Ejiroghene E, MD   0.4 mg at 06/29/21 2034  ? ? ? ?Discharge Medications: ?Please see discharge summary for a list of discharge medications. ? ?Relevant Imaging Results: ? ?Relevant Lab Results: ? ? ?Additional Information ?SSN [id number] ? ?Vinie Sill, LCSW ? ? ? ? ?

## 2021-06-30 NOTE — TOC Initial Note (Signed)
Transition of Care (TOC) - Initial/Assessment Note  ? ? ?Patient Details  ?Name: Ian Boyd ?MRN: [id number] ?Date of Birth: June 13, 1950 ? ?Transition of Care (TOC) CM/SW Contact:    ?Vinie Sill, LCSW ?Phone Number: ?06/30/2021, 1:02 PM ? ?Clinical Narrative:                 ? ?Received call form patient's sister, she confirmed they ar agreeable to SNF placement and prefers placement in or near [place]. CSW explained the SNF process and possible placement barrier. She states understanding.  ? ?Thurmond Butts, MSW, LCSW ?Clinical Social Worker ? ? ? ?Expected Discharge Plan: [place] ?Barriers to Discharge: Ship broker, Continued Medical Work up, SNF Pending bed offer ? ? ?Patient Goals and CMS Choice ?  ?  ?  ? ?Expected Discharge Plan and Services ?Expected Discharge Plan: [place] ?In-house Referral: Clinical Social Work ?  ?  ?Living arrangements for the past 2 months: [place] ?                ?  ?  ?  ?  ?  ?  ?  ?  ?  ?  ? ?Prior Living Arrangements/Services ?Living arrangements for the past 2 months: [place] ?Lives with:: Self ?  ?       ?  ?  ?  ?  ? ?Activities of Daily Living ?Home Assistive Devices/Equipment: None ?ADL Screening (condition at time of admission) ?Patient's cognitive ability adequate to safely complete daily activities?: Yes ?Is the patient deaf or have difficulty hearing?: No ?Does the patient have difficulty seeing, even when wearing glasses/contacts?: No ?Does the patient have difficulty concentrating, remembering, or making decisions?: No ?Patient able to express need for assistance with ADLs?: Yes ?Does the patient have difficulty dressing or bathing?: No ?Independently performs ADLs?: Yes (appropriate for developmental age) ?Does the patient have difficulty walking or climbing stairs?: Yes ?Weakness of Legs: Both ?Weakness of Arms/Hands: Right ? ?Permission Sought/Granted ?  ?  ?   ?   ?   ?   ? ?Emotional  Assessment ?  ?  ?  ?Orientation: : Oriented to Self ?Alcohol / Substance Use: Illicit Drugs ?Psych Involvement: No (comment) ? ?Admission diagnosis:  Psoas abscess ([place]) [K68.12] ?Psoas abscess, right ([place]) [K68.12] ?AKI (acute kidney injury) ([place]) [N17.9] ?Ureteral stone with hydronephrosis [N13.2] ?Severe sepsis ([place]) [A41.9, R65.20] ?Sepsis, due to unspecified organism, unspecified whether acute organ dysfunction present ([place]) [A41.9] ?Patient Active Problem List  ? Diagnosis Date Noted  ? Thrombocytopenia ([place]) 06/27/2021  ? Hypokalemia 06/27/2021  ? E coli bacteremia 06/26/2021  ? Psoas abscess, right ([place]) 06/25/2021  ? Hydronephrosis of left kidney 06/25/2021  ? Urinary tract infection 06/25/2021  ? Cocaine use 06/25/2021  ? Rotator cuff tear arthropathy 06/25/2021  ? Nephrolithiasis 06/25/2021  ? AKI (acute kidney injury) ([place]) 06/25/2021  ? Esophageal dysphagia 05/04/2018  ? Dysphagia 05/02/2018  ? Hepatitis C 05/02/2018  ? ?PCP:  Curlene Labrum, MD ?Pharmacy:   ?[id number] [place] [place] - [id number]?Phone: [phone]: [phone]? ? ? ? ?Social Determinants of Health (SDOH) Interventions ?  ? ?Readmission Risk Interventions ?   ? View : No data to display.  ?  ?  ?  ? ? ? ?

## 2021-06-30 NOTE — Care Management Important Message (Signed)
Important Message ? ?Patient Details  ?Name: Ian Boyd ?MRN: [id number] ?Date of Birth: 1950/08/15 ? ? ?Medicare Important Message Given:  Yes ? ? ? ? ?Shelda Altes ?06/30/2021, 8:54 AM ?

## 2021-06-30 NOTE — Consult Note (Signed)
?  [facility] for Infectious Disease  ? ? ?Date of Admission:  06/25/2021    ? ?Reason for Consult: Bacteremia and Psoas Abscess ?    ?Referring Physician: Dr Eliseo Squires ? ?Current antibiotics: ?Cefazolin 4/9 - present ? ?Previous antibiotics: ?Ceftriaxone 4/6-4/8 ?Cefepime 4/5 ?MTZ 4/5 ?Vanco 4/5 ? ? ? ?ASSESSMENT:   ? ?71 y.o. male admitted with: ? ?E. coli bacteremia: Secondary to a urinary source with a left calculus obstruction at the vesicoureteral junction.  Status post left nephrostomy tube placement on 4/6. ?E. coli psoas abscess: Secondary to #1 and status post drain placement with IR on 4/6.  24-hour output has been recorded as approximately 10 cc. ?Acute kidney injury: Presented with creatinine 2.9 which has improved down to 1.09 today. ?Severe sepsis: Secondary to #1 and #2.  Improved. ?History of hepatitis C, genotype Ia: Unclear if previously treated. ? ?RECOMMENDATIONS:   ? ?Continue cefazolin IV ?Monitor psoas abscess drain output to determine timing of re-imaging ?Lab monitoring ?Will check hepatitis C RNA to assess if he has had any treatment.  If not, can be arranged as an outpatient ?Check HIV ?Will follow ? ? ?Principal Problem: ?  Psoas abscess, right ([place]) ?Active Problems: ?  Hydronephrosis of left kidney ?  Urinary tract infection ?  Cocaine use ?  Rotator cuff tear arthropathy ?  AKI (acute kidney injury) ([place]) ?  E coli bacteremia ?  Thrombocytopenia ([place]) ?  Hypokalemia ? ? ?MEDICATIONS:   ? ?Scheduled Meds: ?? brimonidine  1 drop Left Eye BID  ?? Chlorhexidine Gluconate Cloth  6 each Topical Daily  ?? dorzolamide-timolol  1 drop Left Eye BID  ?? latanoprost  1 drop Left Eye QHS  ?? Netarsudil Dimesylate  1 drop Left Eye QHS  ?? sodium chloride flush  5 mL Intracatheter Q8H  ?? tamsulosin  0.4 mg Oral QHS  ? ?Continuous Infusions: ??  ceFAZolin (ANCEF) IV 2 g (06/30/21 0525)  ? ?PRN Meds:.acetaminophen **OR** acetaminophen, albuterol, lip balm, ondansetron **OR** ondansetron (ZOFRAN) IV,  oxyCODONE, senna-docusate ? ?HPI:   ? ?Ian Boyd is a 71 y.o. male with past medical history of hepatitis C who presented to the hospital on 06/25/2021 with complaint of 2 weeks of abdominal pain and urinary symptoms.  This also coincided with 3 to 4 days of GI complaints of nausea and vomiting as well as fevers and chills.  He presented to an outside hospital and reports being given an injection in his buttocks.  However, he continued to have worsening symptoms and presented back to the hospital where he was noted to be febrile with severe leukocytosis.  He underwent CT scan which showed a right psoas abscess and left hydronephrosis and hydroureter secondary to an obstructing calculus at the left vesicoureteral junction.  He also had calculus in the medial facet in the vicinity of a posterior left bladder diverticulum with dependent stones.  He was evaluated by urology who recommended left nephrostomy tube placement which was done by IR on 4/6 along with right psoas drain placement.  He was started on antibiotics and cultures were obtained.  Blood cultures were positive for a pansensitive E. coli, urine cultures were negative, and abscess cultures grew the same E. coli as his blood cultures.  Additionally, his urinalysis was consistent with infection indicating pyuria and bacteria.  Over the last 24 hours his psoas abscess drain has put out approximately 10 cc of fluid.  He reports that he is feeling improved since admission, however, he  does continue to report some discomfort where his drain is.  He is not having any fevers or chills.  We have been consulted for antibiotic recommendations. ? ? ?Past Medical History:  ?Diagnosis Date  ?? Acid reflux   ?? Blind right eye   ?? Drug abuse ([place])   ? ? ?Social History  ? ?Tobacco Use  ?? Smoking status: Every Day  ?  Packs/day: 0.50  ?  Types: Cigarettes  ?? Smokeless tobacco: Never  ?? Tobacco comments:  ?  1/2 pack a day  ?Vaping Use  ?? Vaping Use: Never used   ?Substance Use Topics  ?? Alcohol use: Never  ?  Comment: quit 5-7 yrs ago.   ?? Drug use: Yes  ?  Types: Cocaine, Marijuana  ?  Comment: Does marijuana and cocaine.   ? ? ?History reviewed. No pertinent family history. ? ?No Known Allergies ? ?Review of Systems  ?Constitutional:  Positive for fever.  ?Respiratory: Negative.    ?Cardiovascular: Negative.   ?Gastrointestinal:  Positive for abdominal pain.  ?Genitourinary:  Positive for dysuria and flank pain.  ?All other systems reviewed and are negative. ? ?OBJECTIVE:  ? ?Blood pressure (!) 150/88, pulse 90, temperature 98.4 ?F (36.9 ?C), temperature source Oral, resp. rate 18, height 6' (1.829 m), weight 57.3 kg, SpO2 98 %. ?Body mass index is 17.13 kg/m?. ? ?Physical Exam ?Constitutional:   ?   General: He is not in acute distress. ?   Appearance: He is well-developed.  ?HENT:  ?   Head: Normocephalic and atraumatic.  ?Eyes:  ?   Extraocular Movements: Extraocular movements intact.  ?   Conjunctiva/sclera: Conjunctivae normal.  ?Cardiovascular:  ?   Rate and Rhythm: Normal rate and regular rhythm.  ?   Heart sounds: No murmur heard. ?Pulmonary:  ?   Effort: Pulmonary effort is normal. No respiratory distress.  ?   Breath sounds: Normal breath sounds.  ?Abdominal:  ?   General: There is no distension.  ?   Palpations: Abdomen is soft.  ?   Tenderness: There is no abdominal tenderness.  ?Genitourinary: ?   Comments: Nephrostomy tube and Psoas abscess drain in place.  ?Musculoskeletal:     ?   General: Normal range of motion.  ?   Cervical back: Normal range of motion and neck supple.  ?Skin: ?   General: Skin is warm and dry.  ?   Findings: No rash.  ?Neurological:  ?   General: No focal deficit present.  ?   Mental Status: He is alert and oriented to person, place, and time.  ?Psychiatric:     ?   Mood and Affect: Mood normal.     ?   Behavior: Behavior normal.  ? ? ? ?Lab Results: ?Lab Results  ?Component Value Date  ? WBC 22.6 (H) 06/30/2021  ? HGB 8.2 (L)  06/30/2021  ? HCT 25.1 (L) 06/30/2021  ? MCV 83.1 06/30/2021  ? PLT 268 06/30/2021  ?  ?Lab Results  ?Component Value Date  ? NA 131 (L) 06/30/2021  ? K 4.2 06/30/2021  ? CO2 22 06/30/2021  ? GLUCOSE 103 (H) 06/30/2021  ? BUN 18 06/30/2021  ? CREATININE 1.09 06/30/2021  ? CALCIUM 8.2 (L) 06/30/2021  ? GFRNONAA >60 06/30/2021  ?  ?Lab Results  ?Component Value Date  ? ALT 24 06/25/2021  ? AST 47 (H) 06/25/2021  ? ALKPHOS 192 (H) 06/25/2021  ? BILITOT 5.1 (H) 06/25/2021  ? ? ?No results found  for: CRP ? ?No results found for: ESRSEDRATE ? ?I have reviewed the micro and lab results in Epic. ? ?Imaging: ?No results found.  ? ?Imaging independently reviewed in Epic. ? ?Mignon Pine ?[facility] for Infectious Disease ?[place] ?[phone] pager ?06/30/2021, 12:19 PM ? ? ?

## 2021-06-30 NOTE — Progress Notes (Signed)
Inpatient Rehab Admissions Coordinator:  ? ? I spoke with pt.'s sister, Bascom Levels, regarding potential CIR admit. She states that family's original plan to provide 24/7 support at d/c (which Pt. Will need) has fallen through. She would like to pursue SNF instead of CIR. I will withdraw our case for CIR and notify TOC. ? ?Clemens Catholic, MS, CCC-SLP ?Rehab Admissions Coordinator  ?[phone] (celll) ?[phone] (office) ? ?

## 2021-06-30 NOTE — TOC Progression Note (Signed)
Transition of Care (TOC) - Progression Note  ? ? ?Patient Details  ?Name: Ian Boyd ?MRN: [id number] ?Date of Birth: Aug 01, 1950 ? ?Transition of Care (TOC) CM/SW Contact  ?Vinie Sill, LCSW ?Phone Number: ?06/30/2021, 12:47 PM ? ?Clinical Narrative:    ? ?CSW received message from CIR, per patient's family they are unable to provide 24/7 care after discharge. No longer wanted CIR but SNF.  ? ?CSW called patient's sister, Ian Boyd, left voice message to return call. However, since family has expressed to CIR they wanted to pursue SNF- FL2 was completed and referrals sent to SNF.  ? ?TOC will continue to follow and assist with discharge planning.  ? ?Expected Discharge Plan: [place] ?Barriers to Discharge: Ship broker, Continued Medical Work up, SNF Pending bed offer ? ?Expected Discharge Plan and Services ?Expected Discharge Plan: [place] ?In-house Referral: Clinical Social Work ?  ?  ?Living arrangements for the past 2 months: [place] ?                ?  ?  ?  ?  ?  ?  ?  ?  ?  ?  ? ? ?Social Determinants of Health (SDOH) Interventions ?  ? ?Readmission Risk Interventions ?   ? View : No data to display.  ?  ?  ?  ? ? ?

## 2021-06-30 NOTE — Progress Notes (Signed)
PT Cancellation Note ? ?Patient Details ?Name: Ian Boyd ?MRN: [id number] ?DOB: March 31, 1950 ? ? ?Cancelled Treatment:    Reason Eval/Treat Not Completed: Patient declined, no reason specified ? "I am comfortable right now," declining mobility. Will follow. ? ?Annslee Tercero A Ocia Simek ?06/30/2021, 3:32 PM ?Marisa Severin, PT, DPT ?Acute Rehabilitation Services ?Secure chat preferred ?Office [phone] ? ? ? ?

## 2021-07-01 DIAGNOSIS — E871 Hypo-osmolality and hyponatremia: Secondary | ICD-10-CM

## 2021-07-01 DIAGNOSIS — K6812 Psoas muscle abscess: Secondary | ICD-10-CM | POA: Diagnosis not present

## 2021-07-01 DIAGNOSIS — N132 Hydronephrosis with renal and ureteral calculous obstruction: Secondary | ICD-10-CM

## 2021-07-01 LAB — CBC
HCT: 26.4 % — ABNORMAL LOW (ref 39.0–52.0)
Hemoglobin: 8.8 g/dL — ABNORMAL LOW (ref 13.0–17.0)
MCH: 27.8 pg (ref 26.0–34.0)
MCHC: 33.3 g/dL (ref 30.0–36.0)
MCV: 83.5 fL (ref 80.0–100.0)
Platelets: 406 10*3/uL — ABNORMAL HIGH (ref 150–400)
RBC: 3.16 MIL/uL — ABNORMAL LOW (ref 4.22–5.81)
RDW: 15.4 % (ref 11.5–15.5)
WBC: 16.7 10*3/uL — ABNORMAL HIGH (ref 4.0–10.5)
nRBC: 0 % (ref 0.0–0.2)

## 2021-07-01 LAB — AEROBIC/ANAEROBIC CULTURE W GRAM STAIN (SURGICAL/DEEP WOUND)

## 2021-07-01 LAB — BASIC METABOLIC PANEL
Anion gap: 6 (ref 5–15)
BUN: 15 mg/dL (ref 8–23)
CO2: 23 mmol/L (ref 22–32)
Calcium: 8.3 mg/dL — ABNORMAL LOW (ref 8.9–10.3)
Chloride: 101 mmol/L (ref 98–111)
Creatinine, Ser: 0.97 mg/dL (ref 0.61–1.24)
GFR, Estimated: >60 mL/min (ref >60)
Glucose, Bld: 110 mg/dL — ABNORMAL HIGH (ref 70–99)
Potassium: 4.3 mmol/L (ref 3.5–5.1)
Sodium: 130 mmol/L — ABNORMAL LOW (ref 135–145)

## 2021-07-01 LAB — HIV ANTIBODY (ROUTINE TESTING W REFLEX): HIV Screen 4th Generation wRfx: NONREACTIVE

## 2021-07-01 LAB — SODIUM, URINE, RANDOM: Sodium, Ur: 133 mmol/L

## 2021-07-01 LAB — OSMOLALITY, URINE: Osmolality, Ur: 527 mOsm/kg (ref 300–900)

## 2021-07-01 NOTE — Assessment & Plan Note (Signed)
-  check urine Na and urine osmo ?-check serum osmo ?

## 2021-07-01 NOTE — Progress Notes (Signed)
? ?PROGRESS NOTE ? ? ? ?Ian Boyd  YTK:[id number] DOB: [birth year] DOA: 06/25/2021 ?PCP: Curlene Labrum, MD ? ? ?Brief Narrative: ?Ian Boyd is a 71 y.o. male with medical history significant for Hep C, blind right eye, dysphagia.  Patient presented to the ED with complaint of nausea vomiting diarrhea of about 2 weeks duration.  He also reports lower abdominal pain, both to the left and right side of his lower abdomen.  He presented to [facility], he was given a shot of an injection, and subsequently he declined even further, he could not walk, and then he also could not move his right arm.  Patient has had problems with his right shoulder for at least 3 years, but this significantly worsened over the past few days.  Found to have ecoli bacteremia with a psoas abscess.  Slowly improving. Will need SNF once plan for abx in place.  ? ? ?Assessment and Plan: ?* Psoas abscess, right ([place]) ?CT findings concerning for right psoas abscess vs hematoma. Drain placed by IR on 4/6 with preliminary culture significant for gram negative rods ?-Continue drain per IR ?-Continue abx-- ID consult ?-wound culture- ecoli ? ?AKI (acute kidney injury) ([place]) ?Creatinine of 2.9 on admission.  Improved after nephrostomy tube placement.  ? ?Hydronephrosis of left kidney ?Hydronephrosis of the left kidney, with hydroureter, secondary to calculus obstruction at the left vesicoureteral junction.  Also with  urinary tract infection. Evaluated by urologist Dr. Alyson Ingles with recommendation for left nephrostomy tube placement which was performed on 4/6 by IR ? ?Severe sepsis (HCC)-resolved as of 06/27/2021 ?Secondary to bacteremia, psoas abscess and UTI. Physiology resolved. ? ?Rotator cuff tear arthropathy ?X-ray suggesting chronic rotator cuff injury.  Reports right shoulder problems for about 3 years now although now reports new right shoulder mass. ?-OT ?-MRI confirms known rotator cuff tear-- doubt any intervention would  be considered until his infection has cleared ? ?Urinary tract infection ?Symptomatic with dysuria, UA with positive leukocytes, with severe sepsis. Complicated by obstructive ureteral calculus. Urine culture with multiple species. ?-Continue abx for bacteremia and abscess ? ?Cocaine use ?Counseled on cessation. ? ?Hyponatremia ?-check urine Na and urine osmo ?-check serum osmo ? ?Hypokalemia ?-replete ? ?Thrombocytopenia ([place]) ?Unsure if acute or chronic. It is possible this is acute in setting of severe sepsis. Platelets of 88,000 on admission. Low of 43,000 and now rising. Some blood from nephrostomy tube but hemoglobin is currently stable. ?-CBC daily ? ?E coli bacteremia ?Presumed urinary source, although urine culture unhelpful. Leukocytosis improving. ?-Continue abx ?-ecoli- pan sensitive ? ? ?DVT prophylaxis: SCDs ?Code Status:   Code Status: Full Code ?Family Communication: no family at bedside ?Disposition Plan: once abx/drain plan in place- plan for SNF ? ? ?Consultants:  ?Interventional radiology ?Urology ? ?Procedures:  ?Left percutaneous nephrostomy ?Right psoas abscess drain ? ? ? ?Subjective: ?Asking for help with setting up his breakfast ? ?Objective: ?BP (!) 153/84 (BP Location: Left Arm)   Pulse 87   Temp 98.1 ?F (36.7 ?C) (Oral)   Resp 18   Ht 6' (1.829 m)   Wt 57.3 kg   SpO2 99%   BMI 17.13 kg/m?  ? ?Examination: ? ? ? ?General: Appearance:    Thin male in no acute distress  ?   ?Lungs:     respirations unlabored  ?Heart:    Normal heart rate.  ?  ?MS:   All extremities are intact.  ?  ?Neurologic:   Awake, alert, pleasant and cooperative  ?  ?  ?  ? ? ?  Data Reviewed: I have personally reviewed following labs and imaging studies ? ?CBC ?Lab Results  ?Component Value Date  ? WBC 16.7 (H) 07/01/2021  ? RBC 3.16 (L) 07/01/2021  ? HGB 8.8 (L) 07/01/2021  ? HCT 26.4 (L) 07/01/2021  ? MCV 83.5 07/01/2021  ? MCH 27.8 07/01/2021  ? PLT 406 (H) 07/01/2021  ? MCHC 33.3 07/01/2021  ? RDW 15.4  07/01/2021  ? LYMPHSABS 1.1 06/25/2021  ? MONOABS 1.1 (H) 06/25/2021  ? EOSABS 0.0 06/25/2021  ? BASOSABS 0.0 06/25/2021  ? ? ? ?Last metabolic panel ?Lab Results  ?Component Value Date  ? NA 130 (L) 07/01/2021  ? K 4.3 07/01/2021  ? CL 101 07/01/2021  ? CO2 23 07/01/2021  ? BUN 15 07/01/2021  ? CREATININE 0.97 07/01/2021  ? GLUCOSE 110 (H) 07/01/2021  ? GFRNONAA >60 07/01/2021  ? CALCIUM 8.3 (L) 07/01/2021  ? PROT 6.7 06/25/2021  ? ALBUMIN 2.2 (L) 06/25/2021  ? BILITOT 5.1 (H) 06/25/2021  ? ALKPHOS 192 (H) 06/25/2021  ? AST 47 (H) 06/25/2021  ? ALT 24 06/25/2021  ? ANIONGAP 6 07/01/2021  ? ? ?GFR: ?Estimated Creatinine Clearance: 55 mL/min (by C-G formula based on SCr of 0.97 mg/dL). ? ?Recent Results (from the past 240 hour(s))  ?Culture, blood (single)     Status: Abnormal  ? Collection Time: 06/25/21  2:15 PM  ? Specimen: Left Antecubital; Blood  ?Result Value Ref Range Status  ? Specimen Description   Final  ?  LEFT ANTECUBITAL ?Performed at [facility], [address], [place], [place] [address] ?  ? Special Requests   Final  ?  BOTTLES DRAWN AEROBIC AND ANAEROBIC Blood Culture adequate volume ?Performed at [facility], [address], [place], [place] [address] ?  ? Culture  Setup Time   Final  ?  GRAM NEGATIVE RODS ?IN BOTH AEROBIC AND ANAEROBIC BOTTLES ?Gram Stain Report Called to,Read Back By and Verified With: Mansura [id number] BY THOMPSON S. ?CRITICAL RESULT CALLED TO, READ BACK BY AND VERIFIED WITH: PHARMD MASON WISE ON 06/26/21 @ 10:07 BY DRT ?Performed at [facility], [place] [address], [place], [place] [address] ?  ? Culture ESCHERICHIA COLI (A)  Final  ? Report Status 06/28/2021 FINAL  Final  ? Organism ID, Bacteria ESCHERICHIA COLI  Final  ?    Susceptibility  ? Escherichia coli - MIC*  ?  AMPICILLIN <=2 SENSITIVE Sensitive   ?  CEFAZOLIN <=4 SENSITIVE Sensitive   ?  CEFEPIME <=0.12 SENSITIVE Sensitive   ?  CEFTAZIDIME <=1 SENSITIVE Sensitive   ?  CEFTRIAXONE <=0.25 SENSITIVE Sensitive    ?  CIPROFLOXACIN <=0.25 SENSITIVE Sensitive   ?  GENTAMICIN <=1 SENSITIVE Sensitive   ?  IMIPENEM <=0.25 SENSITIVE Sensitive   ?  TRIMETH/SULFA <=20 SENSITIVE Sensitive   ?  AMPICILLIN/SULBACTAM <=2 SENSITIVE Sensitive   ?  PIP/TAZO <=4 SENSITIVE Sensitive   ?  * ESCHERICHIA COLI  ?Blood Culture ID Panel (Reflexed)     Status: Abnormal  ? Collection Time: 06/25/21  2:15 PM  ?Result Value Ref Range Status  ? Enterococcus faecalis NOT DETECTED NOT DETECTED Final  ? Enterococcus Faecium NOT DETECTED NOT DETECTED Final  ? Listeria monocytogenes NOT DETECTED NOT DETECTED Final  ? Staphylococcus species NOT DETECTED NOT DETECTED Final  ? Staphylococcus aureus (BCID) NOT DETECTED NOT DETECTED Final  ? Staphylococcus epidermidis NOT DETECTED NOT DETECTED Final  ? Staphylococcus lugdunensis NOT DETECTED NOT DETECTED Final  ? Streptococcus  species NOT DETECTED NOT DETECTED Final  ? Streptococcus agalactiae NOT DETECTED NOT DETECTED Final  ? Streptococcus pneumoniae NOT DETECTED NOT DETECTED Final  ? Streptococcus pyogenes NOT DETECTED NOT DETECTED Final  ? A.calcoaceticus-baumannii NOT DETECTED NOT DETECTED Final  ? Bacteroides fragilis NOT DETECTED NOT DETECTED Final  ? Enterobacterales DETECTED (A) NOT DETECTED Final  ?  Comment: Enterobacterales represent a large order of gram negative bacteria, not a single organism. ?CRITICAL RESULT CALLED TO, READ BACK BY AND VERIFIED WITH: ?PHARMD MASON WISE ON 06/26/21 @ 10:07 BY DRT ?  ? Enterobacter cloacae complex NOT DETECTED NOT DETECTED Final  ? Escherichia coli DETECTED (A) NOT DETECTED Final  ?  Comment: CRITICAL RESULT CALLED TO, READ BACK BY AND VERIFIED WITH: ?PHARMD MASON WISE ON 06/26/21 @ 10:07 BY DRT ?  ? Klebsiella aerogenes NOT DETECTED NOT DETECTED Final  ? Klebsiella oxytoca NOT DETECTED NOT DETECTED Final  ? Klebsiella pneumoniae NOT DETECTED NOT DETECTED Final  ? Proteus species NOT DETECTED NOT DETECTED Final  ? Salmonella species NOT DETECTED NOT DETECTED Final  ?  Serratia marcescens NOT DETECTED NOT DETECTED Final  ? Haemophilus influenzae NOT DETECTED NOT DETECTED Final  ? Neisseria meningitidis NOT DETECTED NOT DETECTED Final  ? Pseudomonas aeruginosa NOT DETECTED NOT DE

## 2021-07-01 NOTE — Progress Notes (Signed)
?   ? ?[facility] for Infectious Disease ? ?Date of Admission:  06/25/2021    ?       ?Reason for visit: Follow up on Bacteremia and Psoas Abscess ?                                     ?Current antibiotics: ?Cefazolin 4/9 - present ?  ?Previous antibiotics: ?Ceftriaxone 4/6-4/8 ?Cefepime 4/5 ?MTZ 4/5 ?Vanco 4/5 ? ? ? ?ASSESSMENT:   ? ?71 y.o. male admitted with: ? ?E. coli bacteremia: Secondary to a urinary source with obstructing left calculus at the vesicoureteral junction.  Status post left nephrostomy tube placement 4/6. ?E. coli psoas abscess: Secondary to #1 and status post IR drain placement 4/6. ?Acute kidney injury: Presented with creatinine 2.9 which is improved following intervention. ?Severe sepsis: Secondary to #1 and #2.  This has improved. ?History of hepatitis C, genotype Ia: Unclear if previously treated and HCVRNA pending. ? ?RECOMMENDATIONS:   ? ?Continue cefazolin ?Monitor psoas abscess drain output to determine timing of reimaging ?Lab monitoring ?Follow-up hepatitis C RNA ?HIV is negative ?We will follow ? ? ?Principal Problem: ?  Psoas abscess, right ([place]) ?Active Problems: ?  Hydronephrosis of left kidney ?  Urinary tract infection ?  Cocaine use ?  Rotator cuff tear arthropathy ?  AKI (acute kidney injury) ([place]) ?  E coli bacteremia ?  Thrombocytopenia ([place]) ?  Hypokalemia ? ? ? ?MEDICATIONS:   ? ?Scheduled Meds: ?? brimonidine  1 drop Left Eye BID  ?? Chlorhexidine Gluconate Cloth  6 each Topical Daily  ?? dorzolamide-timolol  1 drop Left Eye BID  ?? latanoprost  1 drop Left Eye QHS  ?? sodium chloride flush  5 mL Intracatheter Q8H  ?? tamsulosin  0.4 mg Oral QHS  ? ?Continuous Infusions: ??  ceFAZolin (ANCEF) IV 2 g (07/01/21 0532)  ? ?PRN Meds:.acetaminophen **OR** acetaminophen, albuterol, lip balm, ondansetron **OR** ondansetron (ZOFRAN) IV, oxyCODONE, senna-docusate ? ?SUBJECTIVE:  ? ?24 hour events:  ?No acute events were noted overnight ?Patient is afebrile, Tmax 98.4 ?Psoas  abscess drain output over the last 24 hours has been 25 cc ?Left nephrostomy tube remains in place ?Currently on cefazolin ?HIV and hepatitis C testing pending ?No other new labs ?No new imaging ? ? ?Patient has no complaints this morning.  Afebrile.  States he will be discharging to a SNF and his sister is helping him to work on that. ? ?Review of Systems  ?All other systems reviewed and are negative. ? ?  ?OBJECTIVE:  ? ?Blood pressure (!) 153/84, pulse 87, temperature 98.1 ?F (36.7 ?C), temperature source Oral, resp. rate 18, height 6' (1.829 m), weight 57.3 kg, SpO2 99 %. ?Body mass index is 17.13 kg/m?. ? ?Physical Exam ?Constitutional:   ?   General: He is not in acute distress. ?   Appearance: Normal appearance.  ?HENT:  ?   Head: Normocephalic and atraumatic.  ?Eyes:  ?   Extraocular Movements: Extraocular movements intact.  ?   Conjunctiva/sclera: Conjunctivae normal.  ?Pulmonary:  ?   Effort: Pulmonary effort is normal. No respiratory distress.  ?Abdominal:  ?   General: There is no distension.  ?   Palpations: Abdomen is soft.  ?   Tenderness: There is no abdominal tenderness.  ?Musculoskeletal:  ?   Comments: Psoas abscess drain in place ?Nephrostomy tube in place  ?Skin: ?   General:  Skin is warm and dry.  ?Neurological:  ?   General: No focal deficit present.  ?   Mental Status: He is alert and oriented to person, place, and time.  ?Psychiatric:     ?   Mood and Affect: Mood normal.     ?   Behavior: Behavior normal.  ? ? ? ?Lab Results: ?Lab Results  ?Component Value Date  ? WBC 22.6 (H) 06/30/2021  ? HGB 8.2 (L) 06/30/2021  ? HCT 25.1 (L) 06/30/2021  ? MCV 83.1 06/30/2021  ? PLT 268 06/30/2021  ?  ?Lab Results  ?Component Value Date  ? NA 131 (L) 06/30/2021  ? K 4.2 06/30/2021  ? CO2 22 06/30/2021  ? GLUCOSE 103 (H) 06/30/2021  ? BUN 18 06/30/2021  ? CREATININE 1.09 06/30/2021  ? CALCIUM 8.2 (L) 06/30/2021  ? GFRNONAA >60 06/30/2021  ?  ?Lab Results  ?Component Value Date  ? ALT 24 06/25/2021  ? AST  47 (H) 06/25/2021  ? ALKPHOS 192 (H) 06/25/2021  ? BILITOT 5.1 (H) 06/25/2021  ? ? ?No results found for: CRP ? ?No results found for: ESRSEDRATE ?  ?I have reviewed the micro and lab results in Epic. ? ?Imaging: ?No results found.  ? ?Imaging independently reviewed in Epic.  ? ? ?Mignon Pine ?[facility] for Infectious Disease ?[place] ?[phone] pager ?07/01/2021, 8:03 AM ? ? ?

## 2021-07-01 NOTE — TOC Progression Note (Signed)
Transition of Care (TOC) - Progression Note  ? ? ?Patient Details  ?Name: Ian Boyd ?MRN: [id number] ?Date of Birth: 26-May-1950 ? ?Transition of Care (TOC) CM/SW Contact  ?Vinie Sill, LCSW ?Phone Number: ?07/01/2021, 1:33 PM ? ?Clinical Narrative:    ? ?CSW spoke with patient's sister- provided bed offers. She expressed really wanting a bed in the [place] area but unfortunately only received offers in [place]. She accepted [facility] as her first choice if no there offers come available.  ? ?Offers remains pending for [facility] and [place] left voice message to return call. ? ?TOC will continue to follow and assist with discharge planning. ? ?Thurmond Butts, MSW, LCSW ?Clinical Social Worker ? ? ? ?Expected Discharge Plan: [place] ?Barriers to Discharge: Ship broker, Continued Medical Work up, SNF Pending bed offer ? ?Expected Discharge Plan and Services ?Expected Discharge Plan: [place] ?In-house Referral: Clinical Social Work ?  ?  ?Living arrangements for the past 2 months: [place] ?                ?  ?  ?  ?  ?  ?  ?  ?  ?  ?  ? ? ?Social Determinants of Health (SDOH) Interventions ?  ? ?Readmission Risk Interventions ?   ? View : No data to display.  ?  ?  ?  ? ? ?

## 2021-07-01 NOTE — Progress Notes (Signed)
? ? ?Referring Physician(s): ?Dr Princella Ion ? ?Supervising Physician: Aletta Edouard ? ?Patient Status:  [facility] - In-pt ? ?Chief Complaint: ? ?1. Left percutaneous nephrostomy 2. Right psoas abscess drain placed by Dr. Dwaine Gale 06/26/21 ? ?Subjective: ? ?Doing well ?No complaints ?No pain ?OP from both drains is being recorded ?OP PCN yellow-- few clots in bag ?OP from Rt psoas- bloody- flushes easily ? ?Allergies: ?Patient has no known allergies. ? ?Medications: ?Prior to Admission medications   ?Medication Sig Start Date End Date Taking? Authorizing Provider  ?Albuterol Sulfate, sensor, (PROAIR DIGIHALER) 108 (90 Base) MCG/ACT AEPB Inhale 1 puff into the lungs daily as needed (shortness of breath).   Yes [provider]  ?brimonidine (ALPHAGAN) 0.2 % ophthalmic solution SMARTSIG:In Eye(s) 06/22/21  Yes [provider]  ?dorzolamide-timolol (COSOPT) 22.3-6.8 MG/ML ophthalmic solution Place 1 drop into the left eye 2 (two) times daily. 05/05/21  Yes [provider]  ?ibuprofen (ADVIL) 600 MG tablet Take 600 mg by mouth every 6 (six) hours as needed. 06/19/21  Yes [provider]  ?Multiple Vitamin (MULTIVITAMIN) tablet Take 1 tablet by mouth daily.   Yes [provider]  ?Phenyleph-Doxylamine-DM-APAP (NYQUIL SEVERE COLD/FLU PO) Take 1 Dose by mouth at bedtime as needed (congestion).   Yes [provider]  ?RHOPRESSA 0.02 % SOLN Place 1 drop into the left eye at bedtime. 05/05/21  Yes [provider]  ?tamsulosin (FLOMAX) 0.4 MG CAPS capsule Take 0.4 mg by mouth at bedtime. 04/17/21  Yes [provider]  ?tiotropium (SPIRIVA) 18 MCG inhalation capsule Place 18 mcg into inhaler and inhale daily.   Yes [provider]  ?VYZULTA 0.024 % SOLN Place 1 drop into the left eye at bedtime. 05/05/21  Yes [provider]  ?pantoprazole (PROTONIX) 40 MG tablet TAKE 1 TABLET (40 MG TOTAL) BY MOUTH 2 (TWO) TIMES DAILY BEFORE A MEAL. ?Patient not taking:  Reported on 06/25/2021 08/04/18   Rogene Houston, MD  ?polyethylene glycol-electrolytes (TRILYTE) 420 g solution Take 4,000 mLs by mouth as directed. ?Patient not taking: Reported on 06/25/2021 08/29/20   Harvel Quale, MD  ? ? ? ?Vital Signs: ?BP (!) 153/84 (BP Location: Left Arm)   Pulse 87   Temp 98.1 ?F (36.7 ?C) (Oral)   Resp 18   Ht 6' (1.829 m)   Wt 126 lb 5.2 oz (57.3 kg)   SpO2 99%   BMI 17.13 kg/m?  ? ?Physical Exam ?Skin: ?   Comments: Sites are clean and dry ? ?Left PCN- OP yellow with a few clots in bag ?475 cc yesterday-- 200 today ? ?Rt Psoas:  OP bloody ?Flushes easily ?OP only 10 cc yesterday; 15 cc today--- I removed a stop cock and now flows straight into drain tubing ?Organism ID, Bacteria ESCHERICHIA COLI  ? ?  ? ? ?Imaging: ?MR SHOULDER RIGHT WO CONTRAST ? ?Result Date: 06/27/2021 ?CLINICAL DATA:  Shoulder pain. Rotator cuff disorder suspected. Patient reports shoulder problems for at least 3 years with recent worsening. EXAM: MRI OF THE RIGHT SHOULDER WITHOUT CONTRAST TECHNIQUE: Multiplanar, multisequence MR imaging of the shoulder was performed. No intravenous contrast was administered. COMPARISON:  Radiographs 06/25/2021. FINDINGS: Despite efforts by the technologist and patient, mild motion artifact is present on today's exam and could not be eliminated. This reduces exam sensitivity and specificity. Rotator cuff: There is a massive chronic full-thickness rotator cuff tear involving the supraspinatus and infraspinatus tendons. Both tendons are moderately retracted. The subscapularis and teres minor tendons appear  intact. Muscles: Marked atrophy of the supraspinatus and infraspinatus muscles consistent with chronic rotator cuff tears. Biceps long head: Appears intact and normally positioned within the bicipital groove. Probable tendinosis of the intra-articular portion. Acromioclavicular Joint: The acromion is type 2. There are mild acromioclavicular degenerative changes. There  are erosive changes in the undersurface of the acromion due to the high-riding humeral head. No significant fluid in the subacromial-subdeltoid bursa. Glenohumeral Joint: High-riding humeral head and mild glenohumeral degenerative changes. No significant shoulder joint effusion. Labrum: Labral assessment limited by the lack of joint fluid. Mild superior labral degeneration. Bones: No acute or significant extra-articular osseous findings. Other: No significant soft tissue findings. IMPRESSION: 1. Chronic massive full-thickness rotator cuff tear involving the supraspinatus and infraspinatus tendons as described. Associated tendon retraction and muscular atrophy. 2. High-riding humeral head with erosion of the undersurface of the acromion and superior labral degeneration. Electronically Signed   By: Richardean Sale M.D.   On: 06/27/2021 16:20   ? ?Labs: ? ?CBC: ?Recent Labs  ?  06/26/21 ?0312 06/27/21 ?0122 06/28/21 ?0135 06/30/21 ?9147  ?WBC 44.0* 24.0* 18.9* 22.6*  ?HGB 9.2* 9.3* 9.7* 8.2*  ?HCT 27.0* 27.3* 28.3* 25.1*  ?PLT 43* 62* 87* 268  ? ? ?COAGS: ?Recent Labs  ?  06/26/21 ?0913  ?INR 1.3*  ? ? ?BMP: ?Recent Labs  ?  06/26/21 ?0312 06/27/21 ?0122 06/28/21 ?0135 06/30/21 ?8295  ?NA 139 138 134* 131*  ?K 3.6 3.2* 3.9 4.2  ?CL 110 112* 109 104  ?CO2 16* 19* 20* 22  ?GLUCOSE 88 92 100* 103*  ?BUN 61* 50* 32* 18  ?CALCIUM 7.2* 7.6* 7.9* 8.2*  ?CREATININE 2.48* 1.69* 1.30* 1.09  ?GFRNONAA 27* 42* 58* >60  ? ? ?LIVER FUNCTION TESTS: ?Recent Labs  ?  06/25/21 ?1317  ?BILITOT 5.1*  ?AST 47*  ?ALT 24  ?ALKPHOS 192*  ?PROT 6.7  ?ALBUMIN 2.2*  ? ? ?Assessment and Plan: ? ?Left PCN intact ?Rt psoas abscess drain intact ?Will follow ?Continue to flush abscess drain TID ?Record output of both in chart ? ?Electronically Signed: ?Lavonia Drafts, PA-C ?07/01/2021, 8:08 AM ? ? ?I spent a total of 15 Minutes at the the patient's bedside AND on the patient's hospital floor or unit, greater than 50% of which was  counseling/coordinating care for L PCN; R Psoas abscess ? ? ? ?  ?

## 2021-07-01 NOTE — Progress Notes (Signed)
PT Cancellation Note ? ?Patient Details ?Name: Ian Boyd ?MRN: [id number] ?DOB: February 17, 1951 ? ? ?Cancelled Treatment:    Reason Eval/Treat Not Completed: (P) Patient declined, no reason specified (pt eating lunch ~3pm, defer until later in day. Did not have time to reattempt.) ? ? ?[name] ?07/01/2021, 5:11 PM ? ? ?

## 2021-07-02 LAB — OSMOLALITY: Osmolality: 268 mOsm/kg — ABNORMAL LOW (ref 275–295)

## 2021-07-02 LAB — BASIC METABOLIC PANEL
Anion gap: 5 (ref 5–15)
BUN: 13 mg/dL (ref 8–23)
CO2: 23 mmol/L (ref 22–32)
Calcium: 8.2 mg/dL — ABNORMAL LOW (ref 8.9–10.3)
Chloride: 102 mmol/L (ref 98–111)
Creatinine, Ser: 0.98 mg/dL (ref 0.61–1.24)
GFR, Estimated: >60 mL/min (ref >60)
Glucose, Bld: 109 mg/dL — ABNORMAL HIGH (ref 70–99)
Potassium: 4.2 mmol/L (ref 3.5–5.1)
Sodium: 130 mmol/L — ABNORMAL LOW (ref 135–145)

## 2021-07-02 LAB — CBC
HCT: 25.2 % — ABNORMAL LOW (ref 39.0–52.0)
Hemoglobin: 8.3 g/dL — ABNORMAL LOW (ref 13.0–17.0)
MCH: 27.3 pg (ref 26.0–34.0)
MCHC: 32.9 g/dL (ref 30.0–36.0)
MCV: 82.9 fL (ref 80.0–100.0)
Platelets: 463 10*3/uL — ABNORMAL HIGH (ref 150–400)
RBC: 3.04 MIL/uL — ABNORMAL LOW (ref 4.22–5.81)
RDW: 15.6 % — ABNORMAL HIGH (ref 11.5–15.5)
WBC: 14 10*3/uL — ABNORMAL HIGH (ref 4.0–10.5)
nRBC: 0 % (ref 0.0–0.2)

## 2021-07-02 LAB — HCV RNA QUANT
HCV Quantitative Log: 6.301 log10 IU/mL (ref >1.70)
HCV Quantitative: 2000000 IU/mL (ref >50)

## 2021-07-02 NOTE — Progress Notes (Signed)
? Ian Boyd  OZD:[id number] DOB: May 22, 1950 DOA: 06/25/2021 ?PCP: Curlene Labrum, MD   ? ?Brief Narrative:  ?71 year old with a history of hepatitis C, right eye blindness, and dysphagia who presented to the ED with complaints of nausea vomiting and diarrhea x2 weeks associated with lower abdominal pain bilaterally.   ? ?Consultants:  ?Urology ?ID ?IR ? ?Code Status: FULL CODE ? ?DVT prophylaxis: ?SCDs ? ?Interim Hx: ?Somewhat sedate after receiving pain medications but does awaken to my voice and exam.  Denies any new complaints.  States he feels weak in general.  Reports good appetite.  Denies chest pain or shortness of breath. ? ?Assessment & Plan: ? ?E. coli bacteremia with complicated UTI -severe sepsis ?Complicated by obstructive ureteral calculus -continue antibiotics as per ID -sepsis physiology resolved ? ?Right psoas muscle E. coli abscess ?Noted on CT -drain placed by IR 4/6 -continue antibiotics as per ID -drain care as per IR ? ?Left kidney hydronephrosis and hydroureter ?Due to calculus obstruction at the left vesicoureteral junction -evaluated by urology (Dr. Alyson Ingles) with left percutaneous nephrostomy tube placed 4/6 by IR ? ?Right rotator cuff tear ?X-ray suggest chronic rotator cuff injury -patient confirms symptoms for approximately 3 years -MRI confirms rotator cuff tear -Will need outpatient follow-up for this chronic issue ? ?Acute kidney injury ?Creatinine 2.9 on admission -resolved with volume resuscitation ? ?History of hepatitis C ?Unclear if previously treated -HCV RNA pending per ID ? ?Crack cocaine abuse ?Has been counseled on need to discontinue ? ?Hyponatremia ?Sodium stable -monitor trend ? ?Hypokalemia ?Corrected with supplementation ? ?Thrombocytopenia ?Likely due to significant gram-negative rod infection -resolved ? ? ?Family Communication: Spoke with sister at bedside at length ?Disposition: Plan is for SNF placement -appears medically stable for same when arrangements  can be completed ? ?Objective: ?Blood pressure (!) 145/84, pulse 87, temperature 98.4 ?F (36.9 ?C), temperature source Oral, resp. rate 15, height 6' (1.829 m), weight 57.3 kg, SpO2 97 %. ? ?Intake/Output Summary (Last 24 hours) at 07/02/2021 0957 ?Last data filed at 07/02/2021 0800 ?Gross per 24 hour  ?Intake 350 ml  ?Output 3125 ml  ?Net -2775 ml  ? ?Filed Weights  ? 06/25/21 1255 06/25/21 2322 06/29/21 0617  ?Weight: 56.7 kg 60.1 kg 57.3 kg  ? ? ?Examination: ?General: No acute respiratory distress -lethargic but responsive to voice and questions ?Lungs: Clear to auscultation bilaterally without wheezes or crackles ?Cardiovascular: Regular rate and rhythm without murmur gallop or rub normal S1 and S2 ?Abdomen: Nontender, nondistended, soft, bowel sounds positive, no rebound, no ascites, no appreciable mass -drains in place ?Extremities: No significant cyanosis, clubbing, or edema bilateral lower extremities ? ?CBC: ?Recent Labs  ?Lab 06/25/21 ?1317 06/26/21 ?2595 06/30/21 ?6387 07/01/21 ?5643 07/02/21 ?3295  ?WBC 55.0*   < > 22.6* 16.7* 14.0*  ?NEUTROABS 52.8*  --   --   --   --   ?HGB 11.7*   < > 8.2* 8.8* 8.3*  ?HCT 34.1*   < > 25.1* 26.4* 25.2*  ?MCV 81.4   < > 83.1 83.5 82.9  ?PLT 88*   < > 268 406* 463*  ? < > = values in this interval not displayed.  ? ?Basic Metabolic Panel: ?Recent Labs  ?Lab 06/30/21 ?1884 07/01/21 ?1660 07/02/21 ?6301  ?NA 131* 130* 130*  ?K 4.2 4.3 4.2  ?CL 104 101 102  ?CO2 '22 23 23  '$ ?GLUCOSE 103* 110* 109*  ?BUN '18 15 13  '$ ?CREATININE 1.09 0.97 0.98  ?CALCIUM 8.2* 8.3*  8.2*  ? ?GFR: ?Estimated Creatinine Clearance: 54.4 mL/min (by C-G formula based on SCr of 0.98 mg/dL). ? ?Liver Function Tests: ?Recent Labs  ?Lab 06/25/21 ?1317  ?AST 47*  ?ALT 24  ?ALKPHOS 192*  ?BILITOT 5.1*  ?PROT 6.7  ?ALBUMIN 2.2*  ? ? ?Scheduled Meds: ? brimonidine  1 drop Left Eye BID  ? Chlorhexidine Gluconate Cloth  6 each Topical Daily  ? dorzolamide-timolol  1 drop Left Eye BID  ? latanoprost  1 drop Left Eye  QHS  ? sodium chloride flush  5 mL Intracatheter Q8H  ? tamsulosin  0.4 mg Oral QHS  ? ?Continuous Infusions: ?  ceFAZolin (ANCEF) IV 2 g (07/02/21 0515)  ? ? ? LOS: 7 days  ? ?Cherene Altes, MD ?[facility] ?Office  [phone] ?Pager - Text Page per Shea Evans ? ?If 7PM-7AM, please contact night-coverage per Amion ?07/02/2021, 9:57 AM ? ? ? ?

## 2021-07-02 NOTE — Progress Notes (Signed)
Occupational Therapy Treatment ?Patient Details ?Name: Ian Boyd ?MRN: [id number] ?DOB: 01-Apr-1950 ?Today's Date: 07/02/2021 ? ? ?History of present illness 71 yo male admitted with N/V/D inability to move R arm, difficulty walking and expressive deficts. 4/5 admit due to sepsis CT abdomen pelvis shows probable psoas abscess v/s psoas hematoma with superimposed infection (+) crack cocaine use 4/6 Left percutaneous nephrostomy 2. Right psoas abscess drain. MRI of brain with no acute intracranial abnormality. PMH blind R eye , drug use, smoker ?  ?OT comments ? Pt seen in conjunction with PT to maximize pt activity tolerance and participation. Pt continues to present with increased pain, decreased activity tolerance and generalized deconditioning. Pt currently requires MIN A +2 for functional mobility with RW, and MOD A for UB ADLs. Pt was able to ambulate in room with RW and MIN A +2 for safety with pt agreeable to sit OOB in recliner. Pt would continue to benefit from skilled occupational therapy while admitted and after d/c to address the below listed limitations in order to improve overall functional mobility and facilitate independence with BADL participation. DC plan remains appropriate, will follow acutely per POC.  ?  ? ?Recommendations for follow up therapy are one component of a multi-disciplinary discharge planning process, led by the attending physician.  Recommendations may be updated based on patient status, additional functional criteria and insurance authorization. ?   ?Follow Up Recommendations ? Other (comment) (Skilled nursing-short term rehab (<3 hours/day) vs Acute Inpatient Rehab pending available home support)  ?  ?Assistance Recommended at Discharge Intermittent Supervision/Assistance  ?Patient can return home with the following ? Assistance with cooking/housework;Direct supervision/assist for medications management;Direct supervision/assist for financial management;Assist for  transportation;Help with stairs or ramp for entrance ?  ?Equipment Recommendations ? Wheelchair (measurements OT);Wheelchair cushion (measurements OT);Hospital bed  ?  ?Recommendations for Other Services   ? ?  ?Precautions / Restrictions Precautions ?Precautions: Fall ?Precaution Comments: R groin JP drain; L abdomen pleural drain; mass R neck/shoulder region ?Restrictions ?Weight Bearing Restrictions: No  ? ? ?  ? ?Mobility Bed Mobility ?Overal bed mobility: Needs Assistance ?Bed Mobility: Supine to Sit ?  ?  ?Supine to sit: Min assist, +2 for physical assistance ?  ?  ?General bed mobility comments: pt advancing BLEs to EOB but needed MIN A +2 to elevate trunk into sitting, step by step cues needed for motor planning with compensatory strategies utilized d/t blindness in R eye ?  ? ?Transfers ?Overall transfer level: Needs assistance ?Equipment used: Rolling walker (2 wheels) ?Transfers: Sit to/from Stand ?Sit to Stand: Min assist, +2 safety/equipment ?  ?  ?  ?  ?  ?General transfer comment: cues needed for hand placement, MIN A +2 to rise from EOB with intitially steadying assist needed ?  ?  ?Balance Overall balance assessment: Needs assistance ?Sitting-balance support: Feet supported, No upper extremity supported ?Sitting balance-Leahy Scale: Fair ?  ?  ?Standing balance support: Bilateral upper extremity supported, During functional activity ?Standing balance-Leahy Scale: Fair ?  ?  ?  ?  ?  ?  ?  ?  ?  ?  ?  ?  ?   ? ?ADL either performed or assessed with clinical judgement  ? ?ADL Overall ADL's : Needs assistance/impaired ?  ?  ?  ?  ?  ?  ?  ?  ?Upper Body Dressing : Moderate assistance;Sitting ?Upper Body Dressing Details (indicate cue type and reason): to don new gown d/t increased pain in RUE ?  ?  ?  Toilet Transfer: +2 for safety/equipment;Minimal assistance;Ambulation;Rolling walker (2 wheels) ?Toilet Transfer Details (indicate cue type and reason): simulated via functional mobility in room ?  ?  ?  ?   ?Functional mobility during ADLs: Minimal assistance;+2 for safety/equipment;Rolling walker (2 wheels) ?General ADL Comments: pt continues to present with increased pain, decreased activity tolerance and cognitive deficits ?  ? ?Extremity/Trunk Assessment Upper Extremity Assessment ?Upper Extremity Assessment: RUE deficits/detail ?RUE Deficits / Details: AROM impaired, weakness, reports rotator cuff injury ?RUE: Shoulder pain at rest ?  ?Lower Extremity Assessment ?Lower Extremity Assessment: Defer to PT evaluation ?  ?  ?  ? ?Vision Baseline Vision/History: 1 Wears glasses (doesn't have them with him) ?Patient Visual Report: No change from baseline ?Additional Comments: blind in R eye at baseline ?  ?Perception Perception ?Perception: Not tested ?  ?Praxis Praxis ?Praxis: Not tested ?  ? ?Cognition Arousal/Alertness: Awake/alert ?Behavior During Therapy: Flat affect ?Overall Cognitive Status: Impaired/Different from baseline ?Area of Impairment: Attention, Problem solving ?  ?  ?  ?  ?  ?  ?  ?  ?  ?Current Attention Level: Sustained ?  ?  ?  ?  ?Problem Solving: Slow processing, Decreased initiation ?General Comments: increased time noted to process with pt needing repetition of cues during functional mobility/ADLs ?  ?  ?   ?Exercises   ? ?  ?Shoulder Instructions   ? ? ?  ?General Comments pt on RA during session VSS HR max 108 bpm  ? ? ?Pertinent Vitals/ Pain       Pain Assessment ?Pain Assessment: Faces ?Faces Pain Scale: Hurts little more ?Pain Location: R shoulder ?Pain Descriptors / Indicators: Discomfort, Grimacing, Guarding ?Pain Intervention(s): Limited activity within patient's tolerance, Monitored during session, Repositioned ? ?Home Living   ?  ?  ?  ?  ?  ?  ?  ?  ?  ?  ?  ?  ?  ?  ?  ?  ?  ?  ? ?  ?Prior Functioning/Environment    ?  ?  ?  ?   ? ?Frequency ? Min 2X/week  ? ? ? ? ?  ?Progress Toward Goals ? ?OT Goals(current goals can now be found in the care plan section) ? Progress towards OT  goals: Progressing toward goals ? ?Acute Rehab OT Goals ?Patient Stated Goal: none stated ?Time For Goal Achievement: 07/11/21 ?Potential to Achieve Goals: Fair  ?Plan Discharge plan remains appropriate;Frequency remains appropriate   ? ?Co-evaluation ? ? ?   ?  ?  ?  ?  ? ?  ?AM-PAC OT "6 Clicks" Daily Activity     ?Outcome Measure ? ? Help from another person eating meals?: None ?Help from another person taking care of personal grooming?: A Little ?Help from another person toileting, which includes using toliet, bedpan, or urinal?: A Little ?Help from another person bathing (including washing, rinsing, drying)?: A Lot ?Help from another person to put on and taking off regular upper body clothing?: A Little ?Help from another person to put on and taking off regular lower body clothing?: A Lot ?6 Click Score: 17 ? ?  ?End of Session Equipment Utilized During Treatment: Gait belt;Rolling walker (2 wheels) ? ?OT Visit Diagnosis: Unsteadiness on feet (R26.81);Muscle weakness (generalized) (M62.81) ?  ?Activity Tolerance Patient tolerated treatment well ?  ?Patient Left in chair;with call bell/phone within reach;with chair alarm set ?  ?Nurse Communication Mobility status ?  ? ?   ? ?Time: 3299-2426 ?OT Time  Calculation (min): 27 min ? ?Charges: OT General Charges ?$OT Visit: 1 Visit ?OT Treatments ?$Self Care/Home Management : 8-22 mins ? ?Corinne Ports K., COTA/L ?Acute Rehabilitation Services ?[phone] ? ?Precious Haws ?07/02/2021, 1:20 PM ?

## 2021-07-02 NOTE — Progress Notes (Incomplete)
? ? ?Referring Physician(s): ?Eulogio Bear, DO ? ?Supervising Physician: Markus Daft ? ?Patient Status:  [facility] - In-pt ? ?Chief Complaint: ? ?Left percutaneous nephrostomy and Right psoas abscess drain placed by Dr. Dwaine Gale 06/26/21 ? ?Subjective: ? ?*** ? ? ?Allergies: ?Patient has no known allergies. ? ?Medications: ?Prior to Admission medications   ?Medication Sig Start Date End Date Taking? Authorizing Provider  ?Albuterol Sulfate, sensor, (PROAIR DIGIHALER) 108 (90 Base) MCG/ACT AEPB Inhale 1 puff into the lungs daily as needed (shortness of breath).   Yes [provider]  ?brimonidine (ALPHAGAN) 0.2 % ophthalmic solution SMARTSIG:In Eye(s) 06/22/21  Yes [provider]  ?dorzolamide-timolol (COSOPT) 22.3-6.8 MG/ML ophthalmic solution Place 1 drop into the left eye 2 (two) times daily. 05/05/21  Yes [provider]  ?ibuprofen (ADVIL) 600 MG tablet Take 600 mg by mouth every 6 (six) hours as needed. 06/19/21  Yes [provider]  ?Multiple Vitamin (MULTIVITAMIN) tablet Take 1 tablet by mouth daily.   Yes [provider]  ?Phenyleph-Doxylamine-DM-APAP (NYQUIL SEVERE COLD/FLU PO) Take 1 Dose by mouth at bedtime as needed (congestion).   Yes [provider]  ?RHOPRESSA 0.02 % SOLN Place 1 drop into the left eye at bedtime. 05/05/21  Yes [provider]  ?tamsulosin (FLOMAX) 0.4 MG CAPS capsule Take 0.4 mg by mouth at bedtime. 04/17/21  Yes [provider]  ?tiotropium (SPIRIVA) 18 MCG inhalation capsule Place 18 mcg into inhaler and inhale daily.   Yes [provider]  ?VYZULTA 0.024 % SOLN Place 1 drop into the left eye at bedtime. 05/05/21  Yes [provider]  ?pantoprazole (PROTONIX) 40 MG tablet TAKE 1 TABLET (40 MG TOTAL) BY MOUTH 2 (TWO) TIMES DAILY BEFORE A MEAL. ?Patient not taking: Reported on 06/25/2021 08/04/18   Rogene Houston, MD  ?polyethylene glycol-electrolytes (TRILYTE) 420 g solution Take 4,000 mLs by mouth as  directed. ?Patient not taking: Reported on 06/25/2021 08/29/20   Harvel Quale, MD  ? ? ? ?Vital Signs: ?BP (!) 145/84 (BP Location: Left Arm)   Pulse 87   Temp 98.4 ?F (36.9 ?C) (Oral)   Resp 15   Ht 6' (1.829 m)   Wt 126 lb 5.2 oz (57.3 kg)   SpO2 97%   BMI 17.13 kg/m?  ? ?Physical Exam ?Constitutional:   ?   Appearance: He is ill-appearing.  ?HENT:  ?   Head: Normocephalic and atraumatic.  ?Eyes:  ?   Extraocular Movements: Extraocular movements intact.  ?   Pupils: Pupils are equal, round, and reactive to light.  ?Pulmonary:  ?   Effort: Pulmonary effort is normal. No respiratory distress.  ?Abdominal:  ?   Comments: R hip drain intact. ~25cc serosanguinous OP in JP drain. Dressing C/D/I with sutures intact. Flushes easily.  ? ?L nephrostomy drain intact. ~ 50cc red-colored urine in gravity bag. Site C/D/I, sutures in place.   ?Skin: ?   General: Skin is warm and dry.  ?Neurological:  ?   Mental Status: He is alert and oriented to person, place, and time.  ?Psychiatric:     ?   Mood and Affect: Mood normal.     ?   Behavior: Behavior normal.     ?   Thought Content: Thought content normal.     ?   Judgment: Judgment normal.  ? ? ?Imaging: ?No results found. ? ?Labs: ? ?CBC: ?Recent Labs  ?  06/28/21 ?0135 06/30/21 ?4580 07/01/21 ?9983 07/02/21 ?3825  ?WBC 18.9* 22.6* 16.7*  14.0*  ?HGB 9.7* 8.2* 8.8* 8.3*  ?HCT 28.3* 25.1* 26.4* 25.2*  ?PLT 87* 268 406* 463*  ? ? ? ?COAGS: ?Recent Labs  ?  06/26/21 ?0913  ?INR 1.3*  ? ? ? ?BMP: ?Recent Labs  ?  06/28/21 ?0135 06/30/21 ?6701 07/01/21 ?4103 07/02/21 ?0131  ?NA 134* 131* 130* 130*  ?K 3.9 4.2 4.3 4.2  ?CL 109 104 101 102  ?CO2 20* '22 23 23  '$ ?GLUCOSE 100* 103* 110* 109*  ?BUN 32* '18 15 13  '$ ?CALCIUM 7.9* 8.2* 8.3* 8.2*  ?CREATININE 1.30* 1.09 0.97 0.98  ?GFRNONAA 58* >60 >60 >60  ? ? ? ?LIVER FUNCTION TESTS: ?Recent Labs  ?  06/25/21 ?1317  ?BILITOT 5.1*  ?AST 47*  ?ALT 24  ?ALKPHOS 192*  ?PROT 6.7  ?ALBUMIN 2.2*  ? ? ? ?Assessment and Plan: ?71 yo male  with left hydronephrosis secondary to calculus obstruction at the LEFT vesicoureteral junction and right psoas abscess, s/p left PCN and right psoas drain placement by Dr. Dwaine Gale on 06/26/21.  ? ?WBC trending down, afebrile  ?Cx from psoas abscess E coli  ? ? ?Drain Location: RLQ ?Size: Fr size: 10 Fr ?Date of placement: 06/26/21  ?Currently to: Drain collection device: suction bulb ?24 hour output:  ?Output by Drain (mL) 06/30/21 0701 - 06/30/21 1900 06/30/21 1901 - 07/01/21 0700 07/01/21 0701 - 07/01/21 1900 07/01/21 1901 - 07/02/21 0700 07/02/21 0701 - 07/02/21 1012  ?Closed System Drain Right Hip Bulb (JP) 10 Fr. 10 15     ? ? ?Interval imaging/drain manipulation:  ?None  ? ?Current examination: ?Flushes/aspirates easily.  ?Insertion site unremarkable. ?Suture and stat lock in place. ?Dressed appropriately.  ? ?Plan: ?L PCN - outputing normal urine with few clots - further management per urology. Routine exchange with IR in 8-12 weeks. ? ?2. RLQ drain-  ?Continue TID flushes with 5 cc NS. ?Record output Q shift. ?Dressing changes QD or PRN if soiled.  ?Call IR APP or on call IR MD if difficulty flushing or sudden change in drain output.  ?Repeat imaging/possible drain injection once output < 10 mL/QD (excluding flush material.) ? ?Discharge planning: ?Please contact IR APP or on call IR MD prior to patient d/c to ensure appropriate follow up plans are in place. Typically patient will follow up with IR clinic 10-14 days post d/c for repeat imaging/possible drain injection. IR scheduler will contact patient with date/time of appointment. Patient will need to flush drain QD with 5 cc NS, record output QD, dressing changes every 2-3 days or earlier if soiled.  ? ?IR will continue to follow - please call with questions or concerns. ? ? ?Electronically Signed: ?Tera Mater, PA-C ?07/02/2021, 10:09 AM ? ? ?I spent a total of 15 Minutes at the the patient's bedside AND on the patient's hospital floor or unit, greater than 50%  of which was counseling/coordinating care for L percutaneous nephrostomy tube and R psoas abscess drain placed 06/26/21 by Dr. Dwaine Gale.  ? ? ? ?  ?

## 2021-07-02 NOTE — Plan of Care (Signed)
  Problem: Education: Goal: Knowledge of General Education information will improve Description: Including pain rating scale, medication(s)/side effects and non-pharmacologic comfort measures Outcome: Progressing   Problem: Health Behavior/Discharge Planning: Goal: Ability to manage health-related needs will improve Outcome: Progressing   Problem: Clinical Measurements: Goal: Ability to maintain clinical measurements within normal limits will improve Outcome: Progressing Goal: Will remain free from infection Outcome: Progressing Goal: Diagnostic test results will improve Outcome: Progressing Goal: Respiratory complications will improve Outcome: Progressing Goal: Cardiovascular complication will be avoided Outcome: Progressing   Problem: Coping: Goal: Level of anxiety will decrease Outcome: Progressing   Problem: Elimination: Goal: Will not experience complications related to bowel motility Outcome: Progressing Goal: Will not experience complications related to urinary retention Outcome: Progressing   Problem: Safety: Goal: Ability to remain free from injury will improve Outcome: Progressing   Problem: Skin Integrity: Goal: Risk for impaired skin integrity will decrease Outcome: Progressing   

## 2021-07-02 NOTE — Progress Notes (Signed)
? ? ?Referring Physician(s): ?Dr. Eliseo Squires ? ?Supervising Physician: Markus Daft ? ?Patient Status:  [facility] - In-pt ? ?Chief Complaint: ?Psoas abscess ?Hydronephrosis, left kidney ? ?Subjective: ?Lying in bed, sleeping. Arouses to voice, but falls back asleep.   ?Bilateral drains intact: L PCN, R psoas drain ?Afebrile, WBC with slow steady improvement.  ? ?Allergies: ?Patient has no known allergies. ? ?Medications: ?Prior to Admission medications   ?Medication Sig Start Date End Date Taking? Authorizing Provider  ?Albuterol Sulfate, sensor, (PROAIR DIGIHALER) 108 (90 Base) MCG/ACT AEPB Inhale 1 puff into the lungs daily as needed (shortness of breath).   Yes [provider]  ?brimonidine (ALPHAGAN) 0.2 % ophthalmic solution SMARTSIG:In Eye(s) 06/22/21  Yes [provider]  ?dorzolamide-timolol (COSOPT) 22.3-6.8 MG/ML ophthalmic solution Place 1 drop into the left eye 2 (two) times daily. 05/05/21  Yes [provider]  ?ibuprofen (ADVIL) 600 MG tablet Take 600 mg by mouth every 6 (six) hours as needed. 06/19/21  Yes [provider]  ?Multiple Vitamin (MULTIVITAMIN) tablet Take 1 tablet by mouth daily.   Yes [provider]  ?Phenyleph-Doxylamine-DM-APAP (NYQUIL SEVERE COLD/FLU PO) Take 1 Dose by mouth at bedtime as needed (congestion).   Yes [provider]  ?RHOPRESSA 0.02 % SOLN Place 1 drop into the left eye at bedtime. 05/05/21  Yes [provider]  ?tamsulosin (FLOMAX) 0.4 MG CAPS capsule Take 0.4 mg by mouth at bedtime. 04/17/21  Yes [provider]  ?tiotropium (SPIRIVA) 18 MCG inhalation capsule Place 18 mcg into inhaler and inhale daily.   Yes [provider]  ?VYZULTA 0.024 % SOLN Place 1 drop into the left eye at bedtime. 05/05/21  Yes [provider]  ?pantoprazole (PROTONIX) 40 MG tablet TAKE 1 TABLET (40 MG TOTAL) BY MOUTH 2 (TWO) TIMES DAILY BEFORE A MEAL. ?Patient not taking: Reported on 06/25/2021 08/04/18   Rogene Houston, MD   ?polyethylene glycol-electrolytes (TRILYTE) 420 g solution Take 4,000 mLs by mouth as directed. ?Patient not taking: Reported on 06/25/2021 08/29/20   Harvel Quale, MD  ? ? ? ?Vital Signs: ?BP (!) 145/84 (BP Location: Left Arm)   Pulse 87   Temp 98.4 ?F (36.9 ?C) (Oral)   Resp 15   Ht 6' (1.829 m)   Wt 126 lb 5.2 oz (57.3 kg)   SpO2 97%   BMI 17.13 kg/m?  ? ?Physical Exam ?NAD, sleeping. ?Abdomen: right psoas drain intact. Thick, bloody fluid in bulb ?Left PCN in place, cloudy urine with minimal debris. ? ?Imaging: ?No results found. ? ?Labs: ? ?CBC: ?Recent Labs  ?  06/28/21 ?0135 06/30/21 ?2297 07/01/21 ?9892 07/02/21 ?1194  ?WBC 18.9* 22.6* 16.7* 14.0*  ?HGB 9.7* 8.2* 8.8* 8.3*  ?HCT 28.3* 25.1* 26.4* 25.2*  ?PLT 87* 268 406* 463*  ? ? ?COAGS: ?Recent Labs  ?  06/26/21 ?0913  ?INR 1.3*  ? ? ?BMP: ?Recent Labs  ?  06/28/21 ?0135 06/30/21 ?1740 07/01/21 ?8144 07/02/21 ?8185  ?NA 134* 131* 130* 130*  ?K 3.9 4.2 4.3 4.2  ?CL 109 104 101 102  ?CO2 20* '22 23 23  '$ ?GLUCOSE 100* 103* 110* 109*  ?BUN 32* '18 15 13  '$ ?CALCIUM 7.9* 8.2* 8.3* 8.2*  ?CREATININE 1.30* 1.09 0.97 0.98  ?GFRNONAA 58* >60 >60 >60  ? ? ?LIVER FUNCTION TESTS: ?Recent Labs  ?  06/25/21 ?1317  ?BILITOT 5.1*  ?AST 47*  ?ALT 24  ?ALKPHOS 192*  ?PROT 6.7  ?ALBUMIN 2.2*  ? ? ?Assessment and Plan: ?Psoas  abscess s/p right percutaneous drain placement 06/26/21 ?Drain remains in place.  ?Culture finalized yesterday with E coli.  ?Output remains significant, 25 mL overnight. Bloody, thick.  ?WBC improving, down to 14.  ?Remains afebrile. ?Maintain output documentation.  ?Continue TID flushing.  ? ?Left hydronephrosis s/p left percutaneous nephrostomy tube placement 06/26/21 ?PCN remains in place with good UOP. ?Urine cloudy, some debris. ?Scr improved, 0.98 ?Continue current management.  ? ? ?Electronically Signed: ?Docia Barrier, PA ?07/02/2021, 1:54 PM ? ? ?I spent a total of 15 Minutes at the the patient's bedside AND on the patient's  hospital floor or unit, greater than 50% of which was counseling/coordinating care for psoas abscess, hydronephrosis. ? ? ? ? ? ?

## 2021-07-02 NOTE — Progress Notes (Signed)
Physical Therapy Treatment ?Patient Details ?Name: Ian Boyd ?MRN: [id number] ?DOB: [birth year] ?Today's Date: 07/02/2021 ? ? ?History of Present Illness 71 yo male admitted with N/V/D inability to move R arm, difficulty walking and expressive deficts. 4/5 admit due to sepsis CT abdomen pelvis shows probable psoas abscess v/s psoas hematoma with superimposed infection (+) crack cocaine use 4/6. Left percutaneous nephrostomy 2. Right psoas abscess JP drain. MRI of brain with no acute intracranial abnormality. PMH: blind R eye, drug use, smoker ? ?  ?PT Comments  ? ? Pt received in supine, pleasantly agreeable to therapy session and with improved activity tolerance this date compared with previous session. Pt able to progress gait distance with RW support and minA (+2 safety) and needing consistent +2 minA for bed mobility and transfers with mod cues for body mechanics and improved technique. Pt continues to benefit from PT services to progress toward functional mobility goals. VSS on RA.   ?Recommendations for follow up therapy are one component of a multi-disciplinary discharge planning process, led by the attending physician.  Recommendations may be updated based on patient status, additional functional criteria and insurance authorization. ? ?Follow Up Recommendations ? Skilled nursing-short term rehab (<3 hours/day) ?  ?  ?Assistance Recommended at Discharge Frequent or constant Supervision/Assistance  ?Patient can return home with the following Two people to help with walking and/or transfers;A lot of help with bathing/dressing/bathroom;Assistance with cooking/housework;Direct supervision/assist for medications management;Direct supervision/assist for financial management;Assist for transportation;Help with stairs or ramp for entrance ?  ?Equipment Recommendations ? Rolling walker (2 wheels);BSC/3in1  ?  ?Recommendations for Other Services   ? ? ?  ?Precautions / Restrictions Precautions ?Precautions:  Fall ?Precaution Comments: R groin JP drain; L abdomen pleural drain; mass R neck/shoulder region ?Restrictions ?Weight Bearing Restrictions: No  ?  ? ?Mobility ? Bed Mobility ?Overal bed mobility: Needs Assistance ?Bed Mobility: Supine to Sit ?  ?  ?Supine to sit: Min assist, +2 for physical assistance ?  ?  ?General bed mobility comments: pt advancing BLEs to EOB but needed MIN A +2 to elevate trunk into sitting, step by step cues needed for motor planning with compensatory strategies utilized d/t blindness in R eye ?  ? ?Transfers ?Overall transfer level: Needs assistance ?Equipment used: Rolling walker (2 wheels) ?Transfers: Sit to/from Stand ?Sit to Stand: Min assist, +2 safety/equipment ?  ?  ?  ?  ?  ?General transfer comment: cues needed for hand placement, MIN A +2 to rise from EOB with intitially steadying assist needed ?  ? ?Ambulation/Gait ?Ambulation/Gait assistance: Min assist, +2 safety/equipment ?Gait Distance (Feet): 25 Feet ?Assistive device: Rolling walker (2 wheels) ?Gait Pattern/deviations: Step-through pattern, Decreased stride length, Shuffle, Trunk flexed, Narrow base of support ?  ?  ?  ?General Gait Details: Pt with slow, shuffling steps from far side of bed to sink and back to recliner near windows, narrow BOS and forward head/downward gaze, briefly improved forward gaze with cues ? ? ?  ?Balance Overall balance assessment: Needs assistance ?Sitting-balance support: Feet supported, No upper extremity supported ?Sitting balance-Leahy Scale: Fair ?  ?  ?Standing balance support: Bilateral upper extremity supported, During functional activity ?Standing balance-Leahy Scale: Fair ?Standing balance comment: Reliant on bil UE support of RW ?  ?  ? ?  ?Cognition Arousal/Alertness: Awake/alert ?Behavior During Therapy: [facility] for tasks assessed/performed ?Overall Cognitive Status: Impaired/Different from baseline ?Area of Impairment: Attention, Problem solving ?  ?  ?  ?  ?  ?  ?  ?  ?  ?  Current  Attention Level: Sustained ?  ?  ?  ?  ?Problem Solving: Slow processing, Decreased initiation ?General Comments: increased time noted to process with pt needing repetition of cues during functional mobility/ADLs ?  ?  ? ?  ?Exercises   ? ?  ?General Comments General comments (skin integrity, edema, etc.): HR max 108 bpm, VSS on RA ?  ?  ? ?Pertinent Vitals/Pain Pain Assessment ?Faces Pain Scale: No hurt ?Pain Location: R shoulder ?Pain Descriptors / Indicators: Discomfort, Grimacing, Guarding ?Pain Intervention(s): Monitored during session, Repositioned  ? ? ? ?PT Goals (current goals can now be found in the care plan section) Acute Rehab PT Goals ?Patient Stated Goal: to move better, to get OOB. ?PT Goal Formulation: With patient ?Time For Goal Achievement: 07/11/21 ?Progress towards PT goals: Progressing toward goals ? ?  ?Frequency ? ? ? Min 2X/week ? ? ? ?  ?PT Plan Current plan remains appropriate  ? ? ?Co-evaluation PT/OT/SLP Co-Evaluation/Treatment: Yes ?Reason for Co-Treatment: For patient/therapist safety;To address functional/ADL transfers ?PT goals addressed during session: Mobility/safety with mobility;Balance;Proper use of DME;Strengthening/ROM ?  ?  ? ?  ?AM-PAC PT "6 Clicks" Mobility   ?Outcome Measure ? Help needed turning from your back to your side while in a flat bed without using bedrails?: A Little ?Help needed moving from lying on your back to sitting on the side of a flat bed without using bedrails?: A Lot ?Help needed moving to and from a bed to a chair (including a wheelchair)?: A Lot ?Help needed standing up from a chair using your arms (e.g., wheelchair or bedside chair)?: A Lot ?Help needed to walk in hospital room?: A Lot ?Help needed climbing 3-5 steps with a railing? : Total ?6 Click Score: 12 ? ?  ?End of Session Equipment Utilized During Treatment: Gait belt ?Activity Tolerance: Patient tolerated treatment well ?Patient left: in chair;with call bell/phone within reach;with chair  alarm set ?Nurse Communication: Mobility status;Other (comment) (needs +1 assist for pivotal tf, +2 for safety amb to bathroom with RW) ?PT Visit Diagnosis: Unsteadiness on feet (R26.81);Other abnormalities of gait and mobility (R26.89);Muscle weakness (generalized) (M62.81);History of falling (Z91.81);Difficulty in walking, not elsewhere classified (R26.2);Pain ?Pain - Right/Left: Right ?Pain - part of body: Shoulder ?  ? ? ?Time: 6045-4098 ?PT Time Calculation (min) (ACUTE ONLY): 25 min ? ?Charges:  $Gait Training: 8-22 mins          ?          ? ?Trustin Chapa P., PTA ?Acute Rehabilitation Services ?Secure Chat Preferred 9a-5:30pm ?Office: [phone]  ? ? ?Jennetta Flood M Rozena Fierro ?07/02/2021, 1:59 PM ? ?

## 2021-07-02 NOTE — TOC Progression Note (Signed)
Transition of Care (TOC) - Progression Note  ? ? ?Patient Details  ?Name: Ian Boyd ?MRN: [id number] ?Date of Birth: [birth year] ? ?Transition of Care (TOC) CM/SW Contact  ?Vinie Sill, LCSW ?Phone Number: ?07/02/2021, 5:41 PM ? ?Clinical Narrative:    ? ?Spoke with patient' sister proved updated bed offer in [place]- she declined and preference is [facility].  ? ?Sent [facility] to confirmed bed availability- waiting on response ? ?[employer]ed Manufacturing engineer # [phone] ? ?TOC will continue to follow and assist with discharge planning.  ? ?Thurmond Butts, MSW, LCSW ?Clinical Social Worker ? ? ? ?Expected Discharge Plan: [place] ?Barriers to Discharge: Ship broker, Continued Medical Work up, SNF Pending bed offer ? ?Expected Discharge Plan and Services ?Expected Discharge Plan: [place] ?In-house Referral: Clinical Social Work ?  ?  ?Living arrangements for the past 2 months: [place] ?                ?  ?  ?  ?  ?  ?  ?  ?  ?  ?  ? ? ?Social Determinants of Health (SDOH) Interventions ?  ? ?Readmission Risk Interventions ?   ? View : No data to display.  ?  ?  ?  ? ? ?

## 2021-07-02 NOTE — Progress Notes (Signed)
Speech Language Pathology Treatment: Cognitive-Linquistic  ?Patient Details ?Name: Ian Boyd ?MRN: [id number] ?DOB: 12-05-1950 ?Today's Date: 07/02/2021 ?Time: 8676-7209 ?SLP Time Calculation (min) (ACUTE ONLY): 25 min ? ?Assessment / Plan / Recommendation ?Clinical Impression ? Pt seen for cognitive treatment with deficits noted in prior session re: orientation, problem solving and awareness, attention and memory storage.  Pt oriented x4 this session with increased awareness of defiicts as he stated he "had a torn rotator cuff that needed surgery/kidneystone with a drain" and "needs 24 hr supervision" without prompting given by SLP.  Pt maintained attention to task with simple problem solving tasks with improved effort and accuracy for functional situations (ie: aware of catheter for bathroom, call bell for needs);  He also recalled he needed to attend rehab and have 24 hr care before and possibly if returning home).  He stated his sister is helping with financial concerns at current time; pt pleasant and cooperative for session.  Continued ST recommended to further assess cognitive needs while in acute setting with f/u at next venue of care prn. ?  ?HPI HPI: 71 yo male admitted with N/V/D inability to move R arm, difficulty walking and expressive deficits. 4/5 admit due to sepsis CT abdomen pelvis shows probable psoas abscess v/s psoas hematoma with superimposed infection (+) crack cocaine use 4/6 Left percutaneous nephrostomy 2. Right psoas abscess drain. MRI of brain with no acute intracranial abnormality. PMH blind R eye , drug use, smoker; f/u for cognitive tx; r/o baseline dementia. ?  ?   ?SLP Plan ? Continue with current plan of care ? ?  ?  ?Recommendations for follow up therapy are one component of a multi-disciplinary discharge planning process, led by the attending physician.  Recommendations may be updated based on patient status, additional functional criteria and insurance authorization. ?   ? ?Recommendations  ?   ?   ?    ?   ? ? ? ? Follow Up Recommendations: Acute inpatient rehab (3hours/day) ?Assistance recommended at discharge: Frequent or constant Supervision/Assistance ?SLP Visit Diagnosis: Cognitive communication deficit (R41.841) ?Plan: Continue with current plan of care ? ? ? ? ?  ?  ? ? ?Elvina Sidle, M.S., CCC-SLP ? ?07/02/2021, 1:28 PM ?

## 2021-07-03 LAB — CBC
HCT: 27.1 % — ABNORMAL LOW (ref 39.0–52.0)
Hemoglobin: 8.9 g/dL — ABNORMAL LOW (ref 13.0–17.0)
MCH: 27.6 pg (ref 26.0–34.0)
MCHC: 32.8 g/dL (ref 30.0–36.0)
MCV: 83.9 fL (ref 80.0–100.0)
Platelets: 586 10*3/uL — ABNORMAL HIGH (ref 150–400)
RBC: 3.23 MIL/uL — ABNORMAL LOW (ref 4.22–5.81)
RDW: 15.5 % (ref 11.5–15.5)
WBC: 15.1 10*3/uL — ABNORMAL HIGH (ref 4.0–10.5)
nRBC: 0 % (ref 0.0–0.2)

## 2021-07-03 LAB — COMPREHENSIVE METABOLIC PANEL
ALT: 19 U/L (ref 0–44)
AST: 52 U/L — ABNORMAL HIGH (ref 15–41)
Albumin: 1.8 g/dL — ABNORMAL LOW (ref 3.5–5.0)
Alkaline Phosphatase: 78 U/L (ref 38–126)
Anion gap: 6 (ref 5–15)
BUN: 11 mg/dL (ref 8–23)
CO2: 23 mmol/L (ref 22–32)
Calcium: 8.5 mg/dL — ABNORMAL LOW (ref 8.9–10.3)
Chloride: 100 mmol/L (ref 98–111)
Creatinine, Ser: 0.99 mg/dL (ref 0.61–1.24)
GFR, Estimated: >60 mL/min (ref >60)
Glucose, Bld: 97 mg/dL (ref 70–99)
Potassium: 4.3 mmol/L (ref 3.5–5.1)
Sodium: 129 mmol/L — ABNORMAL LOW (ref 135–145)
Total Bilirubin: 0.9 mg/dL (ref 0.3–1.2)
Total Protein: 6 g/dL — ABNORMAL LOW (ref 6.5–8.1)

## 2021-07-03 LAB — VITAMIN B12: Vitamin B-12: 2698 pg/mL — ABNORMAL HIGH (ref 180–914)

## 2021-07-03 LAB — FOLATE: Folate: 14.3 ng/mL (ref >5.9)

## 2021-07-03 MED ORDER — BETHANECHOL CHLORIDE 10 MG PO TABS
10.0000 mg | ORAL_TABLET | Freq: Three times a day (TID) | ORAL | Status: DC
  Administered 2021-07-03 – 2021-07-04 (×3): 10 mg via ORAL
  Filled 2021-07-03 (×3): qty 1

## 2021-07-03 MED ORDER — CEFADROXIL 500 MG PO CAPS: 1000.0000 mg | ORAL_CAPSULE | Freq: Two times a day (BID) | ORAL | 0 refills | Status: DC

## 2021-07-03 MED ORDER — CEFADROXIL 500 MG PO CAPS
1000.0000 mg | ORAL_CAPSULE | Freq: Two times a day (BID) | ORAL | Status: DC
  Administered 2021-07-03 – 2021-07-04 (×3): 1000 mg via ORAL
  Filled 2021-07-03 (×4): qty 2

## 2021-07-03 NOTE — Progress Notes (Signed)
Mobility Specialist Progress Note ? ? 07/03/21 1726  ?Mobility  ?Activity Transferred from chair to bed  ?Level of Assistance Minimal assist, patient does 75% or more  ?Assistive Device Front wheel walker  ?Distance Ambulated (ft) 4 ft  ?Activity Response Tolerated well  ?$Mobility charge 1 Mobility  ? ?Received pt in chair requesting to get back to bed. Tx'd w/o fault and w/ min A. Pt limited by RUE d/t rotator cuff pain. Requiring mod cues for foot and hand placement for safety and sequencing. Left in bed w/ call bell in reach and bed alarm on.  ? ?Holland Falling ?Mobility Specialist ?Phone Number [phone] ? ?

## 2021-07-03 NOTE — TOC Progression Note (Signed)
Transition of Care (TOC) - Progression Note  ? ? ?Patient Details  ?Name: Ian Boyd ?MRN: [id number] ?Date of Birth: [birth year] ? ?Transition of Care (TOC) CM/SW Contact  ?Coralee Pesa, LCSWA ?Phone Number: ?07/03/2021, 4:12 PM ? ?Clinical Narrative:    ? ?CSW was notified that DC will be held today. Facility is discussing with DON if they can accept with pt's drains. Facility stated they can accept tomorrow. Requested RN to update family on medical reasons for delayed DC. Plan to DC tomorrow. ? ?Expected Discharge Plan: [place] ?Barriers to Discharge: Barriers Resolved ? ?Expected Discharge Plan and Services ?Expected Discharge Plan: [place] ?In-house Referral: Clinical Social Work ?  ?  ?Living arrangements for the past 2 months: [place] ?                ?  ?  ?  ?  ?  ?  ?  ?  ?  ?  ? ? ?Social Determinants of Health (SDOH) Interventions ?  ? ?Readmission Risk Interventions ?   ? View : No data to display.  ?  ?  ?  ? ? ?

## 2021-07-03 NOTE — Progress Notes (Signed)
? Ian Boyd  ERX:[id number] DOB: Jan 25, 1951 DOA: 06/25/2021 ?PCP: Curlene Labrum, MD   ? ?Brief Narrative:  ?71 year old with a history of hepatitis C, right eye blindness, and dysphagia who presented to the ED with complaints of nausea vomiting and diarrhea x2 weeks associated with lower abdominal pain bilaterally.   ? ?Consultants:  ?Urology ?ID ?IR ? ?Code Status: FULL CODE ? ?DVT prophylaxis: ?SCDs ? ?Interim Hx: ?Afebrile.  Vital signs stable.  Appears to have clinically stabilized and is awaiting SNF placement.  Much more alert and conversant today.  States he feels the best he has thus far.  No new complaints. ? ?Foley catheter removed this morning.  Unfortunately patient was not able to urinate without his catheter.  Urecholine added.  Planned discharge to SNF today will be postponed until urinary retention resolved. ? ?Assessment & Plan: ? ?E. coli bacteremia with complicated UTI -severe sepsis ?Complicated by obstructive ureteral calculus -continue antibiotics as per ID -sepsis physiology resolved -transition to oral antibiotics with blessing of ID service ? ?Right psoas muscle E. coli abscess ?Noted on CT -drain placed by IR 4/6 -continue antibiotics as per ID -drain care as per IR ? ?Left kidney hydronephrosis and hydroureter ?Due to calculus obstruction at the left vesicoureteral junction -evaluated by urology (Dr. Alyson Ingles) with left percutaneous nephrostomy tube placed 4/6 by IR ? ?Acute urinary retention ?Foley catheter removed 4/13 a.m. -patient unable to void spontaneously thus far -Urecholine added -follow bladder scans -resist urged to replace Foley if able and favor in and out catheters as needed for bladder volume in excess of 300 cc ? ?Right rotator cuff tear ?X-ray suggest chronic rotator cuff injury -patient confirms symptoms for approximately 3 years -MRI confirms rotator cuff tear -Will need outpatient follow-up for this chronic issue ? ?Acute kidney injury ?Creatinine 2.9 on  admission -resolved with volume resuscitation ? ?History of hepatitis C ?Unclear if previously treated -HCV RNA pending per ID ? ?Crack cocaine abuse ?Has been counseled on need to discontinue ? ?Hyponatremia ?Sodium stable -monitor trend ? ?Hypokalemia ?Corrected with supplementation ? ?Thrombocytopenia ?Likely due to significant gram-negative rod infection -resolved ? ? ?Family Communication: No family present at time of exam ?Disposition: Plan is for SNF placement -appears medically stable for same when urinary retention resolved ? ?Objective: ?Blood pressure 139/80, pulse 91, temperature 98.6 ?F (37 ?C), temperature source Oral, resp. rate 16, height 6' (1.829 m), weight 57.3 kg, SpO2 96 %. ? ?Intake/Output Summary (Last 24 hours) at 07/03/2021 0905 ?Last data filed at 07/03/2021 0755 ?Gross per 24 hour  ?Intake --  ?Output 2705 ml  ?Net -2705 ml  ? ? ?Filed Weights  ? 06/25/21 1255 06/25/21 2322 06/29/21 0617  ?Weight: 56.7 kg 60.1 kg 57.3 kg  ? ? ?Examination: ?General: No acute respiratory distress  ?Lungs: Clear to auscultation bilaterally ?Cardiovascular: Regular rate and rhythm without murmur ?Abdomen: Nontender, nondistended, soft, bowel sounds positive, no rebound ?Extremities: No significant cyanosis, clubbing, or edema  ? ?CBC: ?Recent Labs  ?Lab 07/01/21 ?0926 07/02/21 ?0227 07/03/21 ?0207  ?WBC 16.7* 14.0* 15.1*  ?HGB 8.8* 8.3* 8.9*  ?HCT 26.4* 25.2* 27.1*  ?MCV 83.5 82.9 83.9  ?PLT 406* 463* 586*  ? ? ?Basic Metabolic Panel: ?Recent Labs  ?Lab 07/01/21 ?0926 07/02/21 ?0227 07/03/21 ?0207  ?NA 130* 130* 129*  ?K 4.3 4.2 4.3  ?CL 101 102 100  ?CO2 '23 23 23  '$ ?GLUCOSE 110* 109* 97  ?BUN '15 13 11  '$ ?CREATININE 0.97 0.98 0.99  ?CALCIUM  8.3* 8.2* 8.5*  ? ? ?GFR: ?Estimated Creatinine Clearance: 53.9 mL/min (by C-G formula based on SCr of 0.99 mg/dL). ? ?Liver Function Tests: ?Recent Labs  ?Lab 07/03/21 ?0207  ?AST 52*  ?ALT 19  ?ALKPHOS 78  ?BILITOT 0.9  ?PROT 6.0*  ?ALBUMIN 1.8*  ? ? ? ?Scheduled Meds: ?  brimonidine  1 drop Left Eye BID  ? Chlorhexidine Gluconate Cloth  6 each Topical Daily  ? dorzolamide-timolol  1 drop Left Eye BID  ? latanoprost  1 drop Left Eye QHS  ? sodium chloride flush  5 mL Intracatheter Q8H  ? tamsulosin  0.4 mg Oral QHS  ? ?Continuous Infusions: ?  ceFAZolin (ANCEF) IV 2 g (07/03/21 0500)  ? ? ? LOS: 8 days  ? ?Cherene Altes, MD ?[facility] ?Office  [phone] ?Pager - Text Page per Shea Evans ? ?If 7PM-7AM, please contact night-coverage per Amion ?07/03/2021, 9:05 AM ? ? ? ?

## 2021-07-03 NOTE — Progress Notes (Signed)
Physical Therapy Treatment ?Patient Details ?Name: Ian Boyd ?MRN: [id number] ?DOB: 1951/02/02 ?Today's Date: 07/03/2021 ? ? ?History of Present Illness 71 yo male admitted with N/V/D inability to move R arm, difficulty walking and expressive deficts. 4/5 admit due to sepsis CT abdomen pelvis shows probable psoas abscess v/s psoas hematoma with superimposed infection (+) crack cocaine use 4/6. Left percutaneous nephrostomy 2. Right psoas abscess JP drain. MRI of brain with no acute intracranial abnormality. PMH: blind R eye, drug use, smoker. ? ?  ?PT Comments  ? ?  Pt received in supine, agreeable to therapy session with encouragement, and good participation and tolerance for mobility progression with RW support. Emphasis on safe hand placement with transfers, sequencing bed mobility and transfers, gait training with focus on activity pacing and importance of OOB to chair daily. Pt receptive to instruction but anxious to attempt sitting up in recliner for long, mobility specialist notified he may want to return to bed shortly. HR/SpO2 [facility] with exertion and BP stable. Pt continues to benefit from PT services to progress toward functional mobility goals.   ?Recommendations for follow up therapy are one component of a multi-disciplinary discharge planning process, led by the attending physician.  Recommendations may be updated based on patient status, additional functional criteria and insurance authorization. ? ?Follow Up Recommendations ? Skilled nursing-short term rehab (<3 hours/day) ?  ?  ?Assistance Recommended at Discharge Frequent or constant Supervision/Assistance  ?Patient can return home with the following Two people to help with walking and/or transfers;A lot of help with bathing/dressing/bathroom;Assistance with cooking/housework;Direct supervision/assist for medications management;Direct supervision/assist for financial management;Assist for transportation;Help with stairs or ramp for entrance ?   ?Equipment Recommendations ? Rolling walker (2 wheels);BSC/3in1  ?  ?Recommendations for Other Services   ? ? ?  ?Precautions / Restrictions Precautions ?Precautions: Fall ?Precaution Comments: R groin JP drain; L abdomen pleural drain; mass R neck/shoulder region ?Restrictions ?Weight Bearing Restrictions: No  ?  ? ?Mobility ? Bed Mobility ?Overal bed mobility: Needs Assistance ?Bed Mobility: Rolling, Sidelying to Sit ?Rolling: Min assist ?Sidelying to sit: Mod assist, +2 for safety/equipment ?  ?  ?  ?General bed mobility comments: pt advancing BLEs to EOB but needed ModA +2 to elevate trunk into sitting, step by step cues needed for motor planning with compensatory strategies utilized d/t blindness in R eye ?  ? ?Transfers ?Overall transfer level: Needs assistance ?Equipment used: Rolling walker (2 wheels) ?Transfers: Sit to/from Stand ?Sit to Stand: +2 safety/equipment, Mod assist ?  ?  ?  ?  ?  ?General transfer comment: cues needed for hand placement, ModA to rise from EOB with intitially steadying assist needed ?  ? ?Ambulation/Gait ?Ambulation/Gait assistance: Min assist ?Gait Distance (Feet): 35 Feet ?Assistive device: Rolling walker (2 wheels) ?Gait Pattern/deviations: Step-through pattern, Decreased stride length, Trunk flexed, Narrow base of support ?Gait velocity: reduced ?Gait velocity interpretation: <1.31 ft/sec, indicative of household ambulator ?  ?General Gait Details: Pt with slow, shuffling steps with narrow BOS and forward head/downward gaze, pt with difficulty managing RW needs assist for directional navigation due to R eye blindness and nearsightedness; Pt c/o increased BLE fatigue/"heaviness" ? ? ?Stairs ?  ?  ?  ?  ?  ? ? ?Wheelchair Mobility ?  ? ?Modified Rankin (Stroke Patients Only) ?  ? ? ?  ?Balance Overall balance assessment: Needs assistance ?Sitting-balance support: Feet supported, No upper extremity supported ?Sitting balance-Leahy Scale: Fair ?  ?  ?Standing balance support:  Bilateral upper extremity  supported, During functional activity ?Standing balance-Leahy Scale: Fair ?Standing balance comment: Reliant on bil UE support of RW and external support with posterior LOB x2 needing minA to correct ?  ?  ?  ?  ?  ?  ?  ?  ?  ?  ?  ?  ? ?  ?Cognition Arousal/Alertness: Awake/alert ?Behavior During Therapy: [facility] for tasks assessed/performed ?Overall Cognitive Status: Impaired/Different from baseline ?Area of Impairment: Attention, Problem solving ?  ?  ?  ?  ?  ?  ?  ?  ?  ?Current Attention Level: Sustained ?  ?  ?  ?  ?Problem Solving: Slow processing, Requires verbal cues ?General Comments: increased time noted to process with pt needing repetition of cues during functional mobility/ADLs; does more than he think he can but needs encouragement ?  ?  ? ?  ?Exercises Other Exercises ?Other Exercises: supine BLE AROM: ankle pumps, HS, quad sets x5-10 reps ea ? ?  ?General Comments General comments (skin integrity, edema, etc.): SpO2 92-94% on RA; HR to 93 bpm with exertion ?  ?  ? ?Pertinent Vitals/Pain Pain Assessment ?Pain Assessment: Faces ?Faces Pain Scale: Hurts little more ?Pain Location: R shoulder, B hips ?Pain Descriptors / Indicators: Discomfort, Grimacing, Guarding ?Pain Intervention(s): Monitored during session, Repositioned  ? ? ? ?PT Goals (current goals can now be found in the care plan section) Acute Rehab PT Goals ?Patient Stated Goal: to move better, to get OOB. ?PT Goal Formulation: With patient ?Time For Goal Achievement: 07/11/21 ?Progress towards PT goals: Progressing toward goals ? ?  ?Frequency ? ? ? Min 2X/week ? ? ? ?  ?PT Plan Current plan remains appropriate  ? ? ?   ?AM-PAC PT "6 Clicks" Mobility   ?Outcome Measure ? Help needed turning from your back to your side while in a flat bed without using bedrails?: A Little ?Help needed moving from lying on your back to sitting on the side of a flat bed without using bedrails?: A Lot ?Help needed moving to and from a bed  to a chair (including a wheelchair)?: A Lot ?Help needed standing up from a chair using your arms (e.g., wheelchair or bedside chair)?: A Lot ?Help needed to walk in hospital room?: A Lot (mod cues) ?Help needed climbing 3-5 steps with a railing? : Total ?6 Click Score: 12 ? ?  ?End of Session Equipment Utilized During Treatment: Gait belt ?Activity Tolerance: Patient tolerated treatment well ?Patient left: in chair;with call bell/phone within reach;with nursing/sitter in room (NT taking vitals) ?Nurse Communication: Mobility status;Other (comment) (pt requesting return to bed soon from chair, mobility specialist notified) ?PT Visit Diagnosis: Unsteadiness on feet (R26.81);Other abnormalities of gait and mobility (R26.89);Muscle weakness (generalized) (M62.81);History of falling (Z91.81);Difficulty in walking, not elsewhere classified (R26.2);Pain ?Pain - Right/Left: Right ?Pain - part of body: Shoulder ?  ? ? ?Time: 1937-9024 ?PT Time Calculation (min) (ACUTE ONLY): 23 min ? ?Charges:  $Gait Training: 8-22 mins ?$Therapeutic Exercise: 8-22 mins          ?          ? ?Sedona Wenk P., PTA ?Acute Rehabilitation Services ?Secure Chat Preferred 9a-5:30pm ?Office: [phone]  ? ? ?Nichol Ator M Launa Goedken ?07/03/2021, 5:16 PM ? ?

## 2021-07-03 NOTE — Discharge Summary (Signed)
?DISCHARGE SUMMARY ? ?Ian Boyd ? ?MR#: [id number] ? ?DOB:23-Jun-1950  ?Date of Admission: 06/25/2021 ?Date of Discharge: 07/04/2021 ? ?Attending Physician:Norma Montemurro Hennie Duos, MD ? ?Patient's TIR:[id number], Virgina Evener, MD ? ?Consults: ?Urology ?ID ?IR ?  ?Disposition: D/C to SNF  ? ?Follow-up Appts: ? Follow-up Information   ? ? Mignon Pine, DO Follow up on 07/24/2021.   ?Specialties: Infectious Diseases, Internal Medicine ?Why: Appointment at 11:15 AM ?Contact information: ?[address] ?[address] ?[place] [address] ?[phone] ? ? ?  ?  ? ?  ?  ? ?  ? ? ?Abscess Drain Care: ?Flush drain w/ Normal Saline 5-10 mL three times daily. Change dressing Q3 days or prn if soiled. Record output from drain daily.  ? ?Discharge Diagnoses: ?E. coli bacteremia ?Complicated UTI ?Severe sepsis ?Right psoas muscle E. coli abscess ?Left kidney hydronephrosis and hydroureter ?Right rotator cuff tear ?Acute kidney injury ?History of hepatitis C ?Crack cocaine abuse ?Hyponatremia ?Hypokalemia ?Thrombocytopenia ? ?Initial presentation: ?71 year old with a history of hepatitis C, right eye blindness, and dysphagia who presented to the ED with complaints of nausea vomiting and diarrhea x2 weeks associated with lower abdominal pain bilaterally.   ? ?Hospital Course: ?Work-up following admission revealed left-sided hydronephrosis with hydroureter caused by an obstructing calculus at the left vesicoureteral junction.  The patient was evaluated by urology and ultimately a percutaneous nephrostomy tube was placed.  He was also found to be suffering with a right psoas muscle abscess.  Furthermore, he was diagnosed with a E. coli bacteremia, which was felt to have arisen from his complicated UTI.  His psoas muscle required placement of a drain by IR and cultures confirmed it also was E. coli, therefore linking it to his complicated UTI.  See individual issues discussed below: ? ?E. coli bacteremia with complicated UTI - severe  sepsis ?Complicated by obstructive ureteral calculus -continue antibiotics as per ID -sepsis physiology resolved ?  ?Right psoas muscle E. coli abscess ?Noted on CT -drain placed by IR 4/6 -continue antibiotics as per ID -drain care as per IR ?  ?Left kidney hydronephrosis and hydroureter ?Due to calculus obstruction at the left vesicoureteral junction -evaluated by urology (Dr. Alyson Ingles) with left percutaneous nephrostomy tube placed 4/6 by IR - to f/u w/ IR for care of the perc nephrostomy tube, and with Urology for definitive tx of the obstructing stone  ? ?Transient urinary retention ?The patient was initially medically cleared for discharge 07/03/2021 -unfortunately this discharge had to be delayed as the patient was not able to spontaneously urinate after his Foley catheter was removed -he was monitored with frequent bladder scans and dosed with Urecholine -1 attempt at an in and out catheterization was unsuccessful due to resistance believed to be related to BPH -fortunately however the patient was able to spontaneously void once his bladder volume exceeded 300 cc -at the time of his discharge he is urinating freely without difficulty ?  ?Right rotator cuff tear ?X-ray suggest chronic rotator cuff injury -patient confirms symptoms for approximately 3 years -MRI confirms rotator cuff tear - will need outpatient follow-up for this chronic issue, though it is unclear if he would be a candidate for any kind of intervention at this time ?  ?Acute kidney injury ?Creatinine 2.9 on admission -resolved with volume resuscitation ?  ?History of hepatitis C ?Unclear if previously treated -HCV RNA pending per ID -to follow-up in ID clinic as outpatient ?  ?Crack cocaine abuse ?Has been counseled on need to discontinue ?  ?  Hyponatremia ?Sodium stable during this hospital stay with his baseline appearing to be 129-134 ?  ?Hypokalemia ?Corrected with supplementation ?  ?Thrombocytopenia ?Likely due to significant  gram-negative rod infection -resolved ?  ? ?Allergies as of 07/04/2021   ?No Known Allergies ?  ? ?  ?Medication List  ?  ? ?TAKE these medications   ? ?brimonidine 0.2 % ophthalmic solution ?Commonly known as: ALPHAGAN ?SMARTSIG:In Eye(s) ?  ?cefadroxil 500 MG capsule ?Commonly known as: DURICEF ?Take 2 capsules (1,000 mg total) by mouth 2 (two) times daily. ?  ?dorzolamide-timolol 22.3-6.8 MG/ML ophthalmic solution ?Commonly known as: COSOPT ?Place 1 drop into the left eye 2 (two) times daily. ?  ?ibuprofen 600 MG tablet ?Commonly known as: ADVIL ?Take 600 mg by mouth every 6 (six) hours as needed. ?  ?multivitamin tablet ?Take 1 tablet by mouth daily. ?  ?NYQUIL SEVERE COLD/FLU PO ?Take 1 Dose by mouth at bedtime as needed (congestion). ?  ?pantoprazole 40 MG tablet ?Commonly known as: PROTONIX ?TAKE 1 TABLET (40 MG TOTAL) BY MOUTH 2 (TWO) TIMES DAILY BEFORE A MEAL. ?  ?polyethylene glycol-electrolytes 420 g solution ?Commonly known as: TriLyte ?Take 4,000 mLs by mouth as directed. ?  ?ProAir Digihaler 108 (90 Base) MCG/ACT Aepb ?Generic drug: Albuterol Sulfate (sensor) ?Inhale 1 puff into the lungs daily as needed (shortness of breath). ?  ?Rhopressa 0.02 % Soln ?Generic drug: Netarsudil Dimesylate ?Place 1 drop into the left eye at bedtime. ?  ?tamsulosin 0.4 MG Caps capsule ?Commonly known as: FLOMAX ?Take 0.4 mg by mouth at bedtime. ?  ?tiotropium 18 MCG inhalation capsule ?Commonly known as: SPIRIVA ?Place 18 mcg into inhaler and inhale daily. ?  ?Vyzulta 0.024 % Soln ?Generic drug: Latanoprostene Bunod ?Place 1 drop into the left eye at bedtime. ?  ? ?  ? ?  ?  ? ? ?  ?Discharge Care Instructions  ?(From admission, onward)  ?  ? ? ?  ? ?  Start     Ordered  ? 07/03/21 0000  Change dressing (specify)       ?Comments: Dressing change: as needed using gauze/tape to keep drain site clean and dry.  ? 07/03/21 1142  ? ?  ?  ? ?  ? ? ?Day of Discharge ?BP (!) 155/74 (BP Location: Left Arm)   Pulse 89   Temp 98.1  ?F (36.7 ?C) (Oral)   Resp 18   Ht 6' (1.829 m)   Wt 57.3 kg   SpO2 99%   BMI 17.13 kg/m?  ? ?Physical Exam: ?General: No acute respiratory distress ?Lungs: Clear to auscultation bilaterally without wheezes or crackles ?Cardiovascular: Regular rate and rhythm without murmur gallop or rub normal S1 and S2 ?Abdomen: Nontender, nondistended, soft, bowel sounds positive, no rebound, no ascites, no appreciable mass -both drain insertion sites clean and dry without surrounding erythema ?Extremities: No significant cyanosis, clubbing, or edema bilateral lower extremities ? ?Basic Metabolic Panel: ?Recent Labs  ?Lab 06/28/21 ?0135 06/30/21 ?8099 07/01/21 ?8338 07/02/21 ?2505 07/03/21 ?0207  ?NA 134* 131* 130* 130* 129*  ?K 3.9 4.2 4.3 4.2 4.3  ?CL 109 104 101 102 100  ?CO2 20* '22 23 23 23  '$ ?GLUCOSE 100* 103* 110* 109* 97  ?BUN 32* '18 15 13 11  '$ ?CREATININE 1.30* 1.09 0.97 0.98 0.99  ?CALCIUM 7.9* 8.2* 8.3* 8.2* 8.5*  ? ? ?Liver Function Tests: ?Recent Labs  ?Lab 07/03/21 ?0207  ?AST 52*  ?ALT 19  ?ALKPHOS 78  ?BILITOT 0.9  ?PROT  6.0*  ?ALBUMIN 1.8*  ? ? ?CBC: ?Recent Labs  ?Lab 06/28/21 ?0135 06/30/21 ?1950 07/01/21 ?9326 07/02/21 ?7124 07/03/21 ?0207  ?WBC 18.9* 22.6* 16.7* 14.0* 15.1*  ?HGB 9.7* 8.2* 8.8* 8.3* 8.9*  ?HCT 28.3* 25.1* 26.4* 25.2* 27.1*  ?MCV 80.4 83.1 83.5 82.9 83.9  ?PLT 87* 268 406* 463* 586*  ? ? ? ?Time spent in discharge (includes decision making & examination of pt): ?35 minutes ? ?07/04/2021, 9:50 AM  ? ?Cherene Altes, MD ?[facility] ?Office  [phone] ? ? ? ? ? ?

## 2021-07-03 NOTE — Plan of Care (Signed)
?  Problem: Education: ?Goal: Knowledge of General Education information will improve ?Description: Including pain rating scale, medication(s)/side effects and non-pharmacologic comfort measures ?Outcome: Progressing ?  ?Problem: Health Behavior/Discharge Planning: ?Goal: Ability to manage health-related needs will improve ?Outcome: Progressing ?  ?Problem: Clinical Measurements: ?Goal: Ability to maintain clinical measurements within normal limits will improve ?Outcome: Progressing ?Goal: Will remain free from infection ?Outcome: Progressing ?Goal: Diagnostic test results will improve ?Outcome: Progressing ?Goal: Respiratory complications will improve ?Outcome: Progressing ?Goal: Cardiovascular complication will be avoided ?Outcome: Progressing ?  ?Problem: Coping: ?Goal: Level of anxiety will decrease ?Outcome: Progressing ?  ?Problem: Elimination: ?Goal: Will not experience complications related to bowel motility ?Outcome: Progressing ?Goal: Will not experience complications related to urinary retention ?Outcome: Progressing ?  ?Problem: Skin Integrity: ?Goal: Risk for impaired skin integrity will decrease ?Outcome: Progressing ?  ?Problem: Safety: ?Goal: Ability to remain free from injury will improve ?Outcome: Progressing ?  ?

## 2021-07-03 NOTE — Care Management Important Message (Signed)
Important Message ? ?Patient Details  ?Name: Ian Boyd ?MRN: [id number] ?Date of Birth: [birth year] ? ? ?Medicare Important Message Given:  Yes ? ? ? ? ?Ian Boyd ?07/03/2021, 9:49 AM ?

## 2021-07-03 NOTE — Progress Notes (Addendum)
?   ? ?[facility] ? ?Date of Admission:  06/25/2021    ?       ?Reason for visit: Follow up on Bacteremia and Psoas Abscess ? ?Current antibiotics: ?Cefazolin 4/9 - present ?  ?Previous antibiotics: ?Ceftriaxone 4/6-4/8 ?Cefepime 4/5 ?MTZ 4/5 ?Vanco 4/5 ? ? ? ?ASSESSMENT:   ? ?71 y.o. male admitted with: ? ?E. coli bacteremia: Secondary to a urinary source with obstructing left calculus at the vesicoureteral junction.  Status post left nephrostomy tube placement 4/6 ?E. coli psoas abscess: Secondary to #1.  Status post IR drain placement 4/6. ?Acute kidney injury: Presented with creatinine 2.9 which has improved down to 0.99 today. ?Severe sepsis: Secondary to #1 and #2.  Improved. ?History of hepatitis C, genotype Ia: Untreated with viral load 2 million. ? ?RECOMMENDATIONS:   ? ?Continue cefazolin ?Continue to monitor drain output ?Hepatitis C treatment can be done as an outpatient ?Will follow ?ADDENDUM 12:12 PM: Notified by primary team that he will be discharging to SNF later today.  Will transition to cefadroxil 1 g twice daily.  Follow-up with myself made at [facility] on 07/24/2021 at 11:15 AM.  Patient will also follow-up with IR for drain management upon discharge with follow-up imaging to be determined based on his psoas abscess drain output.   ? ? ?Principal Problem: ?  Psoas abscess, right ([place]) ?Active Problems: ?  Hydronephrosis of left kidney ?  Urinary tract infection ?  Cocaine use ?  Rotator cuff tear arthropathy ?  AKI (acute kidney injury) ([place]) ?  E coli bacteremia ?  Thrombocytopenia ([place]) ?  Hypokalemia ?  Hyponatremia ? ? ? ?MEDICATIONS:   ? ?Scheduled Meds: ?? brimonidine  1 drop Left Eye BID  ?? Chlorhexidine Gluconate Cloth  6 each Topical Daily  ?? dorzolamide-timolol  1 drop Left Eye BID  ?? latanoprost  1 drop Left Eye QHS  ?? sodium chloride flush  5 mL Intracatheter Q8H  ?? tamsulosin  0.4 mg Oral QHS  ? ?Continuous Infusions: ??  ceFAZolin (ANCEF) IV 2 g (07/03/21  0500)  ? ?PRN Meds:.acetaminophen **OR** [DISCONTINUED] acetaminophen, albuterol, lip balm, ondansetron **OR** ondansetron (ZOFRAN) IV, oxyCODONE, senna-docusate ? ?SUBJECTIVE:  ? ?24 hour events:  ?No acute events noted overnight ?Patient still awaiting potential bed placement.  Preference is [facility] at this time ?Psoas drain remains in place with 40 cc documented yesterday ?No new cultures ?Psoas drain cultures finalized as E. coli ?WBC stable ?HCVRNA 2 million copies ?No new imaging ? ?No new complaints.  No fevers or chills.  Eating breakfast this morning.  Wants to know where [facility] is located. ? ?Review of Systems  ?All other systems reviewed and are negative. ? ?  ?OBJECTIVE:  ? ?Blood pressure 139/80, pulse 91, temperature 98.6 ?F (37 ?C), temperature source Oral, resp. rate 16, height 6' (1.829 m), weight 57.3 kg, SpO2 96 %. ?Body mass index is 17.13 kg/m?. ? ?Physical Exam ?Constitutional:   ?   General: He is not in acute distress. ?   Appearance: Normal appearance.  ?HENT:  ?   Head: Normocephalic and atraumatic.  ?Eyes:  ?   Extraocular Movements: Extraocular movements intact.  ?   Conjunctiva/sclera: Conjunctivae normal.  ?Cardiovascular:  ?   Rate and Rhythm: Normal rate and regular rhythm.  ?Pulmonary:  ?   Effort: Pulmonary effort is normal.  ?   Breath sounds: Normal breath sounds.  ?Abdominal:  ?   General: There is no distension.  ?  Palpations: Abdomen is soft.  ?Musculoskeletal:  ?   Comments: Right psoas drain in place ?Left nephrostomy tube in place  ?Skin: ?   General: Skin is warm and dry.  ?Neurological:  ?   General: No focal deficit present.  ?   Mental Status: He is alert and oriented to person, place, and time.  ?Psychiatric:     ?   Mood and Affect: Mood normal.     ?   Behavior: Behavior normal.  ? ? ? ?Lab Results: ?Lab Results  ?Component Value Date  ? WBC 15.1 (H) 07/03/2021  ? HGB 8.9 (L) 07/03/2021  ? HCT 27.1 (L) 07/03/2021  ? MCV 83.9 07/03/2021  ? PLT 586 (H)  07/03/2021  ?  ?Lab Results  ?Component Value Date  ? NA 129 (L) 07/03/2021  ? K 4.3 07/03/2021  ? CO2 23 07/03/2021  ? GLUCOSE 97 07/03/2021  ? BUN 11 07/03/2021  ? CREATININE 0.99 07/03/2021  ? CALCIUM 8.5 (L) 07/03/2021  ? GFRNONAA >60 07/03/2021  ?  ?Lab Results  ?Component Value Date  ? ALT 19 07/03/2021  ? AST 52 (H) 07/03/2021  ? ALKPHOS 78 07/03/2021  ? BILITOT 0.9 07/03/2021  ? ? ?No results found for: CRP ? ?No results found for: ESRSEDRATE ?  ?I have reviewed the micro and lab results in Epic. ? ?Imaging: ?No results found.  ? ?Imaging independently reviewed in Epic.  ? ? ?Mignon Pine ?[facility] ?[place] ?[phone] pager ?07/03/2021, 8:34 AM ? ? ?

## 2021-07-03 NOTE — Progress Notes (Signed)
Interventional Radiology Brief Note: ? ?PA made aware of patient discharging to SNF today.  ? ?Patient will discharge home with drain in place.  Flush drain daily with 5-10 mL sterile saline. Dressing changes Q3days or PRN if soiled/wet and record output once daily. Appreciate RN providing education on flushing/drain care. He will see [place] in Interventional Radiology in 7-10 days for follow up CT/possible drain injection. Orders in place.  Schedulers to contact with date and time of follow-up appointment.  ? ?Brynda Greathouse, MS RD PA-C ? ? ?

## 2021-07-04 DIAGNOSIS — M75100 Unspecified rotator cuff tear or rupture of unspecified shoulder, not specified as traumatic: Secondary | ICD-10-CM | POA: Diagnosis not present

## 2021-07-04 DIAGNOSIS — N390 Urinary tract infection, site not specified: Secondary | ICD-10-CM | POA: Diagnosis not present

## 2021-07-04 DIAGNOSIS — K4020 Bilateral inguinal hernia, without obstruction or gangrene, not specified as recurrent: Secondary | ICD-10-CM | POA: Diagnosis not present

## 2021-07-04 DIAGNOSIS — Z7401 Bed confinement status: Secondary | ICD-10-CM | POA: Diagnosis not present

## 2021-07-04 DIAGNOSIS — D414 Neoplasm of uncertain behavior of bladder: Secondary | ICD-10-CM | POA: Diagnosis not present

## 2021-07-04 DIAGNOSIS — E876 Hypokalemia: Secondary | ICD-10-CM | POA: Diagnosis not present

## 2021-07-04 DIAGNOSIS — R339 Retention of urine, unspecified: Secondary | ICD-10-CM | POA: Diagnosis not present

## 2021-07-04 DIAGNOSIS — K5900 Constipation, unspecified: Secondary | ICD-10-CM | POA: Diagnosis not present

## 2021-07-04 DIAGNOSIS — E878 Other disorders of electrolyte and fluid balance, not elsewhere classified: Secondary | ICD-10-CM | POA: Diagnosis not present

## 2021-07-04 DIAGNOSIS — N1330 Unspecified hydronephrosis: Secondary | ICD-10-CM | POA: Diagnosis not present

## 2021-07-04 DIAGNOSIS — B182 Chronic viral hepatitis C: Secondary | ICD-10-CM | POA: Diagnosis not present

## 2021-07-04 DIAGNOSIS — M6281 Muscle weakness (generalized): Secondary | ICD-10-CM | POA: Diagnosis not present

## 2021-07-04 DIAGNOSIS — R2681 Unsteadiness on feet: Secondary | ICD-10-CM | POA: Diagnosis not present

## 2021-07-04 DIAGNOSIS — K219 Gastro-esophageal reflux disease without esophagitis: Secondary | ICD-10-CM | POA: Diagnosis not present

## 2021-07-04 DIAGNOSIS — H26492 Other secondary cataract, left eye: Secondary | ICD-10-CM | POA: Diagnosis not present

## 2021-07-04 DIAGNOSIS — K6812 Psoas muscle abscess: Secondary | ICD-10-CM | POA: Diagnosis not present

## 2021-07-04 DIAGNOSIS — R1310 Dysphagia, unspecified: Secondary | ICD-10-CM | POA: Diagnosis not present

## 2021-07-04 DIAGNOSIS — R7881 Bacteremia: Secondary | ICD-10-CM | POA: Diagnosis not present

## 2021-07-04 DIAGNOSIS — N200 Calculus of kidney: Secondary | ICD-10-CM | POA: Diagnosis not present

## 2021-07-04 DIAGNOSIS — N179 Acute kidney failure, unspecified: Secondary | ICD-10-CM | POA: Diagnosis not present

## 2021-07-04 DIAGNOSIS — M75121 Complete rotator cuff tear or rupture of right shoulder, not specified as traumatic: Secondary | ICD-10-CM | POA: Diagnosis not present

## 2021-07-04 DIAGNOSIS — D696 Thrombocytopenia, unspecified: Secondary | ICD-10-CM | POA: Diagnosis not present

## 2021-07-04 DIAGNOSIS — N132 Hydronephrosis with renal and ureteral calculous obstruction: Secondary | ICD-10-CM | POA: Diagnosis not present

## 2021-07-04 DIAGNOSIS — E871 Hypo-osmolality and hyponatremia: Secondary | ICD-10-CM | POA: Diagnosis not present

## 2021-07-04 DIAGNOSIS — N323 Diverticulum of bladder: Secondary | ICD-10-CM | POA: Diagnosis not present

## 2021-07-04 DIAGNOSIS — A419 Sepsis, unspecified organism: Secondary | ICD-10-CM | POA: Diagnosis not present

## 2021-07-04 NOTE — Progress Notes (Signed)
Patients insurance authorization was approved for SNF. [facility] ID# [id number]. Insurance authorization approved from 4/14-4/18. CSW informed Narda Rutherford with [facility] of approval. ?

## 2021-07-04 NOTE — Progress Notes (Signed)
Report called and given to Nunzio Cory, RN @ [facility].  AVS printed and placed on chart for PTAR to pickup. ?

## 2021-07-04 NOTE — TOC Transition Note (Signed)
Transition of Care (TOC) - CM/SW Discharge Note ? ? ?Patient Details  ?Name: Ian Boyd ?MRN: [id number] ?Date of Birth: [birth year] ? ?Transition of Care (TOC) CM/SW Contact:  ?Vinie Sill, LCSW ?Phone Number: ?07/04/2021, 11:27 AM ? ? ?Clinical Narrative:    ? ?Patient will Discharge to: [facility]  ?Discharge Date: 07/04/2021 ?Family Notified: Sister,Linda ?Transport By:PTAR'@12'$ :30 ? ?Please call sister @ [phone] when PTAR arrives. ?Per MD patient is ready for discharge. RN, patient, and facility notified of discharge. Discharge Summary sent to facility. RN given number for [phone] Room 3242. Ambulance transport requested for patient.  ? ?Clinical Social Worker signing off. ? ?[phone], MSW, LCSW ?Clinical Social Worker ? ? ? ? ?Final next level of care: [facility] ?Barriers to Discharge: Barriers Resolved ? ? ?Patient Goals and CMS Choice ?  ?  ?  ? ?Discharge Placement ?  ?           ?Patient chooses bed at: [facility] ?Patient to be transferred to facility by: PTAR ?Name of family member notified: sister ?Patient and family notified of of transfer: 07/03/21 ? ?Discharge Plan and Services ?In-house Referral: Clinical Social Work ?  ?           ?  ?  ?  ?  ?  ?  ?  ?  ?  ?  ? ?Social Determinants of Health (SDOH) Interventions ?  ? ? ?Readmission Risk Interventions ?   ? View : No data to display.  ?  ?  ?  ? ? ? ? ? ?

## 2021-07-06 DIAGNOSIS — N1330 Unspecified hydronephrosis: Secondary | ICD-10-CM | POA: Diagnosis not present

## 2021-07-06 DIAGNOSIS — A419 Sepsis, unspecified organism: Secondary | ICD-10-CM | POA: Diagnosis not present

## 2021-07-06 DIAGNOSIS — N390 Urinary tract infection, site not specified: Secondary | ICD-10-CM | POA: Diagnosis not present

## 2021-07-08 ENCOUNTER — Other Ambulatory Visit: Payer: Self-pay | Admitting: *Deleted

## 2021-07-08 ENCOUNTER — Other Ambulatory Visit: Payer: Self-pay | Admitting: Internal Medicine

## 2021-07-08 DIAGNOSIS — K6812 Psoas muscle abscess: Secondary | ICD-10-CM

## 2021-07-08 NOTE — Patient Outreach (Signed)
Per [facility] eligible member currently resides in [facility].  Screened for potential post SNF care coordination needs.  ? ?Member's PCP at [facility] has [facility] care management services. ? ?Mr. Ian Boyd admitted to SNF on 07/04/21 after hospitalization. ? ?Secure communication sent to facility SW to make aware writer is following for potential care management/care coordination needs post SNF.  ? ?Will continue to follow.  ? ? ? ?Marthenia Rolling, MSN, RN,BSN ?[facility] Coordinator ?[phone] [facility]) ?[phone]  (Toll free office)   ?

## 2021-07-09 DIAGNOSIS — R7881 Bacteremia: Secondary | ICD-10-CM | POA: Diagnosis not present

## 2021-07-09 DIAGNOSIS — K5900 Constipation, unspecified: Secondary | ICD-10-CM | POA: Diagnosis not present

## 2021-07-09 DIAGNOSIS — N132 Hydronephrosis with renal and ureteral calculous obstruction: Secondary | ICD-10-CM | POA: Diagnosis not present

## 2021-07-09 DIAGNOSIS — N179 Acute kidney failure, unspecified: Secondary | ICD-10-CM | POA: Diagnosis not present

## 2021-07-09 DIAGNOSIS — K219 Gastro-esophageal reflux disease without esophagitis: Secondary | ICD-10-CM | POA: Diagnosis not present

## 2021-07-09 DIAGNOSIS — M75100 Unspecified rotator cuff tear or rupture of unspecified shoulder, not specified as traumatic: Secondary | ICD-10-CM | POA: Diagnosis not present

## 2021-07-09 DIAGNOSIS — E878 Other disorders of electrolyte and fluid balance, not elsewhere classified: Secondary | ICD-10-CM | POA: Diagnosis not present

## 2021-07-09 DIAGNOSIS — N390 Urinary tract infection, site not specified: Secondary | ICD-10-CM | POA: Diagnosis not present

## 2021-07-09 DIAGNOSIS — K6812 Psoas muscle abscess: Secondary | ICD-10-CM | POA: Diagnosis not present

## 2021-07-09 DIAGNOSIS — H26492 Other secondary cataract, left eye: Secondary | ICD-10-CM | POA: Diagnosis not present

## 2021-07-09 DIAGNOSIS — R339 Retention of urine, unspecified: Secondary | ICD-10-CM | POA: Diagnosis not present

## 2021-07-15 DIAGNOSIS — D414 Neoplasm of uncertain behavior of bladder: Secondary | ICD-10-CM | POA: Diagnosis not present

## 2021-07-16 ENCOUNTER — Ambulatory Visit
Admission: RE | Admit: 2021-07-16 | Discharge: 2021-07-16 | Disposition: A | Discharge status: Home / Self Care | Payer: Medicare Other | Source: Ambulatory Visit | Attending: Internal Medicine | Admitting: Internal Medicine

## 2021-07-16 ENCOUNTER — Ambulatory Visit
Admission: RE | Admit: 2021-07-16 | Discharge: 2021-07-16 | Disposition: A | Discharge status: Home / Self Care | Payer: Medicare Other | Source: Ambulatory Visit | Attending: Student | Admitting: Student

## 2021-07-16 DIAGNOSIS — A419 Sepsis, unspecified organism: Secondary | ICD-10-CM | POA: Diagnosis not present

## 2021-07-16 DIAGNOSIS — K6812 Psoas muscle abscess: Secondary | ICD-10-CM

## 2021-07-16 DIAGNOSIS — N323 Diverticulum of bladder: Secondary | ICD-10-CM | POA: Diagnosis not present

## 2021-07-16 DIAGNOSIS — K4020 Bilateral inguinal hernia, without obstruction or gangrene, not specified as recurrent: Secondary | ICD-10-CM | POA: Diagnosis not present

## 2021-07-16 DIAGNOSIS — N200 Calculus of kidney: Secondary | ICD-10-CM | POA: Diagnosis not present

## 2021-07-16 HISTORY — PX: IR RADIOLOGIST EVAL & MGMT: IMG5224

## 2021-07-16 NOTE — Progress Notes (Signed)
Interventional Radiology Progress Note ? ?Ian Boyd is 71 yo male SP hospitalization 06/25/21 for sepsis, with CT guided right psoas abscess drain (+ e coli), and left PCN.  ? ?He was discharged with follow up 07/04/21.  ? ?He returns today for routine abscess drain care, and we discover that the right psoas abscess drain has been displaced at his rehab facility.  Apparently this happened about 1 week ago.   ? ?He tells me he is feeling fine, with no fevers, rigors, chills.  He tells me that the drain had essentially stopped draining.  The left PCN continues to drain clear urine.  ? ?CT today shows improved right psoas collection, ~2.5cm residual.   ?The left PCN is in good position. I do not see any definite residual stones along the left ureter, or at the left UVJ.  ? ?I discussed the result of the scan with the patient and his sister.  I did let them know that there is no indication at this time to attempt drain replacement, given the timeframe and that he is asymptomatic. We will put him on schedule for a left PCN change, at which time we can do antegrade nephrostogram to see if patent.   ? ?He also tells me that he will be having surgery with urology for the residual stones.  If, at that time, he is determined to be stone free, the PCN could alternatively be removed.  ? ?Plan: ?- 3-4 month interval routine left PCN drain exchange.  If, by this time, he has had successful surgery with Urology for stone removal, we can consider antegrade nephrostogram and possible PCN removal.  ? ? ?Signed, ? ?Dulcy Fanny. Earleen Newport, DO ? ? ? ?

## 2021-07-17 ENCOUNTER — Telehealth: Payer: Self-pay

## 2021-07-17 NOTE — Telephone Encounter (Signed)
-----   Message from Jabier Mutton, MD sent at 07/16/2021  4:58 PM EDT ----- ?Patient had IR visit today and had dislodged psoas abscess drain that wasn't replaced as the repeat ct showed improved size. It appears he is doing well during IR visit ? ?He is in snf and is on cefadroxil 1 gram twice a day ? ?He is to see dr Juleen China on 5/04 ? ?Please let his snf know to keep appointment on 5/04 and have them do a cbc, cmp, crp testing a week prior to visit. ? ?Thank you ?

## 2021-07-17 NOTE — Telephone Encounter (Signed)
Called [facility] and attempted to speak with nurse or social worker at facility. Staff will send fax to facility regarding Dr. Gale Journey message to keep follow up with Dr. Juleen China on 07/24/21 ? Fax facility at [phone] ?Ian Boyd ? ?

## 2021-07-18 NOTE — Telephone Encounter (Signed)
Received receipt of successful fax transmission for appointment details and lab orders.  ? ?Beryle Flock, RN ? ?

## 2021-07-21 NOTE — Telephone Encounter (Signed)
Thank you :)

## 2021-07-23 ENCOUNTER — Other Ambulatory Visit: Payer: Self-pay | Admitting: Urology

## 2021-07-24 ENCOUNTER — Ambulatory Visit (INDEPENDENT_AMBULATORY_CARE_PROVIDER_SITE_OTHER): Payer: Medicare Other | Admitting: Internal Medicine

## 2021-07-24 ENCOUNTER — Encounter: Payer: Self-pay | Admitting: Internal Medicine

## 2021-07-24 ENCOUNTER — Other Ambulatory Visit: Payer: Self-pay

## 2021-07-24 VITALS — BP 154/83 | HR 88 | Temp 97.5°F

## 2021-07-24 DIAGNOSIS — R7881 Bacteremia: Secondary | ICD-10-CM | POA: Diagnosis not present

## 2021-07-24 DIAGNOSIS — K6812 Psoas muscle abscess: Secondary | ICD-10-CM

## 2021-07-24 DIAGNOSIS — B182 Chronic viral hepatitis C: Secondary | ICD-10-CM | POA: Diagnosis not present

## 2021-07-24 DIAGNOSIS — K219 Gastro-esophageal reflux disease without esophagitis: Secondary | ICD-10-CM | POA: Diagnosis not present

## 2021-07-24 DIAGNOSIS — N390 Urinary tract infection, site not specified: Secondary | ICD-10-CM

## 2021-07-24 DIAGNOSIS — B9620 Unspecified Escherichia coli [E. coli] as the cause of diseases classified elsewhere: Secondary | ICD-10-CM

## 2021-07-24 NOTE — Patient Instructions (Signed)
Thank you for coming to see me today. It was a pleasure seeing you. ? ?To Do: ?Resume taking Cefadroxil 1 gram twice daily. ?Continue this through 08/14/21 as long as patients urologic procedure is completed on 08/11/21 as scheduled. ?Follow up with me in 6 weeks to ensure still doing well and to discuss hepatitis C treatment.  ? ?If you have any questions or concerns, please do not hesitate to call the office at [phone]. ? ?Take Care,  ? ?Jule Ser ? ?

## 2021-07-24 NOTE — Progress Notes (Signed)
?  ? ? ? ? ?[facility] ? ?CHIEF COMPLAINT:   ? ?Follow up for psoas abscess ? ?SUBJECTIVE:   ? ?Ian Boyd is a 71 y.o. male with PMHx as below who presents to the clinic for psoas abscess.  ? ?Patient was admitted at [facility] from 4/5-4/14/23 for severe sepsis due to E coli bacteremia from a urinary source complicated by obstructive ureteral calculus s/p nephrostomy tube placement.  He also was found to have a right psoas abscess s/p IR guided drain placement 4/6 where cultures also grew E coli.  He was treated with IV antibiotics as an inpatient and narrowed to oral cefadroxil at discharge.  ? ?He presented to IR on 4/26 for drain evaluation and it was discovered that the right psoas drain had been displaced at his facility approximately 1 week prior.  He was feeling well at the time without fevers, chills.  A Ct scan was repeated showing improving right psoas collection of about 2.5cm residual.  His PCN was in good position and draining clear urine.  He is scheduled for stone intervention and tube removal with urology on 5/22.   ? ?He presents to clinic today from his SNF.  He does not know if he is on antibiotics.  There are no labs recently and he does not have cefadroxil listed on his medication list.  He is overall feeling well. ? ?Please see A&P for the details of today's visit and status of the patient's medical problems.  ? ?Patient's Medications  ?New Prescriptions  ? No medications on file  ?Previous Medications  ? ALBUTEROL SULFATE, SENSOR, (PROAIR DIGIHALER) 108 (90 BASE) MCG/ACT AEPB    Inhale 1 puff into the lungs daily as needed (shortness of breath).  ? BRIMONIDINE (ALPHAGAN) 0.2 % OPHTHALMIC SOLUTION    SMARTSIG:In Eye(s)  ? CEFADROXIL (DURICEF) 500 MG CAPSULE    Take 2 capsules (1,000 mg total) by mouth 2 (two) times daily.  ? DORZOLAMIDE-TIMOLOL (COSOPT) 22.3-6.8 MG/ML OPHTHALMIC SOLUTION    Place 1 drop into the left eye 2 (two) times daily.  ? IBUPROFEN (ADVIL)  600 MG TABLET    Take 600 mg by mouth every 6 (six) hours as needed.  ? MULTIPLE VITAMIN (MULTIVITAMIN) TABLET    Take 1 tablet by mouth daily.  ? PANTOPRAZOLE (PROTONIX) 40 MG TABLET    TAKE 1 TABLET (40 MG TOTAL) BY MOUTH 2 (TWO) TIMES DAILY BEFORE A MEAL.  ? PHENYLEPH-DOXYLAMINE-DM-APAP (NYQUIL SEVERE COLD/FLU PO)    Take 1 Dose by mouth at bedtime as needed (congestion).  ? POLYETHYLENE GLYCOL-ELECTROLYTES (TRILYTE) 420 G SOLUTION    Take 4,000 mLs by mouth as directed.  ? RHOPRESSA 0.02 % SOLN    Place 1 drop into the left eye at bedtime.  ? TAMSULOSIN (FLOMAX) 0.4 MG CAPS CAPSULE    Take 0.4 mg by mouth at bedtime.  ? TIOTROPIUM (SPIRIVA) 18 MCG INHALATION CAPSULE    Place 18 mcg into inhaler and inhale daily.  ? VYZULTA 0.024 % SOLN    Place 1 drop into the left eye at bedtime.  ?Modified Medications  ? No medications on file  ?Discontinued Medications  ? No medications on file  ?   ? ?Past Medical History:  ?Diagnosis Date  ? Acid reflux   ? Blind right eye   ? Drug abuse ([place])   ? ? ?Social History  ? ?Tobacco Use  ? Smoking status: Every Day  ?  Packs/day: 0.50  ?  Types: Cigarettes  ? Smokeless tobacco: Never  ? Tobacco comments:  ?  1/2 pack a day  ?Vaping Use  ? Vaping Use: Never used  ?Substance Use Topics  ? Alcohol use: Never  ?  Comment: quit 5-7 yrs ago.   ? Drug use: Not Currently  ?  Types: Cocaine, Marijuana  ?  Comment: Does marijuana and cocaine.   ? ? ?No family history on file. ? ?No Known Allergies ? ?Review of Systems  ?Constitutional: Negative.   ?Respiratory: Negative.    ?Cardiovascular: Negative.   ?Gastrointestinal: Negative.   ? ? ?OBJECTIVE:   ? ?Vitals:  ? 07/24/21 1047  ?BP: (!) 154/83  ?Pulse: 88  ?Temp: (!) 97.5 ?F (36.4 ?C)  ?TempSrc: Oral  ?SpO2: 98%  ? ?There is no height or weight on file to calculate BMI. ? ?Physical Exam ?Constitutional:   ?   Appearance: Normal appearance.  ?HENT:  ?   Head: Normocephalic and atraumatic.  ?Eyes:  ?   Extraocular Movements: Extraocular  movements intact.  ?   Conjunctiva/sclera: Conjunctivae normal.  ?Pulmonary:  ?   Effort: Pulmonary effort is normal. No respiratory distress.  ?Abdominal:  ?   General: Bowel sounds are normal.  ?   Palpations: Abdomen is soft.  ?   Comments: Left nephrostomy tube in place ?Right psoas drain removed.  ?Musculoskeletal:     ?   General: Normal range of motion.  ?   Cervical back: Normal range of motion and neck supple.  ?Skin: ?   General: Skin is warm and dry.  ?Neurological:  ?   General: No focal deficit present.  ?   Mental Status: He is alert and oriented to person, place, and time.  ?Psychiatric:     ?   Mood and Affect: Mood normal.     ?   Behavior: Behavior normal.  ? ? ? ?Labs and Microbiology: ? ?  Latest Ref Rng & Units 07/03/2021  ?  2:07 AM 07/02/2021  ?  2:27 AM 07/01/2021  ?  9:26 AM  ?CBC  ?WBC 4.0 - 10.5 K/uL 15.1   14.0   16.7    ?Hemoglobin 13.0 - 17.0 g/dL 8.9   8.3   8.8    ?Hematocrit 39.0 - 52.0 % 27.1   25.2   26.4    ?Platelets 150 - 400 K/uL 586   463   406    ? ? ?  Latest Ref Rng & Units 07/03/2021  ?  2:07 AM 07/02/2021  ?  2:27 AM 07/01/2021  ?  9:26 AM  ?CMP  ?Glucose 70 - 99 mg/dL 97   109   110    ?BUN 8 - 23 mg/dL '11   13   15    '$ ?Creatinine 0.61 - 1.24 mg/dL 0.99   0.98   0.97    ?Sodium 135 - 145 mmol/L 129   130   130    ?Potassium 3.5 - 5.1 mmol/L 4.3   4.2   4.3    ?Chloride 98 - 111 mmol/L 100   102   101    ?CO2 22 - 32 mmol/L '23   23   23    '$ ?Calcium 8.9 - 10.3 mg/dL 8.5   8.2   8.3    ?Total Protein 6.5 - 8.1 g/dL 6.0      ?Total Bilirubin 0.3 - 1.2 mg/dL 0.9      ?Alkaline Phos 38 - 126 U/L 78      ?  AST 15 - 41 U/L 52      ?ALT 0 - 44 U/L 19      ?  ? ? ? ?ASSESSMENT & PLAN:   ? ?Psoas abscess, right ([place]) ?Patient is clinically improved with follow up CT scan showing improved psoas abscess.  Residual collection is about 2.5cm on 4/26 scan after his abscess drain was accidentally dislodged.  It was not replaced given his overall improvement.  He is having his definitive stone  management procedure with urology on 5/22.  I am not sure when Cefadroxil fell off his medication list but will plan to continue cefadroxil 1gm BID PO for 3 more weeks through 5/25 at which point antibiotics can be stopped.  His residual collection was so small and he will have received several weeks of therapy from date of last CT that I do not think repeating another CT is needed at this time unless he has a clinical change to suggest otherwise.  I think he would benefit from continued rehab and skilled nursing to help with managing his nephrostomy tube and to ensure he completes antibiotic therapy as directed.  ? ?Hepatitis C ? ?I will see him again in about 6 weeks and discuss getting treatment started for his hepatitis C once his other acute issues have been dealt with.  ? ? ? ? ? ? ?Mignon Pine ?[facility] ?[facility] ?07/24/2021, 11:19 AM ? ?I have personally spent 30 minutes involved in face-to-face and non-face-to-face activities for this patient on the day of the visit. Professional time spent includes the following activities: Preparing to see the patient (review of tests), Obtaining and/or reviewing separately obtained history (admission/discharge record), Performing a medically appropriate examination and/or evaluation , Ordering medications/tests/procedures, referring and communicating with other health care professionals, Documenting clinical information in the EMR, Independently interpreting results (not separately reported), Communicating results to the patient/family/caregiver, Counseling and educating the patient/family/caregiver and Care coordination (not separately reported).  ? ? ?

## 2021-07-24 NOTE — Assessment & Plan Note (Signed)
Patient is clinically improved with follow up CT scan showing improved psoas abscess.  Residual collection is about 2.5cm on 4/26 scan after his abscess drain was accidentally dislodged.  It was not replaced given his overall improvement.  He is having his definitive stone management procedure with urology on 5/22.  I am not sure when Cefadroxil fell off his medication list but will plan to continue cefadroxil 1gm BID PO for 3 more weeks through 5/25 at which point antibiotics can be stopped.  His residual collection was so small and he will have received several weeks of therapy from date of last CT that I do not think repeating another CT is needed at this time unless he has a clinical change to suggest otherwise.  I think he would benefit from continued rehab and skilled nursing to help with managing his nephrostomy tube and to ensure he completes antibiotic therapy as directed.  ?

## 2021-07-24 NOTE — Assessment & Plan Note (Signed)
?  I will see him again in about 6 weeks and discuss getting treatment started for his hepatitis C once his other acute issues have been dealt with.  ?

## 2021-07-31 ENCOUNTER — Encounter (HOSPITAL_COMMUNITY): Payer: Self-pay | Admitting: Urology

## 2021-07-31 NOTE — Progress Notes (Addendum)
For Short Stay: [place] appointment date: N/A Date of COVID positive in last 36 days:N/A  Bowel Prep reminder: N/A   For Anesthesia: PCP - Wenda Low MD/Steven Silvano Rusk, MD Cardiologist -   Chest x-ray - 06/25/21 in epic EKG - 06/26/21 in epic Stress Test - N/A ECHO - N/A Cardiac Cath - N/A Pacemaker/ICD device last checked: N/A Pacemaker orders received: N/A Device Rep notified: N/A  Spinal Cord Stimulator:N/A  Sleep Study - N/A CPAP - N/A  Fasting Blood Sugar - N/A Checks Blood Sugar ___N/A__ times a day Date and result of last Hgb A1c-N/A  Blood Thinner Instructions: N/A Aspirin Instructions: N/A Last Dose: N/A  Activity level:  activities of daily living without stopping and without chest pain and/or shortness of breath    Anesthesia review: COPD, Hep. C, Thrombocytopenia  Patient denies shortness of breath, fever, cough and chest pain at PAT appointment   Patient verbalized understanding of instructions that were given to them at the PAT appointment. Patient was also instructed that they will need to review over the PAT instructions again at home before surgery.

## 2021-08-02 DIAGNOSIS — I10 Essential (primary) hypertension: Secondary | ICD-10-CM | POA: Diagnosis not present

## 2021-08-02 DIAGNOSIS — E039 Hypothyroidism, unspecified: Secondary | ICD-10-CM | POA: Diagnosis not present

## 2021-08-02 DIAGNOSIS — E559 Vitamin D deficiency, unspecified: Secondary | ICD-10-CM | POA: Diagnosis not present

## 2021-08-08 ENCOUNTER — Encounter (HOSPITAL_COMMUNITY): Payer: Self-pay | Admitting: Urology

## 2021-08-08 NOTE — Progress Notes (Signed)
Shelia at [facility] confirmed receipt of faxed pre op instructions and stated she will give to Mr. Galant nurse.

## 2021-08-08 NOTE — Patient Instructions (Addendum)
Preop instructions for: Ian Boyd Date of Birth:    09/16/1950                  Date of Procedure:   Monday, Aug 11, 2021 Procedure:  CYSTOSCOPY/URETEROSCOPY/HOLMIUM LASER/STENT PLACEMENT/ POSSIBLE BLADDER BIOPSY/ LEFT NEPHROSTOMY TUBE REMOVAL    Surgeon: Dr. Rexene Alberts Facility contact: [facility]  Phone:  [phone]               Health Care POA: RN contact name/phone#: Emmaline Life RN                          Fax #: [phone]   Transportation contact phone#: Ritta Slot  [phone]   Time to arrive at [facility]: 1015 AM   Report to: Admitting (On your left hand side)    Do not eat past midnight the night before your procedure.(To include any tube feedings-must be discontinued)  May have the following liquids from [place] 5/21 until 10:15 AM 08/11/21  CLEAR LIQUID DIET  Liquids Allowed                                                                     Exclude  Water, Black Coffee, and tea, regular and decaf             ALL SOLID FOODS! Plain Jell-O in any flavor  (No red)                                     Fruit ices (not with fruit pulp)                                            MILK, SOUPS, Iced Popsicles (No red)                                                                            Apple juices                                                                         ORANGE JUICE Sports drinks like Gatorade (No red)                                  Lightly seasoned clear broth or consume(fat free) Sugar/Sweetner    Take these morning medications only with sips of water.(or give through gastrostomy or feeding tube). Pantoprazole  Use Inhaler and  eyedrops per normal routine day of surgery  Bring rescue inhaler to hospital day of surgery   Note: No Insulin or Diabetic meds should be given or taken the morning of the procedure!   Please send day of procedure:current med list and meds last taken that day, confirm nothing  by mouth status from what time, Patient Demographic info( to include DNR status, problem list, allergies)   Bring Insurance card and picture ID Leave all jewelry and other valuables at place where living( no metal or rings to be worn) No contact lens Men-no colognes,lotions   Any questions day of procedure,call  SHORT STAY-[phone]     Sent from :[facility]                   Phone:[phone]                   Fax:[phone]   Sent by :    Harlon Flor [facility]

## 2021-08-11 ENCOUNTER — Ambulatory Visit (HOSPITAL_COMMUNITY): Payer: Medicare Other

## 2021-08-11 ENCOUNTER — Encounter (HOSPITAL_COMMUNITY): Payer: Self-pay | Admitting: Urology

## 2021-08-11 ENCOUNTER — Other Ambulatory Visit: Payer: Self-pay

## 2021-08-11 ENCOUNTER — Other Ambulatory Visit (HOSPITAL_COMMUNITY): Payer: Self-pay

## 2021-08-11 ENCOUNTER — Ambulatory Visit (HOSPITAL_COMMUNITY)
Admission: RE | Admit: 2021-08-11 | Discharge: 2021-08-11 | Disposition: A | Discharge status: Rehabilitation Facility | Payer: Medicare Other | Source: Home / Self Care | Attending: Urology | Admitting: Urology

## 2021-08-11 ENCOUNTER — Ambulatory Visit (HOSPITAL_COMMUNITY): Payer: Medicare Other | Admitting: Physician Assistant

## 2021-08-11 ENCOUNTER — Encounter (HOSPITAL_COMMUNITY)
Admission: RE | Disposition: A | Discharge status: Rehabilitation Facility | Payer: Self-pay | Source: Home / Self Care | Attending: Urology

## 2021-08-11 ENCOUNTER — Ambulatory Visit (HOSPITAL_BASED_OUTPATIENT_CLINIC_OR_DEPARTMENT_OTHER): Payer: Medicare Other | Admitting: Physician Assistant

## 2021-08-11 DIAGNOSIS — J449 Chronic obstructive pulmonary disease, unspecified: Secondary | ICD-10-CM | POA: Diagnosis not present

## 2021-08-11 DIAGNOSIS — H409 Unspecified glaucoma: Secondary | ICD-10-CM | POA: Diagnosis not present

## 2021-08-11 DIAGNOSIS — K219 Gastro-esophageal reflux disease without esophagitis: Secondary | ICD-10-CM | POA: Insufficient documentation

## 2021-08-11 DIAGNOSIS — N200 Calculus of kidney: Secondary | ICD-10-CM | POA: Diagnosis not present

## 2021-08-11 DIAGNOSIS — Z20822 Contact with and (suspected) exposure to covid-19: Secondary | ICD-10-CM | POA: Diagnosis not present

## 2021-08-11 DIAGNOSIS — E876 Hypokalemia: Secondary | ICD-10-CM | POA: Diagnosis not present

## 2021-08-11 DIAGNOSIS — N136 Pyonephrosis: Secondary | ICD-10-CM | POA: Diagnosis not present

## 2021-08-11 DIAGNOSIS — M75101 Unspecified rotator cuff tear or rupture of right shoulder, not specified as traumatic: Secondary | ICD-10-CM | POA: Diagnosis not present

## 2021-08-11 DIAGNOSIS — Z8619 Personal history of other infectious and parasitic diseases: Secondary | ICD-10-CM | POA: Insufficient documentation

## 2021-08-11 DIAGNOSIS — N202 Calculus of kidney with calculus of ureter: Secondary | ICD-10-CM | POA: Diagnosis not present

## 2021-08-11 DIAGNOSIS — N390 Urinary tract infection, site not specified: Secondary | ICD-10-CM | POA: Diagnosis not present

## 2021-08-11 DIAGNOSIS — D696 Thrombocytopenia, unspecified: Secondary | ICD-10-CM

## 2021-08-11 DIAGNOSIS — Z79899 Other long term (current) drug therapy: Secondary | ICD-10-CM | POA: Diagnosis not present

## 2021-08-11 DIAGNOSIS — N401 Enlarged prostate with lower urinary tract symptoms: Secondary | ICD-10-CM | POA: Insufficient documentation

## 2021-08-11 DIAGNOSIS — R6520 Severe sepsis without septic shock: Secondary | ICD-10-CM | POA: Diagnosis not present

## 2021-08-11 DIAGNOSIS — A419 Sepsis, unspecified organism: Secondary | ICD-10-CM | POA: Diagnosis not present

## 2021-08-11 DIAGNOSIS — F17210 Nicotine dependence, cigarettes, uncomplicated: Secondary | ICD-10-CM | POA: Insufficient documentation

## 2021-08-11 DIAGNOSIS — N210 Calculus in bladder: Secondary | ICD-10-CM

## 2021-08-11 DIAGNOSIS — K6812 Psoas muscle abscess: Secondary | ICD-10-CM | POA: Diagnosis not present

## 2021-08-11 DIAGNOSIS — N3289 Other specified disorders of bladder: Secondary | ICD-10-CM | POA: Insufficient documentation

## 2021-08-11 DIAGNOSIS — I700 Atherosclerosis of aorta: Secondary | ICD-10-CM | POA: Diagnosis not present

## 2021-08-11 DIAGNOSIS — Z681 Body mass index (BMI) 19 or less, adult: Secondary | ICD-10-CM | POA: Diagnosis not present

## 2021-08-11 DIAGNOSIS — N323 Diverticulum of bladder: Secondary | ICD-10-CM | POA: Diagnosis not present

## 2021-08-11 DIAGNOSIS — R000 Tachycardia, unspecified: Secondary | ICD-10-CM | POA: Diagnosis not present

## 2021-08-11 DIAGNOSIS — B182 Chronic viral hepatitis C: Secondary | ICD-10-CM

## 2021-08-11 DIAGNOSIS — R338 Other retention of urine: Secondary | ICD-10-CM | POA: Diagnosis not present

## 2021-08-11 DIAGNOSIS — I10 Essential (primary) hypertension: Secondary | ICD-10-CM | POA: Diagnosis not present

## 2021-08-11 DIAGNOSIS — R636 Underweight: Secondary | ICD-10-CM | POA: Diagnosis not present

## 2021-08-11 DIAGNOSIS — R059 Cough, unspecified: Secondary | ICD-10-CM | POA: Diagnosis not present

## 2021-08-11 DIAGNOSIS — N179 Acute kidney failure, unspecified: Secondary | ICD-10-CM | POA: Diagnosis not present

## 2021-08-11 DIAGNOSIS — A4152 Sepsis due to Pseudomonas: Secondary | ICD-10-CM | POA: Diagnosis not present

## 2021-08-11 HISTORY — DX: Unspecified glaucoma: H40.9

## 2021-08-11 HISTORY — DX: Chronic obstructive pulmonary disease, unspecified: J44.9

## 2021-08-11 HISTORY — DX: Thrombocytopenia, unspecified: D69.6

## 2021-08-11 HISTORY — DX: Inflammatory liver disease, unspecified: K75.9

## 2021-08-11 HISTORY — DX: Hypo-osmolality and hyponatremia: E87.1

## 2021-08-11 HISTORY — PX: CYSTOSCOPY/URETEROSCOPY/HOLMIUM LASER/STENT PLACEMENT: SHX6546

## 2021-08-11 HISTORY — DX: Benign prostatic hyperplasia without lower urinary tract symptoms: N40.0

## 2021-08-11 HISTORY — DX: Personal history of other infectious and parasitic diseases: Z86.19

## 2021-08-11 HISTORY — DX: Unspecified rotator cuff tear or rupture of unspecified shoulder, not specified as traumatic: M75.100

## 2021-08-11 LAB — COMPREHENSIVE METABOLIC PANEL
ALT: 41 U/L (ref 0–44)
AST: 50 U/L — ABNORMAL HIGH (ref 15–41)
Albumin: 3.3 g/dL — ABNORMAL LOW (ref 3.5–5.0)
Alkaline Phosphatase: 102 U/L (ref 38–126)
Anion gap: 10 (ref 5–15)
BUN: 12 mg/dL (ref 8–23)
CO2: 22 mmol/L (ref 22–32)
Calcium: 9.2 mg/dL (ref 8.9–10.3)
Chloride: 107 mmol/L (ref 98–111)
Creatinine, Ser: 1.15 mg/dL (ref 0.61–1.24)
GFR, Estimated: >60 mL/min (ref >60)
Glucose, Bld: 93 mg/dL (ref 70–99)
Potassium: 4.1 mmol/L (ref 3.5–5.1)
Sodium: 139 mmol/L (ref 135–145)
Total Bilirubin: 0.6 mg/dL (ref 0.3–1.2)
Total Protein: 7.8 g/dL (ref 6.5–8.1)

## 2021-08-11 LAB — CBC
HCT: 41.2 % (ref 39.0–52.0)
Hemoglobin: 12.3 g/dL — ABNORMAL LOW (ref 13.0–17.0)
MCH: 27.1 pg (ref 26.0–34.0)
MCHC: 29.9 g/dL — ABNORMAL LOW (ref 30.0–36.0)
MCV: 90.7 fL (ref 80.0–100.0)
Platelets: 380 10*3/uL (ref 150–400)
RBC: 4.54 MIL/uL (ref 4.22–5.81)
RDW: 15.7 % — ABNORMAL HIGH (ref 11.5–15.5)
WBC: 9 10*3/uL (ref 4.0–10.5)
nRBC: 0 % (ref 0.0–0.2)

## 2021-08-11 SURGERY — CYSTOSCOPY/URETEROSCOPY/HOLMIUM LASER/STENT PLACEMENT
Anesthesia: General | Site: Urethra

## 2021-08-11 MED ORDER — ACETAMINOPHEN 160 MG/5ML PO SOLN: 325.0000 mg | ORAL | Status: DC | PRN

## 2021-08-11 MED ORDER — ONDANSETRON HCL 4 MG/2ML IJ SOLN
INTRAMUSCULAR | Status: DC | PRN
  Administered 2021-08-11: 4 mg via INTRAVENOUS

## 2021-08-11 MED ORDER — CHLORHEXIDINE GLUCONATE 0.12 % MT SOLN
15.0000 mL | Freq: Once | OROMUCOSAL | Status: AC
  Administered 2021-08-11: 15 mL via OROMUCOSAL

## 2021-08-11 MED ORDER — PROPOFOL 10 MG/ML IV BOLUS
INTRAVENOUS | Status: DC | PRN
  Administered 2021-08-11: 140 mg via INTRAVENOUS

## 2021-08-11 MED ORDER — FENTANYL CITRATE PF 50 MCG/ML IJ SOSY
25.0000 ug | PREFILLED_SYRINGE | Freq: Once | INTRAMUSCULAR | Status: DC
Start: 2021-08-11 — End: 2021-08-11

## 2021-08-11 MED ORDER — MIDAZOLAM HCL 2 MG/2ML IJ SOLN
INTRAMUSCULAR | Status: AC
  Filled 2021-08-11: qty 2

## 2021-08-11 MED ORDER — OXYCODONE HCL 5 MG PO TABS: 5.0000 mg | ORAL_TABLET | Freq: Once | ORAL | Status: DC | PRN

## 2021-08-11 MED ORDER — LABETALOL HCL 5 MG/ML IV SOLN
INTRAVENOUS | Status: AC
  Administered 2021-08-11: 5 mg via INTRAVENOUS
  Filled 2021-08-11: qty 4

## 2021-08-11 MED ORDER — LABETALOL HCL 5 MG/ML IV SOLN
INTRAVENOUS | Status: AC
  Administered 2021-08-11: 10 mg via INTRAVENOUS
  Filled 2021-08-11: qty 4

## 2021-08-11 MED ORDER — IOHEXOL 300 MG/ML  SOLN
INTRAMUSCULAR | Status: DC | PRN
  Administered 2021-08-11: 10 mL via URETHRAL
  Administered 2021-08-11: 7 mL

## 2021-08-11 MED ORDER — ACETAMINOPHEN 325 MG PO TABS: 325.0000 mg | ORAL_TABLET | ORAL | Status: DC | PRN

## 2021-08-11 MED ORDER — OXYCODONE-ACETAMINOPHEN 5-325 MG PO TABS: 1.0000 | ORAL_TABLET | ORAL | 0 refills | Status: DC | PRN

## 2021-08-11 MED ORDER — FENTANYL CITRATE (PF) 100 MCG/2ML IJ SOLN
INTRAMUSCULAR | Status: AC
  Filled 2021-08-11: qty 2

## 2021-08-11 MED ORDER — MIDAZOLAM HCL 5 MG/5ML IJ SOLN
INTRAMUSCULAR | Status: DC | PRN
  Administered 2021-08-11 (×2): 1 mg via INTRAVENOUS

## 2021-08-11 MED ORDER — DEXAMETHASONE SODIUM PHOSPHATE 10 MG/ML IJ SOLN
INTRAMUSCULAR | Status: DC | PRN
  Administered 2021-08-11: 5 mg via INTRAVENOUS

## 2021-08-11 MED ORDER — DEXAMETHASONE SODIUM PHOSPHATE 10 MG/ML IJ SOLN
INTRAMUSCULAR | Status: AC
  Filled 2021-08-11: qty 1

## 2021-08-11 MED ORDER — ORAL CARE MOUTH RINSE: 15.0000 mL | Freq: Once | OROMUCOSAL | Status: AC

## 2021-08-11 MED ORDER — ONDANSETRON HCL 4 MG/2ML IJ SOLN
INTRAMUSCULAR | Status: AC
  Filled 2021-08-11: qty 2

## 2021-08-11 MED ORDER — OXYCODONE-ACETAMINOPHEN 5-325 MG PO TABS
1.0000 | ORAL_TABLET | ORAL | 0 refills | Status: DC | PRN
  Filled 2021-08-11: qty 20, 4d supply, fill #0

## 2021-08-11 MED ORDER — LABETALOL HCL 5 MG/ML IV SOLN
5.0000 mg | INTRAVENOUS | Status: AC | PRN
  Administered 2021-08-11 (×2): 5 mg via INTRAVENOUS

## 2021-08-11 MED ORDER — CEPHALEXIN 500 MG PO CAPS: 500.0000 mg | ORAL_CAPSULE | Freq: Two times a day (BID) | ORAL | 0 refills | Status: DC

## 2021-08-11 MED ORDER — LIDOCAINE HCL (CARDIAC) PF 100 MG/5ML IV SOSY
PREFILLED_SYRINGE | INTRAVENOUS | Status: DC | PRN
  Administered 2021-08-11: 40 mg via INTRAVENOUS

## 2021-08-11 MED ORDER — DOCUSATE SODIUM 100 MG PO CAPS: 100.0000 mg | ORAL_CAPSULE | Freq: Every day | ORAL | 0 refills | Status: AC | PRN

## 2021-08-11 MED ORDER — FENTANYL CITRATE (PF) 100 MCG/2ML IJ SOLN
INTRAMUSCULAR | Status: DC | PRN
  Administered 2021-08-11 (×5): 50 ug via INTRAVENOUS

## 2021-08-11 MED ORDER — ACETAMINOPHEN 10 MG/ML IV SOLN: 1000.0000 mg | Freq: Once | INTRAVENOUS | Status: DC | PRN

## 2021-08-11 MED ORDER — FENTANYL CITRATE PF 50 MCG/ML IJ SOSY: 25.0000 ug | PREFILLED_SYRINGE | INTRAMUSCULAR | Status: DC | PRN

## 2021-08-11 MED ORDER — LIDOCAINE HCL (PF) 2 % IJ SOLN
INTRAMUSCULAR | Status: AC
  Filled 2021-08-11: qty 5

## 2021-08-11 MED ORDER — SODIUM CHLORIDE 0.9 % IR SOLN
Status: DC | PRN
  Administered 2021-08-11: 6000 mL via INTRAVESICAL
  Administered 2021-08-11: 1000 mL

## 2021-08-11 MED ORDER — LABETALOL HCL 5 MG/ML IV SOLN: 10.0000 mg | INTRAVENOUS | Status: AC | PRN

## 2021-08-11 MED ORDER — LACTATED RINGERS IV SOLN: INTRAVENOUS | Status: DC

## 2021-08-11 MED ORDER — AMISULPRIDE (ANTIEMETIC) 5 MG/2ML IV SOLN: 10.0000 mg | Freq: Once | INTRAVENOUS | Status: DC | PRN

## 2021-08-11 MED ORDER — PROPOFOL 10 MG/ML IV BOLUS
INTRAVENOUS | Status: AC
  Filled 2021-08-11: qty 20

## 2021-08-11 MED ORDER — OXYCODONE HCL 5 MG/5ML PO SOLN: 5.0000 mg | Freq: Once | ORAL | Status: DC | PRN

## 2021-08-11 MED ORDER — CEPHALEXIN 500 MG PO CAPS
500.0000 mg | ORAL_CAPSULE | Freq: Two times a day (BID) | ORAL | 0 refills | Status: DC
  Filled 2021-08-11: qty 6, 3d supply, fill #0

## 2021-08-11 MED ORDER — CEFAZOLIN SODIUM-DEXTROSE 2-4 GM/100ML-% IV SOLN
2.0000 g | Freq: Once | INTRAVENOUS | Status: AC
  Administered 2021-08-11: 2 g via INTRAVENOUS
  Filled 2021-08-11: qty 100

## 2021-08-11 MED ORDER — DOCUSATE SODIUM 100 MG PO CAPS
100.0000 mg | ORAL_CAPSULE | Freq: Every day | ORAL | 0 refills | Status: DC | PRN
  Filled 2021-08-11: qty 30, 30d supply, fill #0

## 2021-08-11 SURGICAL SUPPLY — 32 items
BAG DRN RND TRDRP ANRFLXCHMBR (UROLOGICAL SUPPLIES) ×1
BAG URINE DRAIN 2000ML AR STRL (UROLOGICAL SUPPLIES) ×1 IMPLANT
BAG URO CATCHER STRL LF (MISCELLANEOUS) ×2 IMPLANT
BASKET ZERO TIP NITINOL 2.4FR (BASKET) ×1 IMPLANT
BSKT STON RTRVL ZERO TP 2.4FR (BASKET) ×1
CATH FOLEY 2WAY SLVR  5CC 16FR (CATHETERS) ×1
CATH FOLEY 2WAY SLVR 5CC 16FR (CATHETERS) IMPLANT
CATH URETL OPEN 5X70 (CATHETERS) ×2 IMPLANT
CLOTH BEACON ORANGE TIMEOUT ST (SAFETY) ×2 IMPLANT
DRSG TEGADERM 2-3/8X2-3/4 SM (GAUZE/BANDAGES/DRESSINGS) ×1 IMPLANT
FIBER LASER MOSES 200 DFL (Laser) IMPLANT
GLOVE BIOGEL M 7.0 STRL (GLOVE) ×4 IMPLANT
GOWN STRL REUS W/ TWL XL LVL3 (GOWN DISPOSABLE) ×1 IMPLANT
GOWN STRL REUS W/TWL XL LVL3 (GOWN DISPOSABLE) ×6
GUIDEWIRE ANG ZIPWIRE 038X150 (WIRE) ×1 IMPLANT
GUIDEWIRE STR DUAL SENSOR (WIRE) ×6 IMPLANT
GUIDEWIRE ZIPWRE .038 STRAIGHT (WIRE) IMPLANT
KIT TURNOVER KIT A (KITS) ×1 IMPLANT
LASER FIB FLEXIVA PULSE ID 365 (Laser) IMPLANT
MANIFOLD NEPTUNE II (INSTRUMENTS) ×2 IMPLANT
PACK CYSTO (CUSTOM PROCEDURE TRAY) ×2 IMPLANT
SHEATH NAVIGATOR HD 12/14X46 (SHEATH) ×1 IMPLANT
SHEATH URETERAL 12FR 45CM (SHEATH) IMPLANT
STENT URET 6FRX28 CONTOUR (STENTS) ×1 IMPLANT
SYR 10ML LL (SYRINGE) ×1 IMPLANT
SYR TOOMEY IRRIG 70ML (MISCELLANEOUS) ×2
SYRINGE TOOMEY IRRIG 70ML (MISCELLANEOUS) IMPLANT
TOWEL GREEN STERILE FF (TOWEL DISPOSABLE) IMPLANT
TRACTIP FLEXIVA PULS ID 200XHI (Laser) IMPLANT
TRACTIP FLEXIVA PULSE ID 200 (Laser) ×2
TUBING CONNECTING 10 (TUBING) ×2 IMPLANT
TUBING UROLOGY SET (TUBING) ×2 IMPLANT

## 2021-08-11 NOTE — OR Nursing (Signed)
Called Ian Boyd and went over instructions with his nurse. Spoke with Dr. Abner Greenspan who wrote out Prescriptions to send to facility.  Patient's BP is high and treating with labetalol.  Spoke with patient's sister on the phone as well.

## 2021-08-11 NOTE — Anesthesia Procedure Notes (Signed)
Procedure Name: LMA Insertion Date/Time: 08/11/2021 1:28 PM Performed by: Jonna Munro, CRNA Pre-anesthesia Checklist: Patient identified, Emergency Drugs available, Suction available, Patient being monitored and Timeout performed Patient Re-evaluated:Patient Re-evaluated prior to induction Oxygen Delivery Method: Circle system utilized Preoxygenation: Pre-oxygenation with 100% oxygen Induction Type: IV induction LMA: LMA inserted LMA Size: 4.0 Number of attempts: 1 Placement Confirmation: positive ETCO2 and breath sounds checked- equal and bilateral Tube secured with: Tape Dental Injury: Teeth and Oropharynx as per pre-operative assessment

## 2021-08-11 NOTE — Progress Notes (Addendum)
Patient presented with elevated BP  (see flow sheet) upon arrival to Short Stay.  Dr. Smith Robert notified, no new orders given at this time. Will continue to monitor.

## 2021-08-11 NOTE — Op Note (Signed)
Operative Note  Preoperative diagnosis:  1.  Right renal stone 2. Bladder diverticulum 3. Bladder stones  Postoperative diagnosis: 1.  Right renal stone 2. Bladder diverticulum 3. Bladder stones  Procedure(s): 1.  Cystoscopy 2. Right ureteroscopy with laser lithotripsy and basket extraction of stones 3. Bilateral retrograde pyelogram 4. Left antegrade pyelogram 5. Removal of bladder stones 5. Right ureteral stent placement 6. Fluoroscopy with intraoperative interpretation  Surgeon: Rexene Alberts, MD  Assistants:  None  Anesthesia:  General  Complications:  None  EBL:  Minimal  Specimens: 1. Stones for stone analysis (to be done at [facility])  Drains/Catheters: 1.  Right 6Fr x 28cm ureteral stent WITH tether string tied to a foley catheter 2. 16 Fr foley catheter 3. Indwelling left nephrostomy tube  Intraoperative findings:   Cystoscopy demonstrated severely trabeculated bladder with 3 large posterior bladder diverticulum containing bladder stones and debris.  This required multiple irrigations to clear the debris and bladder stones.  No suspicious appearing lesion in the bladder. Identify the left ureteral orifice however could not pass a wire beyond the J hooking ureter.  Left retrograde pyelogram demonstrated no significant hydronephrosis.  There was drainage of contrast at the end of the case. Left antegrade pyelogram demonstrated no hydronephrosis.  The contrast did drain at by the end of the case. Right ureteroscopy demonstrated 3 separate at least 7 mm right renal stones.  These were fragmented and basket extracted. Successful right ureteral stent placement.  Indication:  Ian Boyd is a 71 y.o. male with history of bilateral renal stones who underwent left nephrostomy tube is here for definitive treatment of stones.  CT A/P on 07/16/2021 demonstrated no further left ureteral stones.  His nephrostomy tube was in the appropriate position.  It also  demonstrated several right renal stones.  Description of procedure: After informed consent was obtained from the patient, the patient was identified and taken to the operating room and placed in the supine position.  General anesthesia was administered as well as perioperative IV antibiotics.  At the beginning of the case, a time-out was performed to properly identify the patient, the surgery to be performed, and the surgical site.  Sequential compression devices were applied to the lower extremities at the beginning of the case for DVT prophylaxis.  The patient was then placed in the dorsal lithotomy supine position, prepped and draped in sterile fashion.  We then passed the 21-French rigid cystoscope through the urethra and into the bladder under vision without any difficulty, noting a normal urethra without strictures and a non-obstructing prostate.  A systematic evaluation of the bladder revealed no evidence of any suspicious bladder lesions.  He did have a severely trabeculated bladder with several large posterior bladder diverticulum including 1 at the former place of the trigone.  This required several irrigations in order to adequately drain his bladder.  I did evacuate several stones and debris.  Identified his left ureteral orifice however I was not able to pass a wire beyond the distal left ureter given the large J hooking ureter.  I did perform a left retrograde pyelogram demonstrating contrast that was passed into the proximal ureter.  This tic drained by the end of the case.  Right retrograde pyelogram demonstrated no significant hydronephrosis.  Left antegrade pyelogram demonstrated the contrast would clear down towards the bladder by the end the case.    Under cystoscopic and flouroscopic guidance, we cannulated the right ureteral orifice with a 5-French open-ended ureteral catheter and a gentle  retrograde pyelogram was performed, revealing a normal caliber ureter without any filling defects.  There was no hydronephrosis of the collecting system. A 0.038 sensor wire was then passed up to the level of the renal pelvis and secured to the drape as a safety wire. The ureteral catheter and cystoscope were removed, leaving the safety wire in place.   A semi-rigid ureteroscope was passed alongside the wire up the distal ureter which appeared normal. A second 0.038 sensor wire was passed under direct vision and the semirigid scope was removed.  The ureter was widely patent.  A 12/14 Fr ureteral access sheath was carefully advanced up the ureter to the level of the UPJ over this wire under fluoroscopic guidance. The flexible ureteroscope was advanced into the collecting system via the access sheath. The collecting system was inspected. The calculus was identified at the upper pole and lower pole calyces. Using the 272 micron holmium laser fiber, the stones were fragmented completely. A 2.2 Fr zero tip basket was used to remove the fragments under visual guidance. These were sent for chemical analysis. With the ureteroscope in the kidney, a gentle pyelogram was performed to delineate the calyceal system and we evaluated the calyces systematically. We encountered a no further stone fragments. The rest of the stone fragments were very tiny and these were  irrigated away gently. The calyces were re-inspected and there were no significant stone fragment residual.   We then withdrew the ureteroscope back down the ureter along with the access sheath, noting no evidence of any stones along the course of the ureter.  Prior to removing the ureteroscope, we did pass the Glidewire back up to the ureter to the renal pelvis.    Once the ureteroscope was removed, the Glidewire was backloaded through the rigid cystoscope, which was then advanced down the urethra and into the bladder. We then used the Glidewire under direct vision through the rigid cystoscope and under fluoroscopic guidance and passed up a 6-French, 28 cm  double-pigtail ureteral stent up ureter, making sure that the proximal and distal ends coiled within the kidney and bladder respectively.  Note that we left a long tether string attached to the distal end of the ureteral stent and it exited the urethral meatus.  I then tied the string to a Foley catheter.  I then placed a Foley catheter given his large bladder diverticulum and I wanted adequate drainage following this case.    The patient tolerated the procedure well and there was no complication. Patient was awoken from anesthesia and taken to the recovery room in stable condition. I was present and scrubbed for the entirety of the case.  Plan:  Patient will be discharged home.  Follow up with me in 3 days for stent removal in the office.  We will also remove Foley catheter.  I will have him clamp his left nephrostomy tube tomorrow morning.  As long as he has no increased pain or fevers, we will plan to remove this left nephrostomy tube on Thursday morning.  If he does have pain or fevers, he knows to call me, and we may need to proceed with left percutaneous nephro ureteral stent placement in order to access his ureter.   [facility]  Pager: [phone]

## 2021-08-11 NOTE — H&P (Addendum)
CC/HPI: Ian Boyd is a 71 year old male seen in follow-up today for history of an elevated PSA, BPH/LUTS, bladder lesion and urolithiasis.   1. Elevated PSA:  -He was referred by his PCP, Florene Route, MD in 08/2020. He was found to have a PSA of 5.8 and 04/2020. He was then found urinary tract infection with a course of Bactrim. Urine culture in 04/2020 resulted 100,000 E. coli. Patient states that his symptoms resolved and he had no further dysuria. Repeat PSA on 07/26/2020 was 6.1.  -S/p TRUS biopsy 10/22/2020 with BPH. There is 1 core of HGPIN. TRUS volume 38 cc.  He denies a family history of prostate cancer. He denies taking alpha-blocker or 5 alpha reductase inhibitors. He denies bone pain or unexpected weight loss. Stable appetite.  -Given his age and comorbidities, he is elected not to proceed with any further PSA evaluations for that think is reasonable.   #2. BPH/LUTS: He developed worsening urinary tract symptoms and 2022. Start on tamsulosin continues to take 0.4 mg daily. He states he has had much pain with this medication. He denies any recent urinary retention episodes.   #3. Urolithiasis:  -He presented the ED on 06/25/2021 with a 2-week history of right abdominal pain. CT A/P 06/25/2021 demonstrated 5 mm stone in the left UVJ with left hydroureteronephrosis and right psoas abscess. He had a large volume of gas within his bladder. He also had nodularity along the posterior right wall of the bladder. He had bilateral stones present including 7 mm right renal stone and a couple punctate stones in the right lower pole. He also has punctate left renal stones. Also present was a right psoas abscess versus psoas hematoma.  -Urine culture resulted E. coli.  -S/p left nephrostomy tube placement and right psoas drain on 06/26/2021.  -Patient states that the psoas drain became dislodged.  -He denies fevers or chills.   #4. Bladder lesion: CT A/P 06/25/2021 with bladder lesion present on the  right aspect. He denies gross hematuria. He denies abdominal pain or pelvic pain. He denies family history of bladder cancer. He does report a significant smoking history smoking 1 pack/day for about 60 years.  -Cystoscopy 07/15/2021 with severely trabeculated bladder with bladder debris present. I was unable to identify a lesion however visualization was limited as debris was present.   He also reports some difficulty obtaining and maintaining erection satisfactory for intercourse. His SHIM score is 17.   Patient currently denies fever, chills, sweats, nausea, vomiting, abdominal or flank pain, gross hematuria or dysuria.     ALLERGIES: nkda    MEDICATIONS: Tamsulosin Hcl 0.4 mg capsule 1 capsule PO Q HS  Dorzolamide-Timolol  Ibuprofen  Miralax  Multivitamin  Pantoprazole Sodium 40 mg tablet, delayed release  Proair Digihaler  Rhopressa 0.02 % drops  Spiriva Handihaler  Vyzulta 0.024 % drops     GU PSH: Prostate Needle Biopsy - 10/22/2020     NON-GU PSH: Surgical Pathology, Gross And Microscopic Examination For Prostate Needle - 10/22/2020     GU PMH: BPH w/LUTS - 10/22/2020, - 09/10/2020 Elevated PSA - 10/22/2020, - 09/10/2020 Incomplete bladder emptying - 10/22/2020, - 09/10/2020 Urinary Frequency - 10/22/2020, - 09/10/2020 Acute Cystitis/UTI - 09/10/2020    NON-GU PMH: None   FAMILY HISTORY: 2 sons - Runs in Family Diabetes - Runs in Family Hypertension - Runs in Family   SOCIAL HISTORY: Marital Status: Single Ethnicity: Not Hispanic Or Latino; Race: Black or African American Current Smoking Status: Patient smokes. Has smoked  since 08/22/1970.   Tobacco Use Assessment Completed: Used Tobacco in last 30 days? Has never drank.     REVIEW OF SYSTEMS:    GU Review Male:   Patient denies frequent urination, hard to postpone urination, burning/ pain with urination, get up at night to urinate, leakage of urine, stream starts and stops, trouble starting your stream, have to strain to  urinate , erection problems, and penile pain.  Gastrointestinal (Upper):   Patient denies nausea, vomiting, and indigestion/ heartburn.  Gastrointestinal (Lower):   Patient denies diarrhea and constipation.  Constitutional:   Patient denies fever, night sweats, weight loss, and fatigue.  Skin:   Patient denies skin rash/ lesion and itching.  Eyes:   Patient denies blurred vision and double vision.  Ears/ Nose/ Throat:   Patient denies sore throat and sinus problems.  Hematologic/Lymphatic:   Patient denies easy bruising and swollen glands.  Cardiovascular:   Patient denies leg swelling and chest pains.  Respiratory:   Patient denies cough and shortness of breath.  Endocrine:   Patient denies excessive thirst.  Musculoskeletal:   Patient denies back pain and joint pain.  Neurological:   Patient denies headaches and dizziness.  Psychologic:   Patient denies depression and anxiety.   VITAL SIGNS: None   MULTI-SYSTEM PHYSICAL EXAMINATION:    Constitutional: Well-nourished. No physical deformities. Normally developed. Good grooming.  Respiratory: No labored breathing, no use of accessory muscles.   Cardiovascular: Normal temperature, normal extremity pulses, no swelling, no varicosities.  Gastrointestinal: No mass, no tenderness, no rigidity, non obese abdomen.     Complexity of Data:  Source Of History:  Patient, Medical Record Summary  Lab Test Review:   PSA  Records Review:   Previous Doctor Records, Previous Hospital Records  Urine Test Review:   Urinalysis  X-Ray Review: C.T. Abdomen/Pelvis: Reviewed Films. Reviewed Report. Discussed With Patient.    Notes:                     CLINICAL DATA: Abdominal pain. Acute. Nonlocalized.     EXAM:  CT ABDOMEN AND PELVIS WITHOUT CONTRAST     TECHNIQUE:  Multidetector CT imaging of the abdomen and pelvis was performed  following the standard protocol without IV contrast.     RADIATION DOSE REDUCTION: This exam was performed according to  the  departmental dose-optimization program which includes automated  exposure control, adjustment of the mA and/or kV according to  patient size and/or use of iterative reconstruction technique.     COMPARISON: None.     FINDINGS:  Lower chest: Lung bases are clear.     Hepatobiliary: Benign cyst in the LEFT hepatic lobe. No biliary duct  dilatation. Gallbladder normal.     Pancreas: Pancreas is normal. No ductal dilatation. No pancreatic  inflammation.     Spleen: Normal spleen     Adrenals/urinary tract: Adrenal glands normal.     Hydronephrosis and hydroureter the LEFT collecting system. Ureter is  difficult to follow as there is little intra-abdominal fat however,  there is potentially a obstructing calculus at the LEFT ureteral  junction measuring 5 mm (image 73/2). This is adjacent to a large  posterior diverticulum measuring 4.5 cm which collects several  calcifications.     Several additional calculi within the LEFT kidney.     Multiple small calculi within the RIGHT kidney. No RIGHT  hydroureter.     Along the posterior RIGHT wall the bladder there is a focus of  nodularity  measuring 7 mm (image 72/2) there is significant volume  of gas within the bladder.     Stomach/Bowel: Stomach, small bowel, appendix, and cecum are normal.  The colon and rectosigmoid colon are normal.     Vascular/Lymphatic: Abdominal aorta is normal caliber. No periportal  or retroperitoneal adenopathy. No pelvic adenopathy.     Reproductive: Unremarkable     Other: No intraperitoneal free for     Musculoskeletal: There is expansion of the RIGHT psoas muscle to 4.9  x 5.6 cm (image 54/2). there is gas centrally within the expanded  muscle.     IMPRESSION:  1. Expanded RIGHT psoas muscle with central gas consistent with a  PSOAS ABSCESS versus psoas hematoma with superimposed infection.  2. LEFT hydronephrosis and hydroureter appears to be secondary to  calculus obstruction at the  LEFT vesicoureteral junction. Calculus  in the medial facet in the vicinity of a posterior LEFT bladder  diverticulum with dependent stones.  3. Large volume of gas within the bladder.  4. Nodularity along the posterior RIGHT wall of the bladder. Cannot  exclude bladder neoplasm.  5. Bilateral nephrolithiasis.        Electronically Signed  By: Suzy Bouchard M.D.  On: 06/25/2021 15:33   PROCEDURES:         Flexible Cystoscopy - 52000  Risks, benefits, and some of the potential complications of the procedure were discussed at length with the patient including infection, bleeding, voiding discomfort, urinary retention, fever, chills, sepsis, and others. All questions were answered. Informed consent was obtained. Antibiotic prophylaxis was given. Sterile technique and intraurethral analgesia were used.  Meatus:  Normal size. Normal location. Normal condition.  Urethra:  No strictures.  External Sphincter:  Normal.  Verumontanum:  Normal.  Prostate:  Borderline obstructing. Moderate hyperplasia.  Bladder Neck:  Non-obstructing.  Ureteral Orifices:  Normal location. Normal size. Normal shape. Effluxed clear urine.  Bladder:  Severe trabeculation. Layering debris present making visualization more difficult. I was unable to identify a suspicious appearing lesion.      The lower urinary tract was carefully examined. The procedure was well-tolerated and without complications. Antibiotic instructions were given. Instructions were given to call the office immediately for bloody urine, difficulty urinating, urinary retention, painful or frequent urination, fever, chills, nausea, vomiting or other illness. The patient stated that he understood these instructions and would comply with them.   ASSESSMENT:      ICD-10 Details  1 GU:   BPH w/LUTS - N40.1   2   Elevated PSA - R97.20   3   Urinary Frequency - R35.0   4   Ureteral calculus - N20.1   5   Bladder tumor/neoplasm - D41.4    PLAN:            Orders Labs Urine Cytology          Document Letter(s):  Created for Patient: Clinical Summary         Notes:   #1. Elevated PSA:  -PSA of 5.8 in 04/2018 around the time of UTI  -Repeat PSA of 6.1 in 07/26/2020  -S/p TRUS biopsy 10/22/2020 with BPH. There is 1 core of HGPIN. TRUS volume 38 cc.  -Given his age and comorbidities, he is elected not to proceed with any further PSA evaluations for that think is reasonable.   2. BPH with LUTS: Continue tamsulosin.   #3. Urolithiasis:  -I reviewed recent hospital course with CT A/P 06/2021 with 5 mm left UVJ stone, proximal left  hydroureteronephrosis and right psoas abscess along with bladder lesion as well as a 7 mm nonobstructing right renal stone.  -I recommend cystoscopy, bilateral ureteroscopy with laser lithotripsy, bilateral retrograde stent placement,, possible bladder biopsy. Surgery letter sent.   #4. Bladder lesion: Cystoscopy 07/15/2021 with severely trabeculated bladder with layering debris present. Visualization was limited given this. I was unable to identify any suspicious lesion.  We will send urine for cytology.  -We will proceed with possible bladder biopsy with concomitant laser lithotripsy as above. Surgery letter sent.     Urology Preoperative H&P   Chief Complaint: bladder lesion, left ureteral stone, right renal stones  History of Present Illness: TALBERT TREMBATH is a 71 y.o. male with left ureteral stone and possible bladder lesion here for cysto, bladder biopsy, bilateral ureteroscopy with laser lithotripsy and basket extraction of stone, left PCN removal. Denies fevers, chills, dysuria.    Past Medical History:  Diagnosis Date   Acid reflux    Blind right eye    BPH (benign prostatic hyperplasia)    COPD (chronic obstructive pulmonary disease) (HCC)    Drug abuse (HCC)    Glaucoma    Hepatitis C    History of severe sepsis    Hyponatremia    Psoas abscess, right ([place]) 06/2021   Rotator cuff tear     Chronic right   Sepsis ([place]) 06/2021   E coli bacteremia from a urinary source complicated by obstructive ureteral calculus   Thrombocytopenia (HCC)     Past Surgical History:  Procedure Laterality Date   CATARACT EXTRACTION Left    ESOPHAGEAL DILATION N/A 05/12/2018   Procedure: ESOPHAGEAL DILATION;  Surgeon: Rogene Houston, MD;  Location: [facility];  Service: Endoscopy;  Laterality: N/A;   ESOPHAGOGASTRODUODENOSCOPY N/A 05/12/2018   Procedure: ESOPHAGOGASTRODUODENOSCOPY (EGD);  Surgeon: Rogene Houston, MD;  Location: [facility];  Service: Endoscopy;  Laterality: N/A;  2:45   IR NEPHROSTOMY PLACEMENT LEFT  06/26/2021   IR RADIOLOGIST EVAL & MGMT  07/16/2021    Allergies: No Known Allergies  History reviewed. No pertinent family history.  Social History:  reports that he has been smoking cigarettes. He has been smoking an average of .5 packs per day. He has never used smokeless tobacco. He reports that he does not currently use drugs after having used the following drugs: Cocaine and Marijuana. He reports that he does not drink alcohol.  ROS: A complete review of systems was performed.  All systems are negative except for pertinent findings as noted.  Physical Exam:  Vital signs in last 24 hours: Temp:  [97.8 F (36.6 C)] 97.8 F (36.6 C) (05/22 1136) Pulse Rate:  [88] 88 (05/22 1136) Resp:  [20] 20 (05/22 1136) BP: (174-183)/(102-104) 183/104 (05/22 1202) SpO2:  [100 %] 100 % (05/22 1136) Weight:  [57.6 kg] 57.6 kg (05/22 1147) Constitutional:  Alert and oriented, No acute distress Cardiovascular: Regular rate and rhythm Respiratory: Normal respiratory effort, Lungs clear bilaterally GI: Abdomen is soft, nontender, nondistended, no abdominal masses GU: No CVA tenderness Lymphatic: No lymphadenopathy Neurologic: Grossly intact, no focal deficits Psychiatric: Normal mood and affect  Laboratory Data:  Recent Labs    08/11/21 1158  WBC 9.0  HGB 12.3*  HCT 41.2   PLT 380    Recent Labs    08/11/21 1158  NA 139  K 4.1  CL 107  GLUCOSE 93  BUN 12  CALCIUM 9.2  CREATININE 1.15     Results for orders placed  or performed during the hospital encounter of 08/11/21 (from the past 24 hour(s))  Comprehensive metabolic panel per protocol     Status: Abnormal   Collection Time: 08/11/21 11:58 AM  Result Value Ref Range   Sodium 139 135 - 145 mmol/L   Potassium 4.1 3.5 - 5.1 mmol/L   Chloride 107 98 - 111 mmol/L   CO2 22 22 - 32 mmol/L   Glucose, Bld 93 70 - 99 mg/dL   BUN 12 8 - 23 mg/dL   Creatinine, Ser 1.15 0.61 - 1.24 mg/dL   Calcium 9.2 8.9 - 10.3 mg/dL   Total Protein 7.8 6.5 - 8.1 g/dL   Albumin 3.3 (L) 3.5 - 5.0 g/dL   AST 50 (H) 15 - 41 U/L   ALT 41 0 - 44 U/L   Alkaline Phosphatase 102 38 - 126 U/L   Total Bilirubin 0.6 0.3 - 1.2 mg/dL   GFR, Estimated >60 >60 mL/min   Anion gap 10 5 - 15  CBC per protocol     Status: Abnormal   Collection Time: 08/11/21 11:58 AM  Result Value Ref Range   WBC 9.0 4.0 - 10.5 K/uL   RBC 4.54 4.22 - 5.81 MIL/uL   Hemoglobin 12.3 (L) 13.0 - 17.0 g/dL   HCT 41.2 39.0 - 52.0 %   MCV 90.7 80.0 - 100.0 fL   MCH 27.1 26.0 - 34.0 pg   MCHC 29.9 (L) 30.0 - 36.0 g/dL   RDW 15.7 (H) 11.5 - 15.5 %   Platelets 380 150 - 400 K/uL   nRBC 0.0 0.0 - 0.2 %   No results found for this or any previous visit (from the past 240 hour(s)).  Renal Function: Recent Labs    08/11/21 1158  CREATININE 1.15   Estimated Creatinine Clearance: 46.6 mL/min (by C-G formula based on SCr of 1.15 mg/dL).  Radiologic Imaging: No results found.  I independently reviewed the above imaging studies.  Assessment and Plan GABRIAL DOMINE is a 71 y.o. male with left ureteral stone and possible bladder lesion here for cysto, bladder biopsy, bilateral ureteroscopy with laser lithotripsy and basket extraction of stone, left PCN removal.   Matt R. Siddarth Hsiung MD 08/11/2021, 12:37 PM  [facility] Pager: [phone]):  [phone]

## 2021-08-11 NOTE — Anesthesia Preprocedure Evaluation (Addendum)
Anesthesia Evaluation  Patient identified by MRN, date of birth, ID band Patient awake    Reviewed: Allergy & Precautions, NPO status , Patient's Chart, lab work & pertinent test results  Airway Mallampati: II  TM Distance: >3 FB Neck ROM: Full    Dental  (+) Edentulous Upper, Dental Advisory Given   Pulmonary COPD, Current Smoker,    breath sounds clear to auscultation       Cardiovascular negative cardio ROS   Rhythm:Regular Rate:Normal     Neuro/Psych negative neurological ROS  negative psych ROS   GI/Hepatic GERD  Medicated,(+) Hepatitis -, C  Endo/Other  negative endocrine ROS  Renal/GU Renal disease     Musculoskeletal   Abdominal Normal abdominal exam  (+)   Peds  Hematology negative hematology ROS (+)   Anesthesia Other Findings   Reproductive/Obstetrics                            Anesthesia Physical Anesthesia Plan  ASA: 3  Anesthesia Plan: General   Post-op Pain Management:    Induction: Intravenous  PONV Risk Score and Plan: 2 and Ondansetron, Dexamethasone and Midazolam  Airway Management Planned: LMA  Additional Equipment: None  Intra-op Plan:   Post-operative Plan: Extubation in OR  Informed Consent: I have reviewed the patients History and Physical, chart, labs and discussed the procedure including the risks, benefits and alternatives for the proposed anesthesia with the patient or authorized representative who has indicated his/her understanding and acceptance.     Dental advisory given  Plan Discussed with: CRNA  Anesthesia Plan Comments:        Anesthesia Quick Evaluation

## 2021-08-11 NOTE — Discharge Instructions (Signed)
[facility] [phone] Post Ureteroscopy With or Without Stent Instructions  Definitions:  Ureter: The duct that transports urine from the kidney to the bladder. Stent:   A plastic hollow tube that is placed into the ureter, from the kidney to the bladder to prevent the ureter from swelling shut.  GENERAL INSTRUCTIONS:  Despite the fact that no skin incisions were used, the area around the ureter and bladder is raw and irritated. The stent is a foreign body which will further irritate the bladder wall. This irritation is manifested by increased frequency of urination, both day and night, and by an increase in the urge to urinate. In some, the urge to urinate is present almost always. Sometimes the urge is strong enough that you may not be able to stop yourself from urinating. The only real cure is to remove the stent and then give time for the bladder wall to heal which can't be done until the danger of the ureter swelling shut has passed, which varies.  You may see some blood in your urine while the stent is in place and a few days afterwards. Do not be alarmed, even if the urine was clear for a while. Get off your feet and drink lots of fluids until clearing occurs. If you start to pass clots or don't improve, call us.  DIET: You may return to your normal diet immediately. Because of the raw surface of your bladder, alcohol, spicy foods, acid type foods and drinks with caffeine may cause irritation or frequency and should be used in moderation. To keep your urine flowing freely and to avoid constipation, drink plenty of fluids during the day ( 8-10 glasses ). Tip: Avoid cranberry juice because it is very acidic.  ACTIVITY: Your physical activity doesn't need to be restricted. However, if you are very active, you may see some blood in your urine. We suggest that you reduce your activity under these circumstances until the bleeding has stopped.  BOWELS: It is important to  keep your bowels regular during the postoperative period. Straining with bowel movements can cause bleeding. A bowel movement every other day is reasonable. Use a mild laxative if needed, such as Milk of Magnesia 2-3 tablespoons, or 2 Dulcolax tablets. Call if you continue to have problems. If you have been taking narcotics for pain, before, during or after your surgery, you may be constipated. Take a laxative if necessary.   MEDICATION: You should resume your pre-surgery medications unless told not to. In addition you will often be given an antibiotic to prevent infection. These should be taken as prescribed until the bottles are finished unless you are having an unusual reaction to one of the drugs.  PROBLEMS YOU SHOULD REPORT TO [place]: Fevers over 100.5 Fahrenheit. Heavy bleeding, or clots ( See above notes about blood in urine ). Inability to urinate. Drug reactions ( hives, rash, nausea, vomiting, diarrhea ). Severe burning or pain with urination that is not improving.  FOLLOW-UP: You will need a follow-up appointment to monitor your progress. Call for this appointment at the number listed above. Usually the first appointment will be about three to fourteen days after your surgery.  You have a right ureteral stent draining your right kidney, foley catheter draining your bladder and a left nephrostomy tube. We will plan to remove all of these tubes in the office on Thursday AM.  I want you to clamp the left nephrostomy tube tomorrow AM and as long as no fevers or pain, we  will remove the tube on Thursday.

## 2021-08-11 NOTE — Anesthesia Postprocedure Evaluation (Addendum)
Anesthesia Post Note  Patient: XAVYER STEENSON  Procedure(s) Performed: CYSTOSCOPY/BILATERAL URETEROSCOPY/HOLMIUM LASER/RETROGRADE PYELOGRAMS/RIGHT STENT PLACEMENT (Urethra)     Patient location during evaluation: PACU Anesthesia Type: General Level of consciousness: awake and alert Pain management: pain level controlled Vital Signs Assessment: post-procedure vital signs reviewed and stable Respiratory status: spontaneous breathing, nonlabored ventilation, respiratory function stable and patient connected to nasal cannula oxygen Cardiovascular status: blood pressure returned to baseline and stable Postop Assessment: no apparent nausea or vomiting Anesthetic complications: no Comments: Multiple BP medications administered in PACU, pt to resume home meds after discharge to facility.    No notable events documented.  Last Vitals:  Vitals:   08/11/21 1700 08/11/21 1739  BP: (!) 182/102 (!) 176/101  Pulse: 81 76  Resp: 11 14  Temp: 36.7 C 36.7 C  SpO2: 91% 93%    Last Pain:  Vitals:   08/11/21 1739  TempSrc: Oral  PainSc: 0-No pain                 Effie Berkshire

## 2021-08-11 NOTE — Transfer of Care (Signed)
Immediate Anesthesia Transfer of Care Note  Patient: Ian Boyd  Procedure(s) Performed: CYSTOSCOPY/BILATERAL URETEROSCOPY/HOLMIUM LASER/RETROGRADE PYELOGRAMS/RIGHT STENT PLACEMENT (Urethra)  Patient Location: PACU  Anesthesia Type:General  Level of Consciousness: drowsy and patient cooperative  Airway & Oxygen Therapy: Patient Spontanous Breathing and Patient connected to face mask oxygen  Post-op Assessment: Report given to RN and Post -op Vital signs reviewed and stable  Post vital signs: Reviewed and stable  Last Vitals:  Vitals Value Taken Time  BP 172/101 08/11/21 1545  Temp    Pulse 79 08/11/21 1547  Resp 9 08/11/21 1547  SpO2 100 % 08/11/21 1547  Vitals shown include unvalidated device data.  Last Pain:  Vitals:   08/11/21 1147  TempSrc:   PainSc: 0-No pain         Complications: No notable events documented.

## 2021-08-12 ENCOUNTER — Emergency Department (HOSPITAL_COMMUNITY): Payer: Medicare Other

## 2021-08-12 ENCOUNTER — Other Ambulatory Visit: Payer: Self-pay

## 2021-08-12 ENCOUNTER — Encounter (HOSPITAL_COMMUNITY): Payer: Self-pay | Admitting: Urology

## 2021-08-12 ENCOUNTER — Inpatient Hospital Stay (HOSPITAL_COMMUNITY)
Admission: EM | Admit: 2021-08-12 | Discharge: 2021-08-20 | DRG: 853 | Disposition: A | Discharge status: Home Health | Payer: Medicare Other | Attending: Internal Medicine | Admitting: Internal Medicine

## 2021-08-12 DIAGNOSIS — N390 Urinary tract infection, site not specified: Secondary | ICD-10-CM | POA: Diagnosis present

## 2021-08-12 DIAGNOSIS — Z8619 Personal history of other infectious and parasitic diseases: Secondary | ICD-10-CM

## 2021-08-12 DIAGNOSIS — R110 Nausea: Secondary | ICD-10-CM | POA: Diagnosis not present

## 2021-08-12 DIAGNOSIS — J449 Chronic obstructive pulmonary disease, unspecified: Secondary | ICD-10-CM | POA: Diagnosis not present

## 2021-08-12 DIAGNOSIS — Z79899 Other long term (current) drug therapy: Secondary | ICD-10-CM

## 2021-08-12 DIAGNOSIS — N401 Enlarged prostate with lower urinary tract symptoms: Secondary | ICD-10-CM | POA: Diagnosis present

## 2021-08-12 DIAGNOSIS — R6520 Severe sepsis without septic shock: Secondary | ICD-10-CM | POA: Diagnosis present

## 2021-08-12 DIAGNOSIS — I499 Cardiac arrhythmia, unspecified: Secondary | ICD-10-CM | POA: Diagnosis not present

## 2021-08-12 DIAGNOSIS — R0602 Shortness of breath: Secondary | ICD-10-CM | POA: Diagnosis not present

## 2021-08-12 DIAGNOSIS — R636 Underweight: Secondary | ICD-10-CM | POA: Diagnosis present

## 2021-08-12 DIAGNOSIS — H409 Unspecified glaucoma: Secondary | ICD-10-CM | POA: Diagnosis present

## 2021-08-12 DIAGNOSIS — Z681 Body mass index (BMI) 19 or less, adult: Secondary | ICD-10-CM | POA: Diagnosis not present

## 2021-08-12 DIAGNOSIS — Z20822 Contact with and (suspected) exposure to covid-19: Secondary | ICD-10-CM | POA: Diagnosis present

## 2021-08-12 DIAGNOSIS — R000 Tachycardia, unspecified: Secondary | ICD-10-CM | POA: Diagnosis not present

## 2021-08-12 DIAGNOSIS — B1920 Unspecified viral hepatitis C without hepatic coma: Secondary | ICD-10-CM | POA: Diagnosis present

## 2021-08-12 DIAGNOSIS — I700 Atherosclerosis of aorta: Secondary | ICD-10-CM | POA: Diagnosis not present

## 2021-08-12 DIAGNOSIS — R338 Other retention of urine: Secondary | ICD-10-CM | POA: Diagnosis present

## 2021-08-12 DIAGNOSIS — N200 Calculus of kidney: Secondary | ICD-10-CM

## 2021-08-12 DIAGNOSIS — A4152 Sepsis due to Pseudomonas: Secondary | ICD-10-CM | POA: Diagnosis not present

## 2021-08-12 DIAGNOSIS — B965 Pseudomonas (aeruginosa) (mallei) (pseudomallei) as the cause of diseases classified elsewhere: Secondary | ICD-10-CM

## 2021-08-12 DIAGNOSIS — I10 Essential (primary) hypertension: Secondary | ICD-10-CM | POA: Diagnosis present

## 2021-08-12 DIAGNOSIS — R6889 Other general symptoms and signs: Secondary | ICD-10-CM | POA: Diagnosis not present

## 2021-08-12 DIAGNOSIS — N136 Pyonephrosis: Secondary | ICD-10-CM | POA: Diagnosis present

## 2021-08-12 DIAGNOSIS — N1330 Unspecified hydronephrosis: Secondary | ICD-10-CM | POA: Diagnosis not present

## 2021-08-12 DIAGNOSIS — M75101 Unspecified rotator cuff tear or rupture of right shoulder, not specified as traumatic: Secondary | ICD-10-CM | POA: Diagnosis present

## 2021-08-12 DIAGNOSIS — F17210 Nicotine dependence, cigarettes, uncomplicated: Secondary | ICD-10-CM | POA: Diagnosis present

## 2021-08-12 DIAGNOSIS — R7881 Bacteremia: Secondary | ICD-10-CM

## 2021-08-12 DIAGNOSIS — R918 Other nonspecific abnormal finding of lung field: Secondary | ICD-10-CM | POA: Diagnosis not present

## 2021-08-12 DIAGNOSIS — R0689 Other abnormalities of breathing: Secondary | ICD-10-CM | POA: Diagnosis not present

## 2021-08-12 DIAGNOSIS — Z743 Need for continuous supervision: Secondary | ICD-10-CM | POA: Diagnosis not present

## 2021-08-12 DIAGNOSIS — E876 Hypokalemia: Secondary | ICD-10-CM | POA: Diagnosis not present

## 2021-08-12 DIAGNOSIS — N3289 Other specified disorders of bladder: Secondary | ICD-10-CM | POA: Diagnosis present

## 2021-08-12 DIAGNOSIS — K219 Gastro-esophageal reflux disease without esophagitis: Secondary | ICD-10-CM | POA: Diagnosis present

## 2021-08-12 DIAGNOSIS — N132 Hydronephrosis with renal and ureteral calculous obstruction: Secondary | ICD-10-CM | POA: Diagnosis not present

## 2021-08-12 DIAGNOSIS — A419 Sepsis, unspecified organism: Secondary | ICD-10-CM | POA: Diagnosis not present

## 2021-08-12 DIAGNOSIS — K6812 Psoas muscle abscess: Secondary | ICD-10-CM | POA: Diagnosis present

## 2021-08-12 DIAGNOSIS — B182 Chronic viral hepatitis C: Secondary | ICD-10-CM | POA: Diagnosis not present

## 2021-08-12 DIAGNOSIS — N179 Acute kidney failure, unspecified: Secondary | ICD-10-CM | POA: Diagnosis not present

## 2021-08-12 DIAGNOSIS — R059 Cough, unspecified: Secondary | ICD-10-CM | POA: Diagnosis not present

## 2021-08-12 LAB — COMPREHENSIVE METABOLIC PANEL
ALT: 39 U/L (ref 0–44)
AST: 54 U/L — ABNORMAL HIGH (ref 15–41)
Albumin: 2.8 g/dL — ABNORMAL LOW (ref 3.5–5.0)
Alkaline Phosphatase: 83 U/L (ref 38–126)
Anion gap: 10 (ref 5–15)
BUN: 20 mg/dL (ref 8–23)
CO2: 22 mmol/L (ref 22–32)
Calcium: 8.4 mg/dL — ABNORMAL LOW (ref 8.9–10.3)
Chloride: 105 mmol/L (ref 98–111)
Creatinine, Ser: 1.79 mg/dL — ABNORMAL HIGH (ref 0.61–1.24)
GFR, Estimated: 40 mL/min — ABNORMAL LOW (ref >60)
Glucose, Bld: 119 mg/dL — ABNORMAL HIGH (ref 70–99)
Potassium: 4.1 mmol/L (ref 3.5–5.1)
Sodium: 137 mmol/L (ref 135–145)
Total Bilirubin: 0.7 mg/dL (ref 0.3–1.2)
Total Protein: 6.8 g/dL (ref 6.5–8.1)

## 2021-08-12 LAB — CBC WITH DIFFERENTIAL/PLATELET
Abs Immature Granulocytes: 0.24 10*3/uL — ABNORMAL HIGH (ref 0.00–0.07)
Basophils Absolute: 0 10*3/uL (ref 0.0–0.1)
Basophils Relative: 0 %
Eosinophils Absolute: 0 10*3/uL (ref 0.0–0.5)
Eosinophils Relative: 0 %
HCT: 34.5 % — ABNORMAL LOW (ref 39.0–52.0)
Hemoglobin: 11 g/dL — ABNORMAL LOW (ref 13.0–17.0)
Immature Granulocytes: 1 %
Lymphocytes Relative: 4 %
Lymphs Abs: 1 10*3/uL (ref 0.7–4.0)
MCH: 27.2 pg (ref 26.0–34.0)
MCHC: 31.9 g/dL (ref 30.0–36.0)
MCV: 85.2 fL (ref 80.0–100.0)
Monocytes Absolute: 2 10*3/uL — ABNORMAL HIGH (ref 0.1–1.0)
Monocytes Relative: 7 %
Neutro Abs: 23.5 10*3/uL — ABNORMAL HIGH (ref 1.7–7.7)
Neutrophils Relative %: 88 %
Platelets: 320 10*3/uL (ref 150–400)
RBC: 4.05 MIL/uL — ABNORMAL LOW (ref 4.22–5.81)
RDW: 15.9 % — ABNORMAL HIGH (ref 11.5–15.5)
WBC: 26.7 10*3/uL — ABNORMAL HIGH (ref 4.0–10.5)
nRBC: 0 % (ref 0.0–0.2)

## 2021-08-12 LAB — RESP PANEL BY RT-PCR (FLU A&B, COVID) ARPGX2
Influenza A by PCR: NEGATIVE
Influenza B by PCR: NEGATIVE
SARS Coronavirus 2 by RT PCR: NEGATIVE

## 2021-08-12 LAB — URINALYSIS, ROUTINE W REFLEX MICROSCOPIC
Bacteria, UA: NONE SEEN
Bilirubin Urine: NEGATIVE
Glucose, UA: NEGATIVE mg/dL
Ketones, ur: NEGATIVE mg/dL
Nitrite: NEGATIVE
Protein, ur: >=300 mg/dL — AB
RBC / HPF: >50 RBC/hpf — ABNORMAL HIGH (ref 0–5)
Specific Gravity, Urine: 1.017 (ref 1.005–1.030)
WBC, UA: >50 WBC/hpf — ABNORMAL HIGH (ref 0–5)
pH: 5 (ref 5.0–8.0)

## 2021-08-12 LAB — LACTIC ACID, PLASMA
Lactic Acid, Venous: 4.3 mmol/L (ref 0.5–1.9)
Lactic Acid, Venous: 3.4 mmol/L (ref 0.5–1.9)
Lactic Acid, Venous: 4 mmol/L (ref 0.5–1.9)

## 2021-08-12 LAB — LIPASE, BLOOD: Lipase: 23 U/L (ref 11–51)

## 2021-08-12 LAB — PROTIME-INR
INR: 1.2 (ref 0.8–1.2)
Prothrombin Time: 15 seconds (ref 11.4–15.2)

## 2021-08-12 LAB — APTT: aPTT: 36 seconds (ref 24–36)

## 2021-08-12 MED ORDER — DORZOLAMIDE HCL-TIMOLOL MAL 2-0.5 % OP SOLN
1.0000 [drp] | Freq: Two times a day (BID) | OPHTHALMIC | Status: DC
  Administered 2021-08-12 – 2021-08-20 (×16): 1 [drp] via OPHTHALMIC
  Filled 2021-08-12: qty 10

## 2021-08-12 MED ORDER — SODIUM CHLORIDE 0.9 % IV SOLN: 2.0000 g | INTRAVENOUS | Status: DC

## 2021-08-12 MED ORDER — ALBUTEROL SULFATE (SENSOR) 108 (90 BASE) MCG/ACT IN AEPB: 1.0000 | INHALATION_SPRAY | Freq: Every day | RESPIRATORY_TRACT | Status: DC | PRN

## 2021-08-12 MED ORDER — ACETAMINOPHEN 650 MG RE SUPP: 650.0000 mg | Freq: Four times a day (QID) | RECTAL | Status: DC | PRN

## 2021-08-12 MED ORDER — UMECLIDINIUM BROMIDE 62.5 MCG/ACT IN AEPB
1.0000 | INHALATION_SPRAY | Freq: Every day | RESPIRATORY_TRACT | Status: DC
  Administered 2021-08-13 – 2021-08-20 (×8): 1 via RESPIRATORY_TRACT
  Filled 2021-08-12 (×2): qty 7

## 2021-08-12 MED ORDER — LATANOPROST 0.005 % OP SOLN
1.0000 [drp] | Freq: Every day | OPHTHALMIC | Status: DC
  Administered 2021-08-12 – 2021-08-19 (×8): 1 [drp] via OPHTHALMIC
  Filled 2021-08-12: qty 2.5

## 2021-08-12 MED ORDER — ENSURE ENLIVE PO LIQD
237.0000 mL | Freq: Two times a day (BID) | ORAL | Status: DC
  Administered 2021-08-13 – 2021-08-20 (×9): 237 mL via ORAL

## 2021-08-12 MED ORDER — LACTATED RINGERS IV BOLUS
1000.0000 mL | Freq: Once | INTRAVENOUS | Status: AC
  Administered 2021-08-12: 1000 mL via INTRAVENOUS

## 2021-08-12 MED ORDER — VANCOMYCIN VARIABLE DOSE PER UNSTABLE RENAL FUNCTION (PHARMACIST DOSING): Status: DC

## 2021-08-12 MED ORDER — SODIUM CHLORIDE 0.9 % IV SOLN: 1.0000 g | INTRAVENOUS | Status: DC

## 2021-08-12 MED ORDER — LATANOPROSTENE BUNOD 0.024 % OP SOLN: 1.0000 [drp] | Freq: Every day | OPHTHALMIC | Status: DC

## 2021-08-12 MED ORDER — ACETAMINOPHEN 325 MG PO TABS
650.0000 mg | ORAL_TABLET | Freq: Four times a day (QID) | ORAL | Status: DC | PRN
  Administered 2021-08-13 – 2021-08-20 (×6): 650 mg via ORAL
  Filled 2021-08-12 (×6): qty 2

## 2021-08-12 MED ORDER — TIOTROPIUM BROMIDE MONOHYDRATE 2.5 MCG/ACT IN AERS: 1.0000 | INHALATION_SPRAY | Freq: Every day | RESPIRATORY_TRACT | Status: DC

## 2021-08-12 MED ORDER — VANCOMYCIN HCL IN DEXTROSE 1-5 GM/200ML-% IV SOLN
1000.0000 mg | Freq: Once | INTRAVENOUS | Status: AC
  Administered 2021-08-12: 1000 mg via INTRAVENOUS
  Filled 2021-08-12: qty 200

## 2021-08-12 MED ORDER — ALBUTEROL SULFATE (2.5 MG/3ML) 0.083% IN NEBU
2.5000 mg | INHALATION_SOLUTION | Freq: Every day | RESPIRATORY_TRACT | Status: DC | PRN
Start: 2021-08-12 — End: 2021-08-15

## 2021-08-12 MED ORDER — LACTATED RINGERS IV SOLN: INTRAVENOUS | Status: DC

## 2021-08-12 MED ORDER — LACTATED RINGERS IV BOLUS
1000.0000 mL | Freq: Once | INTRAVENOUS | Status: AC
Start: 2021-08-12 — End: 2021-08-12
  Administered 2021-08-12: 1000 mL via INTRAVENOUS

## 2021-08-12 MED ORDER — SODIUM CHLORIDE 0.9 % IV SOLN: INTRAVENOUS | Status: DC

## 2021-08-12 MED ORDER — DOCUSATE SODIUM 100 MG PO CAPS
100.0000 mg | ORAL_CAPSULE | Freq: Every day | ORAL | Status: DC | PRN
  Administered 2021-08-20: 100 mg via ORAL
  Filled 2021-08-12: qty 1

## 2021-08-12 MED ORDER — HEPARIN SODIUM (PORCINE) 5000 UNIT/ML IJ SOLN
5000.0000 [IU] | Freq: Three times a day (TID) | INTRAMUSCULAR | Status: DC
  Administered 2021-08-12 – 2021-08-20 (×24): 5000 [IU] via SUBCUTANEOUS
  Filled 2021-08-12 (×24): qty 1

## 2021-08-12 MED ORDER — SODIUM CHLORIDE 0.9 % IV SOLN
1.0000 g | Freq: Once | INTRAVENOUS | Status: AC
  Administered 2021-08-12: 1 g via INTRAVENOUS
  Filled 2021-08-12: qty 10

## 2021-08-12 MED ORDER — NETARSUDIL DIMESYLATE 0.02 % OP SOLN: 1.0000 [drp] | Freq: Every day | OPHTHALMIC | Status: DC

## 2021-08-12 MED ORDER — TAMSULOSIN HCL 0.4 MG PO CAPS
0.4000 mg | ORAL_CAPSULE | Freq: Every day | ORAL | Status: DC
  Administered 2021-08-12 – 2021-08-19 (×8): 0.4 mg via ORAL
  Filled 2021-08-12 (×8): qty 1

## 2021-08-12 NOTE — H&P (Signed)
History and Physical    Ian Boyd XVQ:[id number] DOB: 1950-04-09 DOA: 08/12/2021  I have briefly reviewed the patient's prior medical records in [place]  PCP: Pleas Koch Virgina Evener, MD  Patient coming from: SNF  Chief Complaint: fever  HPI: Ian Boyd is a 71 y.o. male with medical history significant of hypertension, recent psoas abscess, COPD, nephrolithiasis, comes into the hospital from the SNF due to fever and tachycardia.  He was recently hospitalized in April 2023 with E. 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  He had a drain placed at that time, and ID followed patient while hospitalized.  Eventually improved, follow-up with ID as an outpatient about 3 weeks ago, and was supposed to stay on cefadroxil until 5/25.  Saw urology yesterday for his nephrolithiasis, underwent right uteroscopy with laser lithotripsy, stone extraction, bladder stone extraction and right ureteral stent placement.  Patient currently states that he feels well and not sure why he was brought to the hospital.  Reports an episode of few diarrheal bowel movements this morning but nothing since.  He denies any abdominal pain, denies any nausea or vomiting.  No chest pain, no shortness of breath.  No lightheadedness or dizziness.  ED Course: In the ED he was found to be febrile 100.2, tachypneic with respiratory rate into the low 20s, tachycardic into the 120s.  He is normotensive.  He satting well on room air.  Blood work reveals a creatinine of 1.8, lactic acid 4.3 and he has a white count of 26.7 K.  COVID and flu are negative.  CT of the abdomen and pelvis showed bilateral nonobstructive renal stones, and inflammatory stranding around the cecum and ascending colon of unclear etiology concerning for acute uncomplicated diverticulitis.  UA with pyuria.  He was given ceftriaxone and we are asked to admit.  Urology consulted  Review of  Systems: All systems reviewed, and apart from HPI, all negative  Past Medical History:  Diagnosis Date   Acid reflux    Blind right eye    BPH (benign prostatic hyperplasia)    COPD (chronic obstructive pulmonary disease) ([place])    Drug abuse ([facility])    Glaucoma    Hepatitis C    History of severe sepsis    Hyponatremia    Psoas abscess, right ([place]) 06/2021   Rotator cuff tear    Chronic right   Sepsis ([place]) 06/2021   E coli bacteremia from a urinary source complicated by obstructive ureteral calculus   Thrombocytopenia ([place])     Past Surgical History:  Procedure Laterality Date   CATARACT EXTRACTION Left    CYSTOSCOPY/URETEROSCOPY/HOLMIUM LASER/STENT PLACEMENT N/A 08/11/2021   Procedure: CYSTOSCOPY/BILATERAL URETEROSCOPY/HOLMIUM LASER/RETROGRADE PYELOGRAMS/RIGHT STENT PLACEMENT;  Surgeon: Janith Lima, MD;  Location: [facility];  Service: Urology;  Laterality: N/A;   ESOPHAGEAL DILATION N/A 05/12/2018   Procedure: ESOPHAGEAL DILATION;  Surgeon: Rogene Houston, MD;  Location: [facility];  Service: Endoscopy;  Laterality: N/A;   ESOPHAGOGASTRODUODENOSCOPY N/A 05/12/2018   Procedure: ESOPHAGOGASTRODUODENOSCOPY (EGD);  Surgeon: Rogene Houston, MD;  Location: [facility];  Service: Endoscopy;  Laterality: N/A;  2:45   IR NEPHROSTOMY PLACEMENT LEFT  06/26/2021   IR RADIOLOGIST EVAL & MGMT  07/16/2021     reports that he has been smoking cigarettes. He has been smoking an average of .5 packs per day. He has never used smokeless tobacco. He reports that he does  not currently use drugs after having used the following drugs: Cocaine and Marijuana. He reports that he does not drink alcohol.  No Known Allergies  History reviewed. No pertinent family history.  Prior to Admission medications   Medication Sig Start Date End Date Taking? Authorizing Provider  Albuterol Sulfate, sensor, (PROAIR DIGIHALER) 108 (90 Base) MCG/ACT AEPB Inhale 1 puff into the lungs daily as needed (shortness of  breath).   Yes [provider]  amLODipine (NORVASC) 5 MG tablet Take 5 mg by mouth daily. 08/11/21  Yes [provider]  cefadroxil (DURICEF) 500 MG capsule Take 2 capsules (1,000 mg total) by mouth 2 (two) times daily. 07/03/21  Yes Cherene Altes, MD  cephALEXin (KEFLEX) 500 MG capsule Take 1 capsule (500 mg total) by mouth 2 (two) times daily for 6 doses. 08/11/21 08/14/21 Yes Janith Lima, MD  docusate sodium (COLACE) 100 MG capsule Take 1 capsule (100 mg total) by mouth daily as needed. Patient taking differently: Take 100 mg by mouth daily as needed for mild constipation. 08/11/21 09/10/21 Yes Janith Lima, MD  dorzolamide-timolol (COSOPT) 22.3-6.8 MG/ML ophthalmic solution Place 1 drop into the left eye 2 (two) times daily. (0900 & 2100) 05/05/21  Yes [provider]  INCRUSE ELLIPTA 62.5 MCG/ACT AEPB Inhale 1 puff into the lungs in the morning. 07/23/21  Yes [provider]  Nutritional Supplements (NUTRITIONAL SUPPLEMENT PO) Take 120 mLs by mouth in the morning, at noon, and at bedtime. Medpass (0800,1300 & 1700)   Yes [provider]  oxyCODONE-acetaminophen (PERCOCET) 5-325 MG tablet Take 1 tablet by mouth every 4 (four) hours as needed for severe pain. 08/11/21  Yes Janith Lima, MD  pantoprazole (PROTONIX) 40 MG tablet TAKE 1 TABLET (40 MG TOTAL) BY MOUTH 2 (TWO) TIMES DAILY BEFORE A MEAL. 08/04/18  Yes Rehman, Mechele Dawley, MD  RHOPRESSA 0.02 % SOLN Place 1 drop into the left eye at bedtime. (2100) 05/05/21  Yes [provider]  SPIRIVA RESPIMAT 2.5 MCG/ACT AERS Take 1 Inhaler by mouth daily. 05/10/21  Yes [provider]  tamsulosin (FLOMAX) 0.4 MG CAPS capsule Take 0.4 mg by mouth at bedtime. (2100) 04/17/21  Yes [provider]  VYZULTA 0.024 % SOLN Place 1 drop into the left eye at bedtime. (2100) 05/05/21  Yes [provider]    Physical Exam: Vitals:   08/12/21 1630 08/12/21 1645 08/12/21 1715 08/12/21  1730  BP: 115/62 113/68 101/61 108/63  Pulse: (!) 124 (!) 123 (!) 123 (!) 122  Resp: 19 (!) '23 20 19  '$ Temp:      TempSrc:      SpO2: 100% 98% 95% 95%  Weight:      Height:          Constitutional: NAD, calm, comfortable Eyes: PERRL, lids and conjunctivae normal ENMT: Mucous membranes are moist. Posterior pharynx clear of any exudate or lesions.Normal dentition.  Neck: normal, supple Respiratory: clear to auscultation bilaterally, no wheezing, no crackles. Normal respiratory effort. No accessory muscle use.  Cardiovascular: Regular rate and rhythm, no murmurs / rubs / gallops. No extremity edema.  Tachycardic Abdomen: no tenderness, no masses palpated. Bowel sounds positive.  Musculoskeletal: no clubbing / cyanosis. Normal muscle tone.  Skin: no rashes, lesions, ulcers. No induration Neurologic: Nonfocal, equal strength  Labs on Admission: I have personally reviewed following labs and imaging studies  CBC: Recent Labs  Lab 08/11/21 1158 08/12/21 1552  WBC 9.0 26.7*  NEUTROABS  --  23.5*  HGB 12.3* 11.0*  HCT 41.2 34.5*  MCV 90.7 85.2  PLT 380 254   Basic Metabolic Panel: Recent Labs  Lab 08/11/21 1158 08/12/21 1552  NA 139 137  K 4.1 4.1  CL 107 105  CO2 22 22  GLUCOSE 93 119*  BUN 12 20  CREATININE 1.15 1.79*  CALCIUM 9.2 8.4*   Liver Function Tests: Recent Labs  Lab 08/11/21 1158 08/12/21 1552  AST 50* 54*  ALT 41 39  ALKPHOS 102 83  BILITOT 0.6 0.7  PROT 7.8 6.8  ALBUMIN 3.3* 2.8*   Coagulation Profile: Recent Labs  Lab 08/12/21 1601  INR 1.2   BNP (last 3 results) No results for input(s): PROBNP in the last 8760 hours. CBG: No results for input(s): GLUCAP in the last 168 hours. Thyroid Function Tests: No results for input(s): TSH, T4TOTAL, FREET4, T3FREE, THYROIDAB in the last 72 hours. Urine analysis:    Component Value Date/Time   COLORURINE AMBER (A) 08/12/2021 1625   APPEARANCEUR TURBID (A) 08/12/2021 1625   LABSPEC 1.017  08/12/2021 1625   PHURINE 5.0 08/12/2021 1625   GLUCOSEU NEGATIVE 08/12/2021 1625   HGBUR LARGE (A) 08/12/2021 1625   BILIRUBINUR NEGATIVE 08/12/2021 1625   KETONESUR NEGATIVE 08/12/2021 1625   PROTEINUR >=300 (A) 08/12/2021 1625   NITRITE NEGATIVE 08/12/2021 1625   LEUKOCYTESUR MODERATE (A) 08/12/2021 1625     Radiological Exams on Admission: CT ABDOMEN PELVIS WO CONTRAST  Result Date: 08/12/2021 CLINICAL DATA:  Nephrostomy tube dislodgement, recent procedure, concern for complication and/or sepsis. EXAM: CT ABDOMEN AND PELVIS WITHOUT CONTRAST TECHNIQUE: Multidetector CT imaging of the abdomen and pelvis was performed following the standard protocol without IV contrast. RADIATION DOSE REDUCTION: This exam was performed according to the departmental dose-optimization program which includes automated exposure control, adjustment of the mA and/or kV according to patient size and/or use of iterative reconstruction technique. COMPARISON:  CT July 16, 2021. FINDINGS: Lower chest: Hypoventilatory change in the lung bases. Hepatobiliary: Hepatic cysts. Gallbladder is unremarkable. No biliary ductal dilation. Pancreas: No pancreatic ductal dilation or evidence of acute inflammation. Spleen: No splenomegaly or focal splenic lesion. Adrenals/Urinary Tract: Bilateral adrenal glands are unremarkable. Left percutaneous nephrostomy tube with pigtail coiled in the renal pelvis. No hydronephrosis. Nonobstructive left renal calculi measure up to 2 mm. Left nephroureterostomy tube with pigtails coiled in the renal pelvis and urinary bladder without hydronephrosis. Nonobstructive right renal stones measure up to 4 mm. Gas in the urinary bladder which is decompressed around a Foley catheter. Stomach/Bowel: No radiopaque enteric contrast material was administered. Stomach is nondistended limiting evaluation. No pathologic dilation of small or large bowel. Right-sided colonic diverticulosis with inflammatory stranding  along the cecum/ascending colon. Noninflamed appendix is visualized. Vascular/Lymphatic: Aortic and branch vessel atherosclerosis without abdominal aortic aneurysm. No pathologically enlarged abdominal or pelvic lymph nodes within the limitation of no intravenous contrast material and relative paucity of peritoneal fat. Reproductive: Enlarged prostate gland. Other: No walled off fluid collections. No pneumoperitoneum. Small bilateral inguinal hernias both containing fat and a short segment of nonobstructed bowel. Musculoskeletal: Multilevel degenerative changes spine most severe at L4-L5 marked discogenic disease and Modic type endplate changes. IMPRESSION: 1. There is inflammatory stranding along the cecum and ascending colon the etiology of which is difficult to determine given the relative paucity of abdominal fat and lack of intravenous contrast material. There is no significant right-sided perinephric stranding and the appendix appears normal. However, there are right-sided colonic diverticula, as such these findings are most compatible with  acute uncomplicated diverticulitis. 2. Left percutaneous nephrostomy tube with pigtails coiled in the renal pelvis and urinary bladder without hydronephrosis. 3. Left nephroureterostomy tube with pigtails coiled in the renal pelvis and urinary bladder without hydronephrosis. 4. Bilateral nonobstructive renal stones. 5. Small bilateral inguinal hernias both containing fat and a short segment of nonobstructed bowel. 6.  Aortic Atherosclerosis (ICD10-I70.0). Electronically Signed   By: Dahlia Bailiff M.D.   On: 08/12/2021 16:31   DG Chest Portable 1 View  Result Date: 08/12/2021 CLINICAL DATA:  Cough EXAM: PORTABLE CHEST 1 VIEW COMPARISON:  06/25/2021 FINDINGS: Cardiac size is within normal limits. Thoracic aorta is tortuous. There are no signs of pulmonary edema or focal pulmonary consolidation. There is no pleural effusion or pneumothorax. IMPRESSION: No active disease.  Electronically Signed   By: Elmer Picker M.D.   On: 08/12/2021 16:13   DG C-Arm 1-60 Min-No Report  Result Date: 08/11/2021 Fluoroscopy was utilized by the requesting physician.  No radiographic interpretation.   DG C-Arm 1-60 Min-No Report  Result Date: 08/11/2021 Fluoroscopy was utilized by the requesting physician.  No radiographic interpretation.   DG C-Arm 1-60 Min-No Report  Result Date: 08/11/2021 Fluoroscopy was utilized by the requesting physician.  No radiographic interpretation.    EKG: Independently reviewed.  Sinus tachycardia  Assessment/Plan Principal problem Sepsis due to presumed urinary source-patient presents 24 hours after instrumentation with elevated WBC, fever, tachycardia and pyuria.  Blood cultures and urine cultures were sent.  He was given ceftriaxone in the ED, add vancomycin for gram-positive coverage given recent instrumentation.  He was already on oral antibiotics at his SNF so cultures may not grow anything necessarily.  CT scan raise suspicion for diverticulitis however he has no abdominal pain and his exam is unremarkable.  Urology consulted  Active problems Nephrolithiasis/bladder stone, left kidney hydronephrosis-patient just underwent cystoscopy yesterday with laser lithotripsy on the right and extraction of stones, he also had a right ureteral stent placement.  Also underwent a cystoscopy which showed bladder diverticuli.  He also has a left percutaneous nephrostomy tube, initial concern was that it got dislodged earlier today but the CT scan shows it in the right place  Essential hypertension-hold blood pressure medications due to sepsis physiology  Acute kidney injury-creatinine 1.8, likely in the setting of sepsis.  IV fluids, repeat renal function in the morning  Urinary retention-continue home medications  COPD-stable, no wheezing, continue home inhalers  Recent psoas abscess, recent E. coli bacteremia-was already on antibiotics as an  outpatient for 2 additional days with plan to end on 5/25.  ID follow-up as an outpatient.  Right rotator cuff tear-chronic, has been going on for the past 3 years  History of hep C-ID will follow as an outpatient  DVT prophylaxis: heparin  Code Status: Full code  Family Communication: no family at bedside  Disposition Plan: back to SNF Bed Type: progressive Consults called: Urology   Obs/Inp: Inpatient  At the time of admission, it appears that the appropriate admission status for this patient is INPATIENT as it is expected that patient will require hospital care > 2 midnights. This is judged to be reasonable and necessary in order to provide the required intensity of service to ensure the patient's safety given: presenting symptoms, initial radiographic and laboratory data and in the context of their chronic comorbidities. Together, these circumstances are felt to place patient at high at high risk for further clinical deterioration threatening life, limb, or organ.  Marzetta Board, MD, PhD [facility]  Contact via [email or web address]  08/12/2021, 5:47 PM

## 2021-08-12 NOTE — ED Provider Notes (Signed)
[facility] DEPT Provider Note   CSN: [id number] Arrival date & time: 08/12/21  74     History  Chief Complaint  Patient presents with   Post-op Problem    Ian Boyd is a 71 y.o. male.  The history is provided by the patient, medical records and the EMS personnel. No language interpreter was used.  Illness Location:  Gen fatigue chills cough and diarrhea Severity:  Moderate Onset quality:  Gradual Timing:  Constant Progression:  Waxing and waning Chronicity:  New Associated symptoms: cough, diarrhea, fatigue and fever   Associated symptoms: no abdominal pain, no chest pain, no congestion, no headaches, no loss of consciousness, no nausea, no shortness of breath, no vomiting and no wheezing       Home Medications Prior to Admission medications   Medication Sig Start Date End Date Taking? Authorizing Provider  Albuterol Sulfate, sensor, (PROAIR DIGIHALER) 108 (90 Base) MCG/ACT AEPB Inhale 1 puff into the lungs daily as needed (shortness of breath).    [provider]  cefadroxil (DURICEF) 500 MG capsule Take 2 capsules (1,000 mg total) by mouth 2 (two) times daily. 07/03/21   Cherene Altes, MD  cephALEXin (KEFLEX) 500 MG capsule Take 1 capsule (500 mg total) by mouth 2 (two) times daily for 6 doses. 08/11/21 08/14/21  Janith Lima, MD  docusate sodium (COLACE) 100 MG capsule Take 1 capsule (100 mg total) by mouth daily as needed. 08/11/21 09/10/21  Janith Lima, MD  dorzolamide-timolol (COSOPT) 22.3-6.8 MG/ML ophthalmic solution Place 1 drop into the left eye 2 (two) times daily. (0900 & 2100) 05/05/21   [provider]  ibuprofen (ADVIL) 600 MG tablet Take 600 mg by mouth every 6 (six) hours as needed for moderate pain. 06/19/21   [provider]  INCRUSE ELLIPTA 62.5 MCG/ACT AEPB Inhale 1 puff into the lungs in the morning. 07/23/21   [provider]  Nutritional Supplements (NUTRITIONAL SUPPLEMENT PO)  Take 120 mLs by mouth in the morning, at noon, and at bedtime. Medpass (0800,1300 & 1700)    [provider]  oxyCODONE-acetaminophen (PERCOCET) 5-325 MG tablet Take 1 tablet by mouth every 4 (four) hours as needed for severe pain. 08/11/21   Janith Lima, MD  pantoprazole (PROTONIX) 40 MG tablet TAKE 1 TABLET (40 MG TOTAL) BY MOUTH 2 (TWO) TIMES DAILY BEFORE A MEAL. 08/04/18   Rehman, Mechele Dawley, MD  polyethylene glycol (MIRALAX / GLYCOLAX) 17 g packet Take 17 g by mouth daily as needed (constipation.).    [provider]  RHOPRESSA 0.02 % SOLN Place 1 drop into the left eye at bedtime. (2100) 05/05/21   [provider]  tamsulosin (FLOMAX) 0.4 MG CAPS capsule Take 0.4 mg by mouth at bedtime. (2100) 04/17/21   [provider]  VYZULTA 0.024 % SOLN Place 1 drop into the left eye at bedtime. (2100) 05/05/21   [provider]      Allergies    Patient has no known allergies.    Review of Systems   Review of Systems  Constitutional:  Positive for chills, fatigue and fever.  HENT:  Negative for congestion.   Eyes:  Negative for visual disturbance.  Respiratory:  Positive for cough. Negative for chest tightness, shortness of breath and wheezing.   Cardiovascular:  Negative for chest pain.  Gastrointestinal:  Positive for diarrhea. Negative for abdominal pain, constipation, nausea and vomiting.  Genitourinary:  Negative for dysuria and flank pain.  Musculoskeletal:  Negative for back pain, neck pain and neck stiffness.  Skin:  Positive for wound (nephrostomy tube).  Neurological:  Negative for loss of consciousness, weakness, light-headedness, numbness and headaches.  Psychiatric/Behavioral:  Negative for agitation and confusion.    Physical Exam Updated Vital Signs BP 114/66 (BP Location: Left Arm)   Pulse (!) 124   Temp 100.2 F (37.9 C) (Oral)   Resp (!) 26   Ht 6' (1.829 m)   Wt 57.6 kg   SpO2 98%   BMI 17.22 kg/m  Physical Exam Vitals and  nursing note reviewed.  Constitutional:      General: He is not in acute distress.    Appearance: He is well-developed.  HENT:     Head: Normocephalic and atraumatic.     Nose: Nose normal.     Mouth/Throat:     Mouth: Mucous membranes are moist.  Eyes:     Conjunctiva/sclera: Conjunctivae normal.  Cardiovascular:     Rate and Rhythm: Normal rate and regular rhythm.     Heart sounds: No murmur heard. Pulmonary:     Effort: Pulmonary effort is normal. No respiratory distress.     Breath sounds: Rhonchi present. No wheezing or rales.  Chest:     Chest wall: No tenderness.  Abdominal:     General: There is no distension.     Palpations: Abdomen is soft.     Tenderness: There is no abdominal tenderness. There is no guarding or rebound.  Musculoskeletal:        General: No swelling or tenderness.     Cervical back: Neck supple. No tenderness.       Back:     Right lower leg: No edema.     Left lower leg: No edema.  Skin:    General: Skin is warm and dry.     Capillary Refill: Capillary refill takes less than 2 seconds.     Findings: No erythema.  Neurological:     Mental Status: He is alert.  Psychiatric:        Mood and Affect: Mood normal.    ED Results / Procedures / Treatments   Labs (all labs ordered are listed, but only abnormal results are displayed) Labs Reviewed  CBC WITH DIFFERENTIAL/PLATELET - Abnormal; Notable for the following components:      Result Value   WBC 26.7 (*)    RBC 4.05 (*)    Hemoglobin 11.0 (*)    HCT 34.5 (*)    RDW 15.9 (*)    Neutro Abs 23.5 (*)    Monocytes Absolute 2.0 (*)    Abs Immature Granulocytes 0.24 (*)    All other components within normal limits  COMPREHENSIVE METABOLIC PANEL - Abnormal; Notable for the following components:   Glucose, Bld 119 (*)    Creatinine, Ser 1.79 (*)    Calcium 8.4 (*)    Albumin 2.8 (*)    AST 54 (*)    GFR, Estimated 40 (*)    All other components within normal limits  LACTIC ACID, PLASMA -  Abnormal; Notable for the following components:   Lactic Acid, Venous 4.3 (*)    All other components within normal limits  LACTIC ACID, PLASMA - Abnormal; Notable for the following components:   Lactic Acid, Venous 3.4 (*)    All other components within normal limits  URINALYSIS, ROUTINE W REFLEX MICROSCOPIC - Abnormal; Notable for the following components:   Color, Urine AMBER (*)    APPearance TURBID (*)  Hgb urine dipstick LARGE (*)    Protein, ur >=300 (*)    Leukocytes,Ua MODERATE (*)    RBC / HPF >50 (*)    WBC, UA >50 (*)    All other components within normal limits  RESP PANEL BY RT-PCR (FLU A&B, COVID) ARPGX2  CULTURE, BLOOD (ROUTINE X 2)  CULTURE, BLOOD (ROUTINE X 2)  URINE CULTURE  LIPASE, BLOOD  PROTIME-INR  APTT  CBC  CREATININE, SERUM  COMPREHENSIVE METABOLIC PANEL  CBC    EKG EKG Interpretation  Date/Time:  Tuesday Aug 12 2021 15:40:25 EDT Ventricular Rate:  126 PR Interval:  165 QRS Duration: 76 QT Interval:  291 QTC Calculation: 422 R Axis:   76 Text Interpretation: Sinus tachycardia Borderline T wave abnormalities when compared to prior, faster rate. No STEMI Confirmed by Antony Blackbird [phone]) on 08/12/2021 4:54:33 PM  Radiology CT ABDOMEN PELVIS WO CONTRAST  Result Date: 08/12/2021 CLINICAL DATA:  Nephrostomy tube dislodgement, recent procedure, concern for complication and/or sepsis. EXAM: CT ABDOMEN AND PELVIS WITHOUT CONTRAST TECHNIQUE: Multidetector CT imaging of the abdomen and pelvis was performed following the standard protocol without IV contrast. RADIATION DOSE REDUCTION: This exam was performed according to the departmental dose-optimization program which includes automated exposure control, adjustment of the mA and/or kV according to patient size and/or use of iterative reconstruction technique. COMPARISON:  CT July 16, 2021. FINDINGS: Lower chest: Hypoventilatory change in the lung bases. Hepatobiliary: Hepatic cysts. Gallbladder is  unremarkable. No biliary ductal dilation. Pancreas: No pancreatic ductal dilation or evidence of acute inflammation. Spleen: No splenomegaly or focal splenic lesion. Adrenals/Urinary Tract: Bilateral adrenal glands are unremarkable. Left percutaneous nephrostomy tube with pigtail coiled in the renal pelvis. No hydronephrosis. Nonobstructive left renal calculi measure up to 2 mm. Left nephroureterostomy tube with pigtails coiled in the renal pelvis and urinary bladder without hydronephrosis. Nonobstructive right renal stones measure up to 4 mm. Gas in the urinary bladder which is decompressed around a Foley catheter. Stomach/Bowel: No radiopaque enteric contrast material was administered. Stomach is nondistended limiting evaluation. No pathologic dilation of small or large bowel. Right-sided colonic diverticulosis with inflammatory stranding along the cecum/ascending colon. Noninflamed appendix is visualized. Vascular/Lymphatic: Aortic and branch vessel atherosclerosis without abdominal aortic aneurysm. No pathologically enlarged abdominal or pelvic lymph nodes within the limitation of no intravenous contrast material and relative paucity of peritoneal fat. Reproductive: Enlarged prostate gland. Other: No walled off fluid collections. No pneumoperitoneum. Small bilateral inguinal hernias both containing fat and a short segment of nonobstructed bowel. Musculoskeletal: Multilevel degenerative changes spine most severe at L4-L5 marked discogenic disease and Modic type endplate changes. IMPRESSION: 1. There is inflammatory stranding along the cecum and ascending colon the etiology of which is difficult to determine given the relative paucity of abdominal fat and lack of intravenous contrast material. There is no significant right-sided perinephric stranding and the appendix appears normal. However, there are right-sided colonic diverticula, as such these findings are most compatible with acute uncomplicated  diverticulitis. 2. Left percutaneous nephrostomy tube with pigtails coiled in the renal pelvis and urinary bladder without hydronephrosis. 3. Left nephroureterostomy tube with pigtails coiled in the renal pelvis and urinary bladder without hydronephrosis. 4. Bilateral nonobstructive renal stones. 5. Small bilateral inguinal hernias both containing fat and a short segment of nonobstructed bowel. 6.  Aortic Atherosclerosis (ICD10-I70.0). Electronically Signed   By: Dahlia Bailiff M.D.   On: 08/12/2021 16:31   DG Chest Portable 1 View  Result Date: 08/12/2021 CLINICAL DATA:  Cough EXAM: PORTABLE  CHEST 1 VIEW COMPARISON:  06/25/2021 FINDINGS: Cardiac size is within normal limits. Thoracic aorta is tortuous. There are no signs of pulmonary edema or focal pulmonary consolidation. There is no pleural effusion or pneumothorax. IMPRESSION: No active disease. Electronically Signed   By: Elmer Picker M.D.   On: 08/12/2021 16:13   DG C-Arm 1-60 Min-No Report  Result Date: 08/11/2021 Fluoroscopy was utilized by the requesting physician.  No radiographic interpretation.   DG C-Arm 1-60 Min-No Report  Result Date: 08/11/2021 Fluoroscopy was utilized by the requesting physician.  No radiographic interpretation.   DG C-Arm 1-60 Min-No Report  Result Date: 08/11/2021 Fluoroscopy was utilized by the requesting physician.  No radiographic interpretation.    Procedures Procedures    CRITICAL CARE Performed by: Gwenyth Allegra Jazmin Ley Total critical care time: 45 minutes Critical care time was exclusive of separately billable procedures and treating other patients. Critical care was necessary to treat or prevent imminent or life-threatening deterioration. Critical care was time spent personally by me on the following activities: development of treatment plan with patient and/or surrogate as well as nursing, discussions with consultants, evaluation of patient's response to treatment, examination of patient,  obtaining history from patient or surrogate, ordering and performing treatments and interventions, ordering and review of laboratory studies, ordering and review of radiographic studies, pulse oximetry and re-evaluation of patient's condition.    Medications Ordered in ED Medications  lactated ringers infusion ( Intravenous New Bag/Given 08/12/21 1653)  cefTRIAXone (ROCEPHIN) 2 g in sodium chloride 0.9 % 100 mL IVPB (has no administration in time range)  docusate sodium (COLACE) capsule 100 mg (has no administration in time range)  tamsulosin (FLOMAX) capsule 0.4 mg (has no administration in time range)  umeclidinium bromide (INCRUSE ELLIPTA) 62.5 MCG/ACT 1 puff (has no administration in time range)  Latanoprostene Bunod 0.024 % SOLN 1 drop (has no administration in time range)  Netarsudil Dimesylate 0.02 % SOLN 1 drop (has no administration in time range)  dorzolamide-timolol (COSOPT) 22.3-6.8 MG/ML ophthalmic solution 1 drop (has no administration in time range)  heparin injection 5,000 Units (has no administration in time range)  0.9 %  sodium chloride infusion (has no administration in time range)  acetaminophen (TYLENOL) tablet 650 mg (has no administration in time range)    Or  acetaminophen (TYLENOL) suppository 650 mg (has no administration in time range)  vancomycin variable dose per unstable renal function (pharmacist dosing) (has no administration in time range)  cefTRIAXone (ROCEPHIN) 1 g in sodium chloride 0.9 % 100 mL IVPB (1 g Intravenous Not Given 08/12/21 1847)  feeding supplement (ENSURE ENLIVE / ENSURE PLUS) liquid 237 mL (has no administration in time range)  albuterol (PROVENTIL) (2.5 MG/3ML) 0.083% nebulizer solution 2.5 mg (has no administration in time range)  lactated ringers bolus 1,000 mL (0 mLs Intravenous Stopped 08/12/21 1925)  cefTRIAXone (ROCEPHIN) 1 g in sodium chloride 0.9 % 100 mL IVPB (0 g Intravenous Stopped 08/12/21 1925)  lactated ringers bolus 1,000 mL (0  mLs Intravenous Stopped 08/12/21 1925)  vancomycin (VANCOCIN) IVPB 1000 mg/200 mL premix (0 mg Intravenous Stopped 08/12/21 1925)    ED Course/ Medical Decision Making/ A&P                           Medical Decision Making Amount and/or Complexity of Data Reviewed Independent Historian: EMS External Data Reviewed: labs, radiology and notes. Labs: ordered. Radiology: ordered and independent interpretation performed. ECG/medicine tests: ordered and independent interpretation  performed.  Risk Prescription drug management. Decision regarding hospitalization.    Ian Boyd is a 71 y.o. male with a past medical history significant for hepatitis C, COPD, and recent admission for psoas abscess who just had urological procedures performed yesterday currently on antibiotics for UTI who presents for problem with his left nephrostomy tube and possible sepsis.  According to patient and EMS, patient's left nephrostomy tube stitch ripped out and it retracted approximately 6 cm.  Patient thought it was either getting ready to or had already fallen out.  He says that he has had some chills, sweating, cough, diarrhea, and fatigue but is denying any significant flank pain, abdominal pain, chest pain, or low back pain.  He denies nausea, vomiting, constipation, or trauma.  He does say the urine in his Foley catheter is dark and cloudy but he just had the procedure yesterday.  EMS found to be tachycardic in the 130s on arrival and so they started him on some fluids and it started to improve into the 120s.  He was warm to the touch.   Patient otherwise is just feeling fatigued with his mild cough and diarrhea but he is denying any flank pains.  On exam, lungs had some faint coarseness.  Chest and abdomen were nontender.  Left flank was nontender but he does have a nephrostomy tube that is approximately 6 cm retracted from where it is secured.  Patient also has a Foley catheter in place which we examined with  a chaperone in the room.  There is no groin tenderness or evidence of rash.  Patient otherwise resting.  Patient was tachycardic, tachypneic, and had a temperature 100.2 currently on antibiotics for UTI with worsening symptoms we will activate a code sepsis.   4:01 PM Just spoke to Dr. Gloriann Loan with urology who reviewed the case.  He recommends going ahead and giving broad-spectrum antibiotics and admission to the hospitalist service.  He does request that we get a CT without contrast to assess where the nephrostomy tube is located as well as look for other complications.  We will get the work-up started and then call for admission for sepsis  5:29 PM Patient CT scan shows the nephrostomy tube still coiled in the renal pelvis.  Also it shows possible right-sided acute uncomplicated diverticulitis.  Patient is denying any flank pain abdominal pain, or back pain. Urine does appear infected so we will continue the Rocephin as was recommended by I discussed with pharmacy.  Lactic acid was elevated at 4.3, will trend.  COVID and flu negative.  White count elevated 26 still agree with the code sepsis plan.  X-ray did not show pneumonia.  We will call medicine for admission.          Final Clinical Impression(s) / ED Diagnoses Final diagnoses:  Sepsis, due to unspecified organism, unspecified whether acute organ dysfunction present [facility])  Urinary tract infection without hematuria, site unspecified    Clinical Impression: 1. Sepsis, due to unspecified organism, unspecified whether acute organ dysfunction present ([place])   2. Urinary tract infection without hematuria, site unspecified     Disposition: Admit  This note was prepared with assistance of Dragon voice recognition software. Occasional wrong-word or sound-a-like substitutions may have occurred due to the inherent limitations of voice recognition software.     Kiandra Sanguinetti, Gwenyth Allegra, MD 08/12/21 2129

## 2021-08-12 NOTE — ED Notes (Signed)
Patient was repositioned in bed. He is alert and oriented x4, with no complaints at this time.

## 2021-08-12 NOTE — ED Triage Notes (Signed)
Pt BIB EMS from [place] pt cystoscopy yesterday, stitch to drain on left flank came out today per staff

## 2021-08-12 NOTE — Consult Note (Signed)
H&P Physician requesting consult: Caren Griffins, MD  Chief Complaint: Recent urological procedure, concern for sepsis  History of Present Illness: 71 year old male underwent the following procedures yesterday:  1.  Cystoscopy 2. Right ureteroscopy with laser lithotripsy and basket extraction of stones 3. Bilateral retrograde pyelogram 4. Left antegrade pyelogram 5. Removal of bladder stones 5. Right ureteral stent placement  Currently, the patient has a left-sided nephrostomy tube that is capped.  Has a right-sided ureteral stent.  He presented to the emergency department after his facility thought that his left-sided nephrostomy tube got pulled out.  Patient did not really feel poorly and actually had very few complaints.  However, he had a low-grade fever of 100.2, tachypnea, tachycardia.  He was found to have leukocytosis of 26.7 and a lactic acid of 4.3. CT scan was performed that revealed left-sided percutaneous nephrostomy tube in appropriate position within the renal pelvis.  There was no hydronephrosis on the left.  He also had a right ureteral stent that was in good position without hydronephrosis.  He had some inflammatory stranding around the cecum and ascending colon felt to possibly represent acute uncomplicated diverticulitis.  Patient currently denies any abdominal pain or discomfort.  He really has no complaints at all.  Patient was given ceftriaxone in the emergency department.  He was evaluated by our hospitalist, Dr. Cruzita Lederer, and there are plans to also start vancomycin.  There are also plans for fluid resuscitation.  Past Medical History:  Diagnosis Date   Acid reflux    Blind right eye    BPH (benign prostatic hyperplasia)    COPD (chronic obstructive pulmonary disease) (HCC)    Drug abuse (HCC)    Glaucoma    Hepatitis C    History of severe sepsis    Hyponatremia    Psoas abscess, right ([place]) 06/2021   Rotator cuff tear    Chronic right   Sepsis ([place])  06/2021   E coli bacteremia from a urinary source complicated by obstructive ureteral calculus   Thrombocytopenia ([place])    Past Surgical History:  Procedure Laterality Date   CATARACT EXTRACTION Left    CYSTOSCOPY/URETEROSCOPY/HOLMIUM LASER/STENT PLACEMENT N/A 08/11/2021   Procedure: CYSTOSCOPY/BILATERAL URETEROSCOPY/HOLMIUM LASER/RETROGRADE PYELOGRAMS/RIGHT STENT PLACEMENT;  Surgeon: Janith Lima, MD;  Location: [facility];  Service: Urology;  Laterality: N/A;   ESOPHAGEAL DILATION N/A 05/12/2018   Procedure: ESOPHAGEAL DILATION;  Surgeon: Rogene Houston, MD;  Location: [facility];  Service: Endoscopy;  Laterality: N/A;   ESOPHAGOGASTRODUODENOSCOPY N/A 05/12/2018   Procedure: ESOPHAGOGASTRODUODENOSCOPY (EGD);  Surgeon: Rogene Houston, MD;  Location: [facility];  Service: Endoscopy;  Laterality: N/A;  2:45   IR NEPHROSTOMY PLACEMENT LEFT  06/26/2021   IR RADIOLOGIST EVAL & MGMT  07/16/2021    Home Medications:  (Not in a hospital admission)  Allergies: No Known Allergies  History reviewed. No pertinent family history. Social History:  reports that he has been smoking cigarettes. He has been smoking an average of .5 packs per day. He has never used smokeless tobacco. He reports that he does not currently use drugs after having used the following drugs: Cocaine and Marijuana. He reports that he does not drink alcohol.  ROS: A complete review of systems was performed.  All systems are negative except for pertinent findings as noted. ROS   Physical Exam:  Vital signs in last 24 hours: Temp:  [100.2 F (37.9 C)] 100.2 F (37.9 C) (05/23 1540) Pulse Rate:  [122-124] 122 (05/23 1730) Resp:  [  19-26] 19 (05/23 1730) BP: (101-121)/(57-68) 108/63 (05/23 1730) SpO2:  [95 %-100 %] 95 % (05/23 1730) Weight:  [57.6 kg] 57.6 kg (05/23 1541) General:  Alert and oriented, No acute distress lying in bed HEENT: Normocephalic, atraumatic Neck: No JVD or lymphadenopathy Cardiovascular:  Tachycardic, normal rhythm Lungs: Regular rate and effort Abdomen: Soft, nontender, nondistended, no abdominal masses Back: No CVA tenderness left-sided nephrostomy tube is capped.  Stitch is no longer in place. Foley catheter draining clear yellow urine   Laboratory Data:  Results for orders placed or performed during the hospital encounter of 08/12/21 (from the past 24 hour(s))  CBC with Differential     Status: Abnormal   Collection Time: 08/12/21  3:52 PM  Result Value Ref Range   WBC 26.7 (H) 4.0 - 10.5 K/uL   RBC 4.05 (L) 4.22 - 5.81 MIL/uL   Hemoglobin 11.0 (L) 13.0 - 17.0 g/dL   HCT 34.5 (L) 39.0 - 52.0 %   MCV 85.2 80.0 - 100.0 fL   MCH 27.2 26.0 - 34.0 pg   MCHC 31.9 30.0 - 36.0 g/dL   RDW 15.9 (H) 11.5 - 15.5 %   Platelets 320 150 - 400 K/uL   nRBC 0.0 0.0 - 0.2 %   Neutrophils Relative % 88 %   Neutro Abs 23.5 (H) 1.7 - 7.7 K/uL   Lymphocytes Relative 4 %   Lymphs Abs 1.0 0.7 - 4.0 K/uL   Monocytes Relative 7 %   Monocytes Absolute 2.0 (H) 0.1 - 1.0 K/uL   Eosinophils Relative 0 %   Eosinophils Absolute 0.0 0.0 - 0.5 K/uL   Basophils Relative 0 %   Basophils Absolute 0.0 0.0 - 0.1 K/uL   WBC Morphology VACUOLATED NEUTROPHILS    Immature Granulocytes 1 %   Abs Immature Granulocytes 0.24 (H) 0.00 - 0.07 K/uL  Comprehensive metabolic panel     Status: Abnormal   Collection Time: 08/12/21  3:52 PM  Result Value Ref Range   Sodium 137 135 - 145 mmol/L   Potassium 4.1 3.5 - 5.1 mmol/L   Chloride 105 98 - 111 mmol/L   CO2 22 22 - 32 mmol/L   Glucose, Bld 119 (H) 70 - 99 mg/dL   BUN 20 8 - 23 mg/dL   Creatinine, Ser 1.79 (H) 0.61 - 1.24 mg/dL   Calcium 8.4 (L) 8.9 - 10.3 mg/dL   Total Protein 6.8 6.5 - 8.1 g/dL   Albumin 2.8 (L) 3.5 - 5.0 g/dL   AST 54 (H) 15 - 41 U/L   ALT 39 0 - 44 U/L   Alkaline Phosphatase 83 38 - 126 U/L   Total Bilirubin 0.7 0.3 - 1.2 mg/dL   GFR, Estimated 40 (L) >60 mL/min   Anion gap 10 5 - 15  Lactic acid, plasma     Status:  Abnormal   Collection Time: 08/12/21  3:52 PM  Result Value Ref Range   Lactic Acid, Venous 4.3 (HH) 0.5 - 1.9 mmol/L  Lipase, blood     Status: None   Collection Time: 08/12/21  3:52 PM  Result Value Ref Range   Lipase 23 11 - 51 U/L  Protime-INR     Status: None   Collection Time: 08/12/21  4:01 PM  Result Value Ref Range   Prothrombin Time 15.0 11.4 - 15.2 seconds   INR 1.2 0.8 - 1.2  APTT     Status: None   Collection Time: 08/12/21  4:01 PM  Result Value Ref Range  aPTT 36 24 - 36 seconds  Urinalysis, Routine w reflex microscopic Nasopharyngeal Swab     Status: Abnormal   Collection Time: 08/12/21  4:25 PM  Result Value Ref Range   Color, Urine AMBER (A) YELLOW   APPearance TURBID (A) CLEAR   Specific Gravity, Urine 1.017 1.005 - 1.030   pH 5.0 5.0 - 8.0   Glucose, UA NEGATIVE NEGATIVE mg/dL   Hgb urine dipstick LARGE (A) NEGATIVE   Bilirubin Urine NEGATIVE NEGATIVE   Ketones, ur NEGATIVE NEGATIVE mg/dL   Protein, ur >=300 (A) NEGATIVE mg/dL   Nitrite NEGATIVE NEGATIVE   Leukocytes,Ua MODERATE (A) NEGATIVE   RBC / HPF >50 (H) 0 - 5 RBC/hpf   WBC, UA >50 (H) 0 - 5 WBC/hpf   Bacteria, UA NONE SEEN NONE SEEN   WBC Clumps PRESENT    Mucus PRESENT   Resp Panel by RT-PCR (Flu A&B, Covid) Nasopharyngeal Swab     Status: None   Collection Time: 08/12/21  4:25 PM   Specimen: Nasopharyngeal Swab; Nasopharyngeal(NP) swabs in vial transport medium  Result Value Ref Range   SARS Coronavirus 2 by RT PCR NEGATIVE NEGATIVE   Influenza A by PCR NEGATIVE NEGATIVE   Influenza B by PCR NEGATIVE NEGATIVE   Recent Results (from the past 240 hour(s))  Resp Panel by RT-PCR (Flu A&B, Covid) Nasopharyngeal Swab     Status: None   Collection Time: 08/12/21  4:25 PM   Specimen: Nasopharyngeal Swab; Nasopharyngeal(NP) swabs in vial transport medium  Result Value Ref Range Status   SARS Coronavirus 2 by RT PCR NEGATIVE NEGATIVE Final    Comment: (NOTE) SARS-CoV-2 target nucleic acids are  NOT DETECTED.  The SARS-CoV-2 RNA is generally detectable in upper respiratory specimens during the acute phase of infection. The lowest concentration of SARS-CoV-2 viral copies this assay can detect is 138 copies/mL. A negative result does not preclude SARS-Cov-2 infection and should not be used as the sole basis for treatment or other patient management decisions. A negative result may occur with  improper specimen collection/handling, submission of specimen other than nasopharyngeal swab, presence of viral mutation(s) within the areas targeted by this assay, and inadequate number of viral copies(<138 copies/mL). A negative result must be combined with clinical observations, patient history, and epidemiological information. The expected result is Negative.  Fact Sheet for Patients:  EntrepreneurPulse.com.au  Fact Sheet for Healthcare Providers:  IncredibleEmployment.be  This test is no t yet approved or cleared by the [place] FDA and  has been authorized for detection and/or diagnosis of SARS-CoV-2 by FDA under an Emergency Use Authorization (EUA). This EUA will remain  in effect (meaning this test can be used) for the duration of the COVID-19 declaration under Section 564(b)(1) of the Act, 21 U.S.C.section 360bbb-3(b)(1), unless the authorization is terminated  or revoked sooner.       Influenza A by PCR NEGATIVE NEGATIVE Final   Influenza B by PCR NEGATIVE NEGATIVE Final    Comment: (NOTE) The Xpert Xpress SARS-CoV-2/FLU/RSV plus assay is intended as an aid in the diagnosis of influenza from Nasopharyngeal swab specimens and should not be used as a sole basis for treatment. Nasal washings and aspirates are unacceptable for Xpert Xpress SARS-CoV-2/FLU/RSV testing.  Fact Sheet for Patients: EntrepreneurPulse.com.au  Fact Sheet for Healthcare Providers: IncredibleEmployment.be  This test is not  yet approved or cleared by the [place] FDA and has been authorized for detection and/or diagnosis of SARS-CoV-2 by FDA under an Emergency Use Authorization (EUA).  This EUA will remain in effect (meaning this test can be used) for the duration of the COVID-19 declaration under Section 564(b)(1) of the Act, 21 U.S.C. section 360bbb-3(b)(1), unless the authorization is terminated or revoked.  Performed at [facility], [place] [address] [place], [place] [address]    Creatinine: Recent Labs    08/11/21 1158 08/12/21 1552  CREATININE 1.15 1.79*    Impression/Assessment:  Sepsis possibly secondary to urinary tract infection Renal/ureteral calculi, improved  Plan:  Patient has no hydronephrosis on the left with the nephrostomy tube capped.  I will keep it capped but keep it in place for now.  Right-sided ureteral stent is in good position without hydronephrosis.  Bladder is decompressed with the Foley.  Keep Foley catheter in place.  No surgical intervention recommended.  Agree with antibiotics and fluid resuscitation as you are doing.  I will notify Dr. Abner Greenspan.  Marton Redwood, III 08/12/2021, 6:07 PM

## 2021-08-12 NOTE — Sepsis Progress Note (Signed)
Notified provider of need to order fluid bolus for lactic acid of .4.3

## 2021-08-12 NOTE — Progress Notes (Signed)
Pharmacy Antibiotic Note  Ian Boyd is a 71 y.o. male admitted on 08/12/2021 with sepsis.  Pharmacy has been consulted for Vancomycin dosing.  Patient with multiple recent UTIs (no hx resistance per micro hx) and right renal stone s/p stent placement yesterday.  Scr increased on admission (baseline <1.0)  Plan: Vancomycin 1gm IV x1 now Monitor renal function and cx data  Check random Vancomycin level on 5/25 with morning labs  Height: 6' (182.9 cm) Weight: 57.6 kg (127 lb) IBW/kg (Calculated) : 77.6  Temp (24hrs), Avg:100.2 F (37.9 C), Min:100.2 F (37.9 C), Max:100.2 F (37.9 C)  Recent Labs  Lab 08/11/21 1158 08/12/21 1552  WBC 9.0 26.7*  CREATININE 1.15 1.79*  LATICACIDVEN  --  4.3*    Estimated Creatinine Clearance: 29.9 mL/min (A) (by C-G formula based on SCr of 1.79 mg/dL (H)).    No Known Allergies  Antimicrobials this admission: 5/23 Rocephin >>  5/23 Vancomycin >>   Dose adjustments this admission:  Microbiology results: 5/23 BCx:  5/23 UCx:    Thank you for allowing pharmacy to be a part of this patient's care.  Netta Cedars PharmD 08/12/2021 5:47 PM

## 2021-08-12 NOTE — Progress Notes (Addendum)
Rapid Response Note    Reason for Call :  Rapid Response Rounding   Initial Focused Assessment:  Yellow MEWS   Interventions: Patient is tachycardic and Lactic acid order placed for recheck   Plan of Care: Discussed with Primary RN Sabino Snipes at ext [phone].  Patient is Septic and tachycardic.  NS bolus running.  LR IVF d/c'd per on-call MD.  Primary RN will continue to monitor. Offered assistance if needed.   MD Notified: Clarene Essex NP Call Time: In-person  [phone]   Ethelene Hal, RN

## 2021-08-12 NOTE — Sepsis Progress Note (Signed)
Sepsis protocol monitored by eLink 

## 2021-08-12 NOTE — ED Notes (Signed)
CRITICAL VALUE STICKER  CRITICAL VALUE: Lactic Acid 4.3  DATE & TIME NOTIFIED: 08/12/2021 1625  MD NOTIFIED: T[place] MD  TIME OF NOTIFICATION: [phone]

## 2021-08-13 DIAGNOSIS — R6520 Severe sepsis without septic shock: Secondary | ICD-10-CM

## 2021-08-13 DIAGNOSIS — A4152 Sepsis due to Pseudomonas: Secondary | ICD-10-CM

## 2021-08-13 DIAGNOSIS — N179 Acute kidney failure, unspecified: Secondary | ICD-10-CM

## 2021-08-13 LAB — BLOOD CULTURE ID PANEL (REFLEXED) - BCID2

## 2021-08-13 LAB — COMPREHENSIVE METABOLIC PANEL
ALT: 38 U/L (ref 0–44)
AST: 51 U/L — ABNORMAL HIGH (ref 15–41)
Albumin: 2.5 g/dL — ABNORMAL LOW (ref 3.5–5.0)
Alkaline Phosphatase: 70 U/L (ref 38–126)
Anion gap: 8 (ref 5–15)
BUN: 21 mg/dL (ref 8–23)
CO2: 22 mmol/L (ref 22–32)
Calcium: 8.2 mg/dL — ABNORMAL LOW (ref 8.9–10.3)
Chloride: 108 mmol/L (ref 98–111)
Creatinine, Ser: 1.75 mg/dL — ABNORMAL HIGH (ref 0.61–1.24)
GFR, Estimated: 41 mL/min — ABNORMAL LOW (ref >60)
Glucose, Bld: 99 mg/dL (ref 70–99)
Potassium: 4.1 mmol/L (ref 3.5–5.1)
Sodium: 138 mmol/L (ref 135–145)
Total Bilirubin: 1 mg/dL (ref 0.3–1.2)
Total Protein: 6.4 g/dL — ABNORMAL LOW (ref 6.5–8.1)

## 2021-08-13 LAB — LACTIC ACID, PLASMA
Lactic Acid, Venous: 2.6 mmol/L (ref 0.5–1.9)
Lactic Acid, Venous: 2 mmol/L (ref 0.5–1.9)
Lactic Acid, Venous: 2.3 mmol/L (ref 0.5–1.9)

## 2021-08-13 LAB — CBC
HCT: 31.8 % — ABNORMAL LOW (ref 39.0–52.0)
Hemoglobin: 10.2 g/dL — ABNORMAL LOW (ref 13.0–17.0)
MCH: 26.5 pg (ref 26.0–34.0)
MCHC: 32.1 g/dL (ref 30.0–36.0)
MCV: 82.6 fL (ref 80.0–100.0)
Platelets: 302 10*3/uL (ref 150–400)
RBC: 3.85 MIL/uL — ABNORMAL LOW (ref 4.22–5.81)
RDW: 16.1 % — ABNORMAL HIGH (ref 11.5–15.5)
WBC: 33.2 10*3/uL — ABNORMAL HIGH (ref 4.0–10.5)
nRBC: 0 % (ref 0.0–0.2)

## 2021-08-13 LAB — MAGNESIUM: Magnesium: 1.3 mg/dL — ABNORMAL LOW (ref 1.7–2.4)

## 2021-08-13 LAB — MRSA NEXT GEN BY PCR, NASAL: MRSA by PCR Next Gen: NOT DETECTED

## 2021-08-13 MED ORDER — SODIUM CHLORIDE 0.9 % IV SOLN
2.0000 g | Freq: Two times a day (BID) | INTRAVENOUS | Status: DC
  Administered 2021-08-13 – 2021-08-14 (×4): 2 g via INTRAVENOUS
  Filled 2021-08-13 (×6): qty 12.5

## 2021-08-13 MED ORDER — MAGNESIUM SULFATE 2 GM/50ML IV SOLN
2.0000 g | Freq: Once | INTRAVENOUS | Status: AC
  Administered 2021-08-13: 2 g via INTRAVENOUS
  Filled 2021-08-13: qty 50

## 2021-08-13 MED ORDER — LIP MEDEX EX OINT
TOPICAL_OINTMENT | CUTANEOUS | Status: DC | PRN
  Filled 2021-08-13: qty 7

## 2021-08-13 MED ORDER — GUAIFENESIN-DM 100-10 MG/5ML PO SYRP
5.0000 mL | ORAL_SOLUTION | ORAL | Status: DC | PRN
  Administered 2021-08-13 – 2021-08-14 (×2): 5 mL via ORAL
  Filled 2021-08-13 (×2): qty 10

## 2021-08-13 MED ORDER — CHLORHEXIDINE GLUCONATE CLOTH 2 % EX PADS
6.0000 | MEDICATED_PAD | Freq: Every day | CUTANEOUS | Status: DC
  Administered 2021-08-14 – 2021-08-20 (×7): 6 via TOPICAL

## 2021-08-13 NOTE — Progress Notes (Signed)
Called to check on patient with increased Yellow MEWS.  Talked to [facility] about T 101.2 trending up and Lactic 2.6 trending down.  Suggested Tylenol, ice packs and lactic acid reordered.   Primary RN will still monitor.

## 2021-08-13 NOTE — Progress Notes (Signed)
Pt called sister, Bascom Levels, and pt requested for RN to update her. RN updated Vaughan Basta and reassured her staff would call if there were any changes.

## 2021-08-13 NOTE — Progress Notes (Signed)
Pharmacy Antibiotic Note  Ian Boyd is a 71 y.o. male admitted on 08/12/2021 with sepsis.  Pharmacy has been consulted for Vancomycin and cefepime dosing.  Patient with multiple recent UTIs (no hx resistance per micro hx) and right renal stone s/p stent placement yesterday.  Scr increased on admission (baseline <1.0)  Today. UCx is growing Pseudomonas. Plan to continue vancomycin due to recent hardware.   Plan: Vancomycin 1gm IV x1 now Monitor renal function and cx data  Check random Vancomycin level on 5/25 with morning labs Cefepime 2 gr IV q12h   Height: 6' (182.9 cm) Weight: 63.6 kg (140 lb 3.4 oz) IBW/kg (Calculated) : 77.6  Temp (24hrs), Avg:99.4 F (37.4 C), Min:98 F (36.7 C), Max:101.2 F (38.4 C)  Recent Labs  Lab 08/11/21 1158 08/12/21 1552 08/12/21 1552 08/12/21 1838 08/12/21 2232 08/13/21 0105 08/13/21 0436 08/13/21 0658  WBC 9.0 26.7*  --   --   --  33.2*  --   --   CREATININE 1.15 1.79*  --   --   --  1.75*  --   --   LATICACIDVEN  --  4.3*   < > 3.4* 4.0* 2.6* 2.0* 2.3*   < > = values in this interval not displayed.     Estimated Creatinine Clearance: 33.8 mL/min (A) (by C-G formula based on SCr of 1.75 mg/dL (H)).    No Known Allergies  Antimicrobials this admission: 5/23 Rocephin >> 5/24 5/23 Vancomycin >>  5/24 cefepime  Dose adjustments this admission:  Microbiology results: 5/23 BCx: NGTD  5/23 UCx:  >100k P. Aeruginosa  5/24 MRSA PCR: negative   Thank you for allowing pharmacy to be a part of this patient's care.   Royetta Asal, PharmD, BCPS 08/13/2021 11:18 AM

## 2021-08-13 NOTE — Progress Notes (Addendum)
Subjective: Denies abdominal pain or flank pain. Tmax 101.2, downtended to 98.9. Tolerating foley catheter. L PCN remains clamped with no left flank pain.  Objective: Vital signs in last 24 hours: Temp:  [98 F (36.7 C)-101.2 F (38.4 C)] 98.9 F (37.2 C) (05/24 0627) Pulse Rate:  [109-125] 109 (05/24 0627) Resp:  [18-26] 20 (05/24 0746) BP: (101-127)/(57-68) 113/68 (05/24 0627) SpO2:  [95 %-100 %] 97 % (05/24 0746) Weight:  [57.6 kg-63.6 kg] 63.6 kg (05/23 2121)  Intake/Output from previous day: 05/23 0701 - 05/24 0700 In: 738.7 [P.O.:180; I.V.:558.7] Out: 1650 [Urine:1650] Intake/Output this shift: No intake/output data recorded. UOP: 1.6L slight pink tinge  Physical Exam:  General: Alert and oriented CV: RRR Lungs: Clear Abdomen: Soft, ND, NT, No CVA tenderness Ext: NT, No erythema  Lab Results: Recent Labs    08/11/21 1158 08/12/21 1552 08/13/21 0105  HGB 12.3* 11.0* 10.2*  HCT 41.2 34.5* 31.8*   BMET Recent Labs    08/12/21 1552 08/13/21 0105  NA 137 138  K 4.1 4.1  CL 105 108  CO2 22 22  GLUCOSE 119* 99  BUN 20 21  CREATININE 1.79* 1.75*  CALCIUM 8.4* 8.2*     Studies/Results: CT ABDOMEN PELVIS WO CONTRAST  Result Date: 08/12/2021 CLINICAL DATA:  Nephrostomy tube dislodgement, recent procedure, concern for complication and/or sepsis. EXAM: CT ABDOMEN AND PELVIS WITHOUT CONTRAST TECHNIQUE: Multidetector CT imaging of the abdomen and pelvis was performed following the standard protocol without IV contrast. RADIATION DOSE REDUCTION: This exam was performed according to the departmental dose-optimization program which includes automated exposure control, adjustment of the mA and/or kV according to patient size and/or use of iterative reconstruction technique. COMPARISON:  CT July 16, 2021. FINDINGS: Lower chest: Hypoventilatory change in the lung bases. Hepatobiliary: Hepatic cysts. Gallbladder is unremarkable. No biliary ductal dilation. Pancreas: No  pancreatic ductal dilation or evidence of acute inflammation. Spleen: No splenomegaly or focal splenic lesion. Adrenals/Urinary Tract: Bilateral adrenal glands are unremarkable. Left percutaneous nephrostomy tube with pigtail coiled in the renal pelvis. No hydronephrosis. Nonobstructive left renal calculi measure up to 2 mm. Left nephroureterostomy tube with pigtails coiled in the renal pelvis and urinary bladder without hydronephrosis. Nonobstructive right renal stones measure up to 4 mm. Gas in the urinary bladder which is decompressed around a Foley catheter. Stomach/Bowel: No radiopaque enteric contrast material was administered. Stomach is nondistended limiting evaluation. No pathologic dilation of small or large bowel. Right-sided colonic diverticulosis with inflammatory stranding along the cecum/ascending colon. Noninflamed appendix is visualized. Vascular/Lymphatic: Aortic and branch vessel atherosclerosis without abdominal aortic aneurysm. No pathologically enlarged abdominal or pelvic lymph nodes within the limitation of no intravenous contrast material and relative paucity of peritoneal fat. Reproductive: Enlarged prostate gland. Other: No walled off fluid collections. No pneumoperitoneum. Small bilateral inguinal hernias both containing fat and a short segment of nonobstructed bowel. Musculoskeletal: Multilevel degenerative changes spine most severe at L4-L5 marked discogenic disease and Modic type endplate changes. IMPRESSION: 1. There is inflammatory stranding along the cecum and ascending colon the etiology of which is difficult to determine given the relative paucity of abdominal fat and lack of intravenous contrast material. There is no significant right-sided perinephric stranding and the appendix appears normal. However, there are right-sided colonic diverticula, as such these findings are most compatible with acute uncomplicated diverticulitis. 2. Left percutaneous nephrostomy tube with pigtails  coiled in the renal pelvis and urinary bladder without hydronephrosis. 3. Left nephroureterostomy tube with pigtails coiled in the renal pelvis and urinary  bladder without hydronephrosis. 4. Bilateral nonobstructive renal stones. 5. Small bilateral inguinal hernias both containing fat and a short segment of nonobstructed bowel. 6.  Aortic Atherosclerosis (ICD10-I70.0). Electronically Signed   By: Dahlia Bailiff M.D.   On: 08/12/2021 16:31   DG Chest Portable 1 View  Result Date: 08/12/2021 CLINICAL DATA:  Cough EXAM: PORTABLE CHEST 1 VIEW COMPARISON:  06/25/2021 FINDINGS: Cardiac size is within normal limits. Thoracic aorta is tortuous. There are no signs of pulmonary edema or focal pulmonary consolidation. There is no pleural effusion or pneumothorax. IMPRESSION: No active disease. Electronically Signed   By: Elmer Picker M.D.   On: 08/12/2021 16:13   DG C-Arm 1-60 Min-No Report  Result Date: 08/11/2021 Fluoroscopy was utilized by the requesting physician.  No radiographic interpretation.   DG C-Arm 1-60 Min-No Report  Result Date: 08/11/2021 Fluoroscopy was utilized by the requesting physician.  No radiographic interpretation.   DG C-Arm 1-60 Min-No Report  Result Date: 08/11/2021 Fluoroscopy was utilized by the requesting physician.  No radiographic interpretation.    Assessment/Plan: Sepsis due to UTI after right ureteroscopy with laser lithotripsy for right sided stones  -He was hospitalized in 4/20223 with UTI and required L PCN for left ureteral stone. He remained on cefadroxil at his rehab facility planned through 5/26 to cover him peri-operatively.  -Subsequent CT 07/16/2021 with interval passage of left ureteral stone. Unable to pass a wire through distal left ureter given significant J-hooking of left ureter. Antegrade contrast drained appropriately so left PCN was capped on 08/12/21 AM. -Presented with intermittent fevers, tachycardia. Currently 24F and lower tachycardia in  100s. -CT reviewed 08/12/21 without left hydronephrosis -Cr remains slightly elevated -Continue broad spectrum antibiotics. If persistent fevers and worsening creatinine, I may need to unclamp the nephrostomy tube.  -Keep right ureteral stent in place which is attached to his foley catheter for maximal drainage. -F/u urine cx and week abx as able -Following   LOS: 1 day   Matt R. Segundo Makela MD 08/13/2021, [facility]  Pager: [phone]

## 2021-08-13 NOTE — Progress Notes (Signed)
PROGRESS NOTE  Ian Boyd JHE:[id number] DOB: Jul 23, 1950 DOA: 08/12/2021 PCP: Curlene Labrum, MD   LOS: 1 day   Brief Narrative / Interim history: 71 y.o. male with medical history significant of hypertension, recent psoas abscess, COPD, nephrolithiasis, comes into the hospital from the SNF due to fever and tachycardia.  He was recently hospitalized in April 2023 with E. 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  He had a drain placed at that time, and ID followed patient while hospitalized.  Eventually improved, follow-up with ID as an outpatient about 3 weeks ago, and was supposed to stay on cefadroxil until 5/25.  He underwent treatment for his nephrolithiasis 5/22 with cystoscopy, right ureteroscopy, stone extraction and right ureteral stent placement.  24 hours afterwards developed a fever, tachycardia, and was brought to the hospital  Subjective / 24h Interval events: Doing well this morning.  Has no complaints.  No abdominal pain, no nausea or vomiting.  No further diarrhea.  Assesement and Plan: Principal Problem:   Sepsis ([place]) Active Problems:   Psoas abscess, right (HCC)   Hydronephrosis of left kidney   AKI (acute kidney injury) ([place])   Urinary tract infection   Nephrolithiasis   Hepatitis C  Principal problem Sepsis due to presumed urinary source-patient presents 24 hours after instrumentation with elevated WBC, fever, tachycardia and pyuria.  Blood cultures and urine cultures were sent.  Has been placed on vancomycin and ceftriaxone, preliminary urine cultures showing Pseudomonas.  Change ceftriaxone to cefepime. CT scan of the abdomen and pelvis raised suspicion for diverticulitis however he has no abdominal pain and his exam is unremarkable.  Urology consulted   Active problems Nephrolithiasis/bladder stone, left kidney hydronephrosis-patient just underwent cystoscopy with the prior to admission with  laser lithotripsy on the right and extraction of stones, he also had a right ureteral stent placement.  Also underwent a cystoscopy which showed bladder diverticuli.  He also has a left percutaneous nephrostomy tube, initial concern was that it got dislodged earlier today but the CT scan shows it in the right place.  Continue antibiotics, appreciate urology follow-up, monitor cultures as above   Essential hypertension-hold blood pressure medication due to being normotensive, concern for sepsis   Acute kidney injury-due to sepsis, creatinine remained stable today.  Continue fluids   Urinary retention-continue home medications   COPD-stable, no wheezing, continue home inhalers   Recent psoas abscess, recent E. coli bacteremia-was already on antibiotics as an outpatient for 2 additional days with plan to end on 5/25.  ID follow-up as an outpatient.   Right rotator cuff tear-chronic, has been going on for the past 3 years  History of hep C-ID will follow as an outpatient  Scheduled Meds:  dorzolamide-timolol  1 drop Left Eye BID   feeding supplement  237 mL Oral BID BM   heparin  5,000 Units Subcutaneous Q8H   latanoprost  1 drop Left Eye QHS   Netarsudil Dimesylate  1 drop Left Eye QHS   tamsulosin  0.4 mg Oral QHS   umeclidinium bromide  1 puff Inhalation Daily   vancomycin variable dose per unstable renal function (pharmacist dosing)   Does not apply See admin instructions   Continuous Infusions:  sodium chloride 100 mL/hr at 08/13/21 1856   cefTRIAXone (ROCEPHIN)  IV     cefTRIAXone (ROCEPHIN)  IV     PRN Meds:.acetaminophen **OR** acetaminophen, albuterol, docusate sodium  Diet Orders (From  admission, onward)     Start     Ordered   08/12/21 2115  Diet Heart Room service appropriate? Yes; Fluid consistency: Thin  Diet effective now       Question Answer Comment  Room service appropriate? Yes   Fluid consistency: Thin      08/12/21 2115            DVT prophylaxis:  heparin injection 5,000 Units Start: 08/12/21 2200   Lab Results  Component Value Date   PLT 302 08/13/2021      Code Status: Full Code  Family Communication: no family at bedside   Status is: Inpatient  Remains inpatient appropriate because: IV antibiotics, monitor cultures, leukocytosis  Level of care: Progressive  Consultants:  Urology   Procedures:  none  Microbiology  Urine cultures - pseudomonas  Objective: Vitals:   08/13/21 0459 08/13/21 0627 08/13/21 0746 08/13/21 0948  BP:  113/68  120/66  Pulse:  (!) 109  (!) 108  Resp:  '18 20 20  '$ Temp: 98 F (36.7 C) 98.9 F (37.2 C)  99 F (37.2 C)  TempSrc: Oral Oral  Oral  SpO2:  97% 97% 99%  Weight:      Height:        Intake/Output Summary (Last 24 hours) at 08/13/2021 1100 Last data filed at 08/13/2021 0900 Gross per 24 hour  Intake 978.65 ml  Output 2100 ml  Net -1121.35 ml   Wt Readings from Last 3 Encounters:  08/12/21 63.6 kg  08/11/21 57.6 kg  06/29/21 57.3 kg    Examination:  Constitutional: NAD Eyes: no scleral icterus ENMT: Mucous membranes are moist.  Neck: normal, supple Respiratory: clear to auscultation bilaterally, no wheezing, no crackles. Normal respiratory effort. Cardiovascular: Regular rate and rhythm, no murmurs / rubs / gallops.  Abdomen: non distended, no tenderness. Bowel sounds positive.  Musculoskeletal: no clubbing / cyanosis.  Skin: no rashes Neurologic: non focal    Data Reviewed: I have independently reviewed following labs and imaging studies  CBC Recent Labs  Lab 08/11/21 1158 08/12/21 1552 08/13/21 0105  WBC 9.0 26.7* 33.2*  HGB 12.3* 11.0* 10.2*  HCT 41.2 34.5* 31.8*  PLT 380 320 302  MCV 90.7 85.2 82.6  MCH 27.1 27.2 26.5  MCHC 29.9* 31.9 32.1  RDW 15.7* 15.9* 16.1*  LYMPHSABS  --  1.0  --   MONOABS  --  2.0*  --   EOSABS  --  0.0  --   BASOSABS  --  0.0  --     Recent Labs  Lab 08/11/21 1158 08/12/21 1552 08/12/21 1552 08/12/21 1601  08/12/21 1838 08/12/21 2232 08/13/21 0105 08/13/21 0436 08/13/21 0658  NA 139 137  --   --   --   --  138  --   --   K 4.1 4.1  --   --   --   --  4.1  --   --   CL 107 105  --   --   --   --  108  --   --   CO2 22 22  --   --   --   --  22  --   --   GLUCOSE 93 119*  --   --   --   --  99  --   --   BUN 12 20  --   --   --   --  21  --   --   CREATININE  1.15 1.79*  --   --   --   --  1.75*  --   --   CALCIUM 9.2 8.4*  --   --   --   --  8.2*  --   --   AST 50* 54*  --   --   --   --  51*  --   --   ALT 41 39  --   --   --   --  38  --   --   ALKPHOS 102 83  --   --   --   --  70  --   --   BILITOT 0.6 0.7  --   --   --   --  1.0  --   --   ALBUMIN 3.3* 2.8*  --   --   --   --  2.5*  --   --   MG  --   --   --   --   --   --   --  1.3*  --   LATICACIDVEN  --  4.3*   < >  --  3.4* 4.0* 2.6* 2.0* 2.3*  INR  --   --   --  1.2  --   --   --   --   --    < > = values in this interval not displayed.    ------------------------------------------------------------------------------------------------------------------ No results for input(s): CHOL, HDL, LDLCALC, TRIG, CHOLHDL, LDLDIRECT in the last 72 hours.  No results found for: HGBA1C ------------------------------------------------------------------------------------------------------------------ No results for input(s): TSH, T4TOTAL, T3FREE, THYROIDAB in the last 72 hours.  Invalid input(s): FREET3  Cardiac Enzymes No results for input(s): CKMB, TROPONINI, MYOGLOBIN in the last 168 hours.  Invalid input(s): CK ------------------------------------------------------------------------------------------------------------------ No results found for: BNP  CBG: No results for input(s): GLUCAP in the last 168 hours.  Recent Results (from the past 240 hour(s))  Blood culture (routine x 2)     Status: None (Preliminary result)   Collection Time: 08/12/21  3:52 PM   Specimen: BLOOD  Result Value Ref Range Status   Specimen  Description   Final    BLOOD LEFT ANTECUBITAL Performed at [facility] [address], [place], [place] [address]    Special Requests   Final    BOTTLES DRAWN AEROBIC AND ANAEROBIC Blood Culture adequate volume Performed at [facility] [address], [place], [place] [address]    Culture   Final    NO GROWTH < 24 HOURS Performed at [facility] [address] [place], [place] [address]    Report Status PENDING  Incomplete  Blood culture (routine x 2)     Status: None (Preliminary result)   Collection Time: 08/12/21  3:52 PM   Specimen: BLOOD  Result Value Ref Range Status   Specimen Description   Final    BLOOD LEFT ANTECUBITAL Performed at [facility] [address], [place], [place] [address]    Special Requests   Final    BOTTLES DRAWN AEROBIC AND ANAEROBIC Blood Culture adequate volume Performed at [facility] [address] [place], [place] [address]    Culture   Final    NO GROWTH < 24 HOURS Performed at [facility] [address], [place], [place] [address]    Report Status PENDING  Incomplete  Urine Culture     Status: Abnormal (Preliminary result)   Collection Time:  08/12/21  4:25 PM   Specimen: Urine, Clean Catch  Result Value Ref Range Status   Specimen Description   Final    URINE, CLEAN CATCH Performed at [facility], [place] [address], [place], [place] [address]    Special Requests   Final    NONE Performed at [facility], [place] [address] [place], [place] [address]    Culture (A)  Final    >=100,000 COLONIES/mL GRAM NEGATIVE RODS SUSCEPTIBILITIES TO FOLLOW Performed at [facility], [place] [address] [place], [place] [address]    Report Status PENDING  Incomplete  Resp Panel by RT-PCR (Flu A&B, Covid) Nasopharyngeal Swab     Status: None   Collection Time: 08/12/21  4:25 PM   Specimen: Nasopharyngeal Swab;  Nasopharyngeal(NP) swabs in vial transport medium  Result Value Ref Range Status   SARS Coronavirus 2 by RT PCR NEGATIVE NEGATIVE Final    Comment: (NOTE) SARS-CoV-2 target nucleic acids are NOT DETECTED.  The SARS-CoV-2 RNA is generally detectable in upper respiratory specimens during the acute phase of infection. The lowest concentration of SARS-CoV-2 viral copies this assay can detect is 138 copies/mL. A negative result does not preclude SARS-Cov-2 infection and should not be used as the sole basis for treatment or other patient management decisions. A negative result may occur with  improper specimen collection/handling, submission of specimen other than nasopharyngeal swab, presence of viral mutation(s) within the areas targeted by this assay, and inadequate number of viral copies(<138 copies/mL). A negative result must be combined with clinical observations, patient history, and epidemiological information. The expected result is Negative.  Fact Sheet for Patients:  EntrepreneurPulse.com.au  Fact Sheet for Healthcare Providers:  IncredibleEmployment.be  This test is no t yet approved or cleared by the [place] FDA and  has been authorized for detection and/or diagnosis of SARS-CoV-2 by FDA under an Emergency Use Authorization (EUA). This EUA will remain  in effect (meaning this test can be used) for the duration of the COVID-19 declaration under Section 564(b)(1) of the Act, 21 U.S.C.section 360bbb-3(b)(1), unless the authorization is terminated  or revoked sooner.       Influenza A by PCR NEGATIVE NEGATIVE Final   Influenza B by PCR NEGATIVE NEGATIVE Final    Comment: (NOTE) The Xpert Xpress SARS-CoV-2/FLU/RSV plus assay is intended as an aid in the diagnosis of influenza from Nasopharyngeal swab specimens and should not be used as a sole basis for treatment. Nasal washings and aspirates are unacceptable for Xpert Xpress  SARS-CoV-2/FLU/RSV testing.  Fact Sheet for Patients: EntrepreneurPulse.com.au  Fact Sheet for Healthcare Providers: IncredibleEmployment.be  This test is not yet approved or cleared by the [place] FDA and has been authorized for detection and/or diagnosis of SARS-CoV-2 by FDA under an Emergency Use Authorization (EUA). This EUA will remain in effect (meaning this test can be used) for the duration of the COVID-19 declaration under Section 564(b)(1) of the Act, 21 U.S.C. section 360bbb-3(b)(1), unless the authorization is terminated or revoked.  Performed at [facility], [place] [address] [place], [place] [address]   MRSA Next Gen by PCR, Nasal     Status: None   Collection Time: 08/13/21  6:36 AM   Specimen: Nasal Mucosa; Nasal Swab  Result Value Ref Range Status   MRSA by PCR Next Gen NOT DETECTED NOT DETECTED Final    Comment: (NOTE) The GeneXpert MRSA Assay (FDA approved for NASAL specimens only), is one component of a comprehensive  MRSA colonization surveillance program. It is not intended to diagnose MRSA infection nor to guide or monitor treatment for MRSA infections. Test performance is not FDA approved in patients less than 53 years old. Performed at [facility], [place] [address], [place], [place] [address]      Radiology Studies: CT ABDOMEN PELVIS WO CONTRAST  Result Date: 08/12/2021 CLINICAL DATA:  Nephrostomy tube dislodgement, recent procedure, concern for complication and/or sepsis. EXAM: CT ABDOMEN AND PELVIS WITHOUT CONTRAST TECHNIQUE: Multidetector CT imaging of the abdomen and pelvis was performed following the standard protocol without IV contrast. RADIATION DOSE REDUCTION: This exam was performed according to the departmental dose-optimization program which includes automated exposure control, adjustment of the mA and/or kV according to patient size and/or use of iterative  reconstruction technique. COMPARISON:  CT July 16, 2021. FINDINGS: Lower chest: Hypoventilatory change in the lung bases. Hepatobiliary: Hepatic cysts. Gallbladder is unremarkable. No biliary ductal dilation. Pancreas: No pancreatic ductal dilation or evidence of acute inflammation. Spleen: No splenomegaly or focal splenic lesion. Adrenals/Urinary Tract: Bilateral adrenal glands are unremarkable. Left percutaneous nephrostomy tube with pigtail coiled in the renal pelvis. No hydronephrosis. Nonobstructive left renal calculi measure up to 2 mm. Left nephroureterostomy tube with pigtails coiled in the renal pelvis and urinary bladder without hydronephrosis. Nonobstructive right renal stones measure up to 4 mm. Gas in the urinary bladder which is decompressed around a Foley catheter. Stomach/Bowel: No radiopaque enteric contrast material was administered. Stomach is nondistended limiting evaluation. No pathologic dilation of small or large bowel. Right-sided colonic diverticulosis with inflammatory stranding along the cecum/ascending colon. Noninflamed appendix is visualized. Vascular/Lymphatic: Aortic and branch vessel atherosclerosis without abdominal aortic aneurysm. No pathologically enlarged abdominal or pelvic lymph nodes within the limitation of no intravenous contrast material and relative paucity of peritoneal fat. Reproductive: Enlarged prostate gland. Other: No walled off fluid collections. No pneumoperitoneum. Small bilateral inguinal hernias both containing fat and a short segment of nonobstructed bowel. Musculoskeletal: Multilevel degenerative changes spine most severe at L4-L5 marked discogenic disease and Modic type endplate changes. IMPRESSION: 1. There is inflammatory stranding along the cecum and ascending colon the etiology of which is difficult to determine given the relative paucity of abdominal fat and lack of intravenous contrast material. There is no significant right-sided perinephric  stranding and the appendix appears normal. However, there are right-sided colonic diverticula, as such these findings are most compatible with acute uncomplicated diverticulitis. 2. Left percutaneous nephrostomy tube with pigtails coiled in the renal pelvis and urinary bladder without hydronephrosis. 3. Left nephroureterostomy tube with pigtails coiled in the renal pelvis and urinary bladder without hydronephrosis. 4. Bilateral nonobstructive renal stones. 5. Small bilateral inguinal hernias both containing fat and a short segment of nonobstructed bowel. 6.  Aortic Atherosclerosis (ICD10-I70.0). Electronically Signed   By: Dahlia Bailiff M.D.   On: 08/12/2021 16:31   DG Chest Portable 1 View  Result Date: 08/12/2021 CLINICAL DATA:  Cough EXAM: PORTABLE CHEST 1 VIEW COMPARISON:  06/25/2021 FINDINGS: Cardiac size is within normal limits. Thoracic aorta is tortuous. There are no signs of pulmonary edema or focal pulmonary consolidation. There is no pleural effusion or pneumothorax. IMPRESSION: No active disease. Electronically Signed   By: Elmer Picker M.D.   On: 08/12/2021 16:13     Marzetta Board, MD, PhD [facility]  Between 7 am - 7 pm I am available, please contact me via Amion (for emergencies) or Securechat (non urgent messages)  Between 7 pm - 7 am I am not  available, please contact night coverage MD/APP via Amion

## 2021-08-13 NOTE — Progress Notes (Signed)
Called by Primary RN with update on patient and labs:  Temp is 98.0 and HR in 115 range.  Lactic Acid is 2.0 and mag 1.3.   Ask to call provider for potential Mg replacement and one more Lactic acid lab left to ensure patient is heading in the right direct for there Sepsis

## 2021-08-13 NOTE — Progress Notes (Signed)
PHARMACY - PHYSICIAN COMMUNICATION CRITICAL VALUE ALERT - BLOOD CULTURE IDENTIFICATION (BCID)  Ian Boyd is an 71 y.o. male who presented to [facility] on 08/12/2021 with a chief complaint of fever. Patient recently hospitalized with E.coli bacteremia and found to have psoas abscess. Drain was placed. Patient was following with ID with plan to be on cefadroxil until 5/25. On 5/22 he had right stone extraction and ureteral stent placement. He then developed fever and tachycardia the next day.  Assessment:   2/4 bottles (2/2 sets) pseudomonas Urine culture with same  Name of physician (or Provider) Contacted: Dr. Cruzita Lederer  Current antibiotics: vancomycin, cefepime  Changes to prescribed antibiotics recommended: MD wants to continue vancomycin and discontinue tomorrow  Results for orders placed or performed during the hospital encounter of 08/12/21  Blood Culture ID Panel (Reflexed) (Collected: 08/12/2021  3:52 PM)  Result Value Ref Range   Enterococcus faecalis NOT DETECTED NOT DETECTED   Enterococcus Faecium NOT DETECTED NOT DETECTED   Listeria monocytogenes NOT DETECTED NOT DETECTED   Staphylococcus species NOT DETECTED NOT DETECTED   Staphylococcus aureus (BCID) NOT DETECTED NOT DETECTED   Staphylococcus epidermidis NOT DETECTED NOT DETECTED   Staphylococcus lugdunensis NOT DETECTED NOT DETECTED   Streptococcus species NOT DETECTED NOT DETECTED   Streptococcus agalactiae NOT DETECTED NOT DETECTED   Streptococcus pneumoniae NOT DETECTED NOT DETECTED   Streptococcus pyogenes NOT DETECTED NOT DETECTED   A.calcoaceticus-baumannii NOT DETECTED NOT DETECTED   Bacteroides fragilis NOT DETECTED NOT DETECTED   Enterobacterales NOT DETECTED NOT DETECTED   Enterobacter cloacae complex NOT DETECTED NOT DETECTED   Escherichia coli NOT DETECTED NOT DETECTED   Klebsiella aerogenes NOT DETECTED NOT DETECTED   Klebsiella oxytoca NOT DETECTED NOT DETECTED   Klebsiella pneumoniae NOT DETECTED  NOT DETECTED   Proteus species NOT DETECTED NOT DETECTED   Salmonella species NOT DETECTED NOT DETECTED   Serratia marcescens NOT DETECTED NOT DETECTED   Haemophilus influenzae NOT DETECTED NOT DETECTED   Neisseria meningitidis NOT DETECTED NOT DETECTED   Pseudomonas aeruginosa DETECTED (A) NOT DETECTED   Stenotrophomonas maltophilia NOT DETECTED NOT DETECTED   Candida albicans NOT DETECTED NOT DETECTED   Candida auris NOT DETECTED NOT DETECTED   Candida glabrata NOT DETECTED NOT DETECTED   Candida krusei NOT DETECTED NOT DETECTED   Candida parapsilosis NOT DETECTED NOT DETECTED   Candida tropicalis NOT DETECTED NOT DETECTED   Cryptococcus neoformans/gattii NOT DETECTED NOT DETECTED   CTX-M ESBL NOT DETECTED NOT DETECTED   Carbapenem resistance IMP NOT DETECTED NOT DETECTED   Carbapenem resistance KPC NOT DETECTED NOT DETECTED   Carbapenem resistance NDM NOT DETECTED NOT DETECTED   Carbapenem resistance VIM NOT DETECTED NOT [place], PharmD, BCPS Clinical Pharmacist 08/13/2021 2:20 PM

## 2021-08-13 NOTE — Progress Notes (Signed)
RN provided ice packs to arm pits, removed extra blanket, lowered the room temperature, and provided Tylenol. RN educated reasons for each intervention to which the pt stated agreement. Temp recheck an hour later 99.5 F. RN provided cool wash cloth and will continue to monitor.

## 2021-08-13 NOTE — TOC Initial Note (Signed)
Transition of Care [facility]) - Initial/Assessment Note    Patient Details  Name: Ian Boyd MRN: [id number] Date of Birth: 06-10-1950  Transition of Care [facility]) CM/SW Contact:    Dessa Phi, RN Phone Number: 08/13/2021, 12:41 PM  Clinical Narrative: From [facility] to return.                        Patient Goals and CMS Choice Patient states their goals for this hospitalization and ongoing recovery are:: [facility] CMS Medicare.gov Compare Post Acute Care list provided to:: Patient Represenative (must comment) Choice offered to / list presented to : Sibling  Expected Discharge Plan and Services     Discharge Planning Services: CM Consult   Living arrangements for the past 2 months: [place]                                      Prior Living Arrangements/Services Living arrangements for the past 2 months: [place]                     Activities of Daily Living Home Assistive Devices/Equipment: Eyeglasses, Dentures (specify type) (full upper denture, lower partial plate) ADL Screening (condition at time of admission) Patient's cognitive ability adequate to safely complete daily activities?: Yes Is the patient deaf or have difficulty hearing?: No Does the patient have difficulty seeing, even when wearing glasses/contacts?: Yes (blind in right eye) Does the patient have difficulty concentrating, remembering, or making decisions?: Yes (some memory trouble) Patient able to express need for assistance with ADLs?: Yes Does the patient have difficulty dressing or bathing?: Yes Independently performs ADLs?: No Communication: Independent Dressing (OT): Needs assistance Is this a change from baseline?: Pre-admission baseline (needs help with socks) Grooming: Independent Feeding: Independent Bathing: Independent Toileting: Independent In/Out Bed: Independent Walks in Home: Independent Does the patient have  difficulty walking or climbing stairs?: No Weakness of Legs: Both Weakness of Arms/Hands: Both  Permission Sought/Granted                  Emotional Assessment              Admission diagnosis:  Sepsis ([place]) [A41.9] Urinary tract infection without hematuria, site unspecified [N39.0] Sepsis, due to unspecified organism, unspecified whether acute organ dysfunction present [facility]) [A41.9] Patient Active Problem List   Diagnosis Date Noted   Sepsis ([place]) 08/12/2021   Hyponatremia 07/01/2021   Thrombocytopenia ([place]) 06/27/2021   Hypokalemia 06/27/2021   E coli bacteremia 06/26/2021   Psoas abscess, right ([place]) 06/25/2021   Hydronephrosis of left kidney 06/25/2021   Urinary tract infection 06/25/2021   Cocaine use 06/25/2021   Rotator cuff tear arthropathy 06/25/2021   Nephrolithiasis 06/25/2021   AKI (acute kidney injury) ([place]) 06/25/2021   Esophageal dysphagia 05/04/2018   Dysphagia 05/02/2018   Hepatitis C 05/02/2018   PCP:  Curlene Labrum, MD Pharmacy:   [facility] [address] [place] [address] Phone: [phone] Fax: [phone]     Social Determinants of Health (SDOH) Interventions    Readmission Risk Interventions     View : No data to display.

## 2021-08-14 ENCOUNTER — Inpatient Hospital Stay (HOSPITAL_COMMUNITY): Payer: Medicare Other

## 2021-08-14 DIAGNOSIS — R6520 Severe sepsis without septic shock: Secondary | ICD-10-CM | POA: Diagnosis not present

## 2021-08-14 DIAGNOSIS — A4152 Sepsis due to Pseudomonas: Secondary | ICD-10-CM | POA: Diagnosis not present

## 2021-08-14 DIAGNOSIS — N179 Acute kidney failure, unspecified: Secondary | ICD-10-CM | POA: Diagnosis not present

## 2021-08-14 LAB — COMPREHENSIVE METABOLIC PANEL
ALT: 23 U/L (ref 0–44)
AST: 24 U/L (ref 15–41)
Albumin: 2.2 g/dL — ABNORMAL LOW (ref 3.5–5.0)
Alkaline Phosphatase: 60 U/L (ref 38–126)
Anion gap: 3 — ABNORMAL LOW (ref 5–15)
BUN: 22 mg/dL (ref 8–23)
CO2: 21 mmol/L — ABNORMAL LOW (ref 22–32)
Calcium: 8 mg/dL — ABNORMAL LOW (ref 8.9–10.3)
Chloride: 116 mmol/L — ABNORMAL HIGH (ref 98–111)
Creatinine, Ser: 1.43 mg/dL — ABNORMAL HIGH (ref 0.61–1.24)
GFR, Estimated: 52 mL/min — ABNORMAL LOW (ref >60)
Glucose, Bld: 93 mg/dL (ref 70–99)
Potassium: 3.6 mmol/L (ref 3.5–5.1)
Sodium: 140 mmol/L (ref 135–145)
Total Bilirubin: 0.6 mg/dL (ref 0.3–1.2)
Total Protein: 5.7 g/dL — ABNORMAL LOW (ref 6.5–8.1)

## 2021-08-14 LAB — VANCOMYCIN, RANDOM: Vancomycin Rm: <4

## 2021-08-14 LAB — CBC
HCT: 30.8 % — ABNORMAL LOW (ref 39.0–52.0)
Hemoglobin: 9.7 g/dL — ABNORMAL LOW (ref 13.0–17.0)
MCH: 26.6 pg (ref 26.0–34.0)
MCHC: 31.5 g/dL (ref 30.0–36.0)
MCV: 84.6 fL (ref 80.0–100.0)
Platelets: 217 10*3/uL (ref 150–400)
RBC: 3.64 MIL/uL — ABNORMAL LOW (ref 4.22–5.81)
RDW: 16 % — ABNORMAL HIGH (ref 11.5–15.5)
WBC: 23.2 10*3/uL — ABNORMAL HIGH (ref 4.0–10.5)
nRBC: 0 % (ref 0.0–0.2)

## 2021-08-14 LAB — URINE CULTURE: Culture: >=100000 — AB

## 2021-08-14 LAB — MAGNESIUM: Magnesium: 2.1 mg/dL (ref 1.7–2.4)

## 2021-08-14 MED ORDER — ADULT MULTIVITAMIN W/MINERALS CH
1.0000 | ORAL_TABLET | Freq: Every day | ORAL | Status: DC
  Administered 2021-08-14 – 2021-08-20 (×7): 1 via ORAL
  Filled 2021-08-14 (×7): qty 1

## 2021-08-14 NOTE — Progress Notes (Addendum)
Initial Nutrition Assessment  DOCUMENTATION CODES:   Not applicable  INTERVENTION:  - continue Ensure Plus High Protein once/day, each supplement provides 350 kcal and 20 grams of protein.  - will order [employer] with 2% milk with dinner meals, each supplement provides 260 kcal and 13 grams protein.   - will order 1 tablet multivitamin with minerals/day.    NUTRITION DIAGNOSIS:   Increased nutrient needs related to acute illness as evidenced by estimated needs.  GOAL:   Patient will meet greater than or equal to 90% of their needs  MONITOR:   PO intake, Supplement acceptance, Labs, Weight trends  REASON FOR ASSESSMENT:   Malnutrition Screening Tool  ASSESSMENT:   71 y.o. male with medical history of HTN, recent psoas abscess, COPD, nephrolithiasis, R eye blindness, acid reflux, hepatitis C, glaucoma, COPD, rotator cuff tear, and BPH. He presented to the ED from [facility] due to fever and tachycardia. He was hospitalized in 06/2021 due to E.coli bacteremia. He underwent treatment for nephrolithiasis on 08/11/21 with cystoscopy, R ureteroscopy, stone extraction, and ureteral stent placement. In the ED he was found to have bacteremia and UTI and was admitted for sepsis.  Patient laying in bed with his sister at bedside. Patient has been at [facility] for rehab. He does not care for the food there and his sister or his son will take him food when they are able.   Patient has had a decreased appetite and eating smaller amount for a prolonged period of time (amount of time unable to be determined). He has been eating small portions since admission.  Yesterday he ate 100% of breakfast, 25% of lunch, and 25% of dinner. He ate a small portion of a late breakfast today.   Weight on 5/23 was 140 lb and weight on 06/29/21 was 127 lb. This indicates 13 lb weight loss in the past one and a half months.   Ensure is ordered BID. Patient thinks he may receive something  similar at [facility] and is ok with receiving it. He shares that he does find them to be sweet and is receptive to trying [employer] to determine if this is better suited to his tastes.     Labs reviewed; Cl: 116 mmol/l, creatinine: 1.43 mg/dl, Ca: 8 mg/dl, GFR: 52 ml/min. Medications reviewed; 2 g IV Mg sulfate x1 run 5/24.     NUTRITION - FOCUSED PHYSICAL EXAM:  Flowsheet Row Most Recent Value  Orbital Region No depletion  Upper Arm Region No depletion  Thoracic and Lumbar Region Unable to assess  Buccal Region Mild depletion  Temple Region Mild depletion  Clavicle Bone Region Mild depletion  Clavicle and Acromion Bone Region No depletion  Scapular Bone Region No depletion  Dorsal Hand No depletion  Patellar Region No depletion  Anterior Thigh Region No depletion  Posterior Calf Region Mild depletion  Edema (RD Assessment) None  Hair Reviewed  Eyes Reviewed  Mouth Reviewed  Skin Reviewed  Nails Reviewed       Diet Order:   Diet Order             Diet Heart Room service appropriate? Yes; Fluid consistency: Thin  Diet effective now                   EDUCATION NEEDS:   Education needs have been addressed  Skin:  Skin Assessment: Reviewed RN Assessment  Last BM:  PTA/unknown  Height:   Ht Readings from Last 1 Encounters:  08/12/21  6' (1.829 m)    Weight:   Wt Readings from Last 1 Encounters:  08/12/21 63.6 kg     BMI:  Body mass index is 19.02 kg/m.  Estimated Nutritional Needs:  Kcal:  1910-2110 kcal Protein:  95-110 grams Fluid:  >/= 2.2 L/day     Jarome Matin, MS, RD, LDN Registered Dietitian II Inpatient Clinical Nutrition RD pager # and on-call/weekend pager # available in [facility]

## 2021-08-14 NOTE — Progress Notes (Signed)
Subjective: Denies pain, no nausea or emesis. Tmax 100.5, current 98.   Objective: Vital signs in last 24 hours: Temp:  [97.9 F (36.6 C)-100.5 F (38.1 C)] 97.9 F (36.6 C) (05/25 0421) Pulse Rate:  [85-122] 85 (05/25 0421) Resp:  [13-20] 13 (05/25 0806) BP: (120-148)/(66-91) 137/84 (05/25 0421) SpO2:  [95 %-99 %] 98 % (05/25 0806)  Intake/Output from previous day: 05/24 0701 - 05/25 0700 In: 3052.7 [P.O.:720; I.V.:2132.7; IV Piggyback:200] Out: 1625 [Urine:1625] Intake/Output this shift: No intake/output data recorded.  UOP: 1.6L  Physical Exam:  General: Alert and oriented CV: RRR Lungs: Clear Abdomen: Soft, ND, NT, No CVA tenderness Ext: NT, No erythema  Lab Results: Recent Labs    08/12/21 1552 08/13/21 0105 08/14/21 0449  HGB 11.0* 10.2* 9.7*  HCT 34.5* 31.8* 30.8*   BMET Recent Labs    08/13/21 0105 08/14/21 0449  NA 138 140  K 4.1 3.6  CL 108 116*  CO2 22 21*  GLUCOSE 99 93  BUN 21 22  CREATININE 1.75* 1.43*  CALCIUM 8.2* 8.0*     Studies/Results: CT ABDOMEN PELVIS WO CONTRAST  Result Date: 08/12/2021 CLINICAL DATA:  Nephrostomy tube dislodgement, recent procedure, concern for complication and/or sepsis. EXAM: CT ABDOMEN AND PELVIS WITHOUT CONTRAST TECHNIQUE: Multidetector CT imaging of the abdomen and pelvis was performed following the standard protocol without IV contrast. RADIATION DOSE REDUCTION: This exam was performed according to the departmental dose-optimization program which includes automated exposure control, adjustment of the mA and/or kV according to patient size and/or use of iterative reconstruction technique. COMPARISON:  CT July 16, 2021. FINDINGS: Lower chest: Hypoventilatory change in the lung bases. Hepatobiliary: Hepatic cysts. Gallbladder is unremarkable. No biliary ductal dilation. Pancreas: No pancreatic ductal dilation or evidence of acute inflammation. Spleen: No splenomegaly or focal splenic lesion. Adrenals/Urinary  Tract: Bilateral adrenal glands are unremarkable. Left percutaneous nephrostomy tube with pigtail coiled in the renal pelvis. No hydronephrosis. Nonobstructive left renal calculi measure up to 2 mm. Left nephroureterostomy tube with pigtails coiled in the renal pelvis and urinary bladder without hydronephrosis. Nonobstructive right renal stones measure up to 4 mm. Gas in the urinary bladder which is decompressed around a Foley catheter. Stomach/Bowel: No radiopaque enteric contrast material was administered. Stomach is nondistended limiting evaluation. No pathologic dilation of small or large bowel. Right-sided colonic diverticulosis with inflammatory stranding along the cecum/ascending colon. Noninflamed appendix is visualized. Vascular/Lymphatic: Aortic and branch vessel atherosclerosis without abdominal aortic aneurysm. No pathologically enlarged abdominal or pelvic lymph nodes within the limitation of no intravenous contrast material and relative paucity of peritoneal fat. Reproductive: Enlarged prostate gland. Other: No walled off fluid collections. No pneumoperitoneum. Small bilateral inguinal hernias both containing fat and a short segment of nonobstructed bowel. Musculoskeletal: Multilevel degenerative changes spine most severe at L4-L5 marked discogenic disease and Modic type endplate changes. IMPRESSION: 1. There is inflammatory stranding along the cecum and ascending colon the etiology of which is difficult to determine given the relative paucity of abdominal fat and lack of intravenous contrast material. There is no significant right-sided perinephric stranding and the appendix appears normal. However, there are right-sided colonic diverticula, as such these findings are most compatible with acute uncomplicated diverticulitis. 2. Left percutaneous nephrostomy tube with pigtails coiled in the renal pelvis and urinary bladder without hydronephrosis. 3. Left nephroureterostomy tube with pigtails coiled in  the renal pelvis and urinary bladder without hydronephrosis. 4. Bilateral nonobstructive renal stones. 5. Small bilateral inguinal hernias both containing fat and a short segment of  nonobstructed bowel. 6.  Aortic Atherosclerosis (ICD10-I70.0). Electronically Signed   By: Dahlia Bailiff M.D.   On: 08/12/2021 16:31   DG Chest Portable 1 View  Result Date: 08/12/2021 CLINICAL DATA:  Cough EXAM: PORTABLE CHEST 1 VIEW COMPARISON:  06/25/2021 FINDINGS: Cardiac size is within normal limits. Thoracic aorta is tortuous. There are no signs of pulmonary edema or focal pulmonary consolidation. There is no pleural effusion or pneumothorax. IMPRESSION: No active disease. Electronically Signed   By: Elmer Picker M.D.   On: 08/12/2021 16:13    Assessment/Plan: 1. Sepsis due to UTI after right ureteroscopy with laser lithotripsy for right sided stones   -He was hospitalized in 4/20223 with UTI and required L PCN for left ureteral stone. He remained on cefadroxil at his rehab facility planned through 5/26 to cover him peri-operatively.  -Subsequent CT 07/16/2021 with interval passage of left ureteral stone. Unable to pass a wire through distal left ureter given significant J-hooking of left ureter. Antegrade contrast drained appropriately so left PCN was capped on 08/12/21 AM. -Denies abdominal pain or flank pain. Afebrile. Cr downtrending. -Blood cx with pseudomonas. -Urine cx pseudomonas -Continue broad spectrum abx. Wean as able. -Will plan to remove tubes while hospitalized on IV abx this admission. -Following   LOS: 2 days   Matt R. Karliah Kowalchuk MD 08/14/2021, 9:19 AM [facility]  Pager: [phone]

## 2021-08-14 NOTE — Progress Notes (Signed)
PROGRESS NOTE  Ian Boyd BSJ:[id number] DOB: 1950-09-03 DOA: 08/12/2021 PCP: Curlene Labrum, MD   LOS: 2 days   Brief Narrative / Interim history: 71 y.o. male with medical history significant of hypertension, recent psoas abscess, COPD, nephrolithiasis, [facility] the hospital [facility] the [facility] due to fever and tachycardia.  He was recently hospitalized in April 2023 with E. 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  He had a drain placed at that time, and ID followed patient while hospitalized.  Eventually improved, follow-up with ID as an outpatient about 3 weeks ago, and was supposed to stay on cefadroxil until 5/25.  He underwent treatment for his nephrolithiasis 5/22 with cystoscopy, right ureteroscopy, stone extraction and right ureteral stent placement.  24 hours afterwards developed a fever, tachycardia, and was brought to the hospital.  He was found to have Pseudomonas bacteremia as well as Pseudomonas UTI  Subjective / 24h Interval events: He states he is hungry this morning, breakfast is running late.  No chest pain, no shortness of breath.  No nausea or vomiting  Assesement and Plan: Principal Problem:   Sepsis ([place]) Active Problems:   Psoas abscess, right ([place])   Hydronephrosis of left kidney   AKI (acute kidney injury) ([place])   Urinary tract infection   Nephrolithiasis   Hepatitis C  Principal problem Sepsis due to Pseudomonas UTI and Pseudomonas bacteremia-patient presents 24 hours after instrumentation with elevated WBC, fever, tachycardia and pyuria.  Blood cultures and urine cultures were sent, speciated Pseudomonas.  Initially on broad-spectrum antibiotics with vancomycin and cefepime, now on cefepime alone.  ID consulted. -CT scan of the abdomen and pelvis raised suspicion for diverticulitis however he has no abdominal pain and his exam is unremarkable.    Active problems Nephrolithiasis/bladder  stone, left kidney hydronephrosis-patient just underwent cystoscopy with the prior to admission with laser lithotripsy on the right and extraction of stones, he also had a right ureteral stent placement.  Also underwent a cystoscopy which showed bladder diverticuli.  He also has a left percutaneous nephrostomy tube, initial concern was that it got dislodged earlier today but the CT scan shows it in the right place.  Urology following   Essential hypertension-blood pressure acceptable, continue to hold home antihypertensives   Acute kidney injury-due to sepsis, creatinine improving with fluids, continue fluids   Urinary retention-continue home medications   COPD-stable, no wheezing, continue home inhalers   Recent psoas abscess, recent E. coli bacteremia-was already on antibiotics as an outpatient for 2 additional days with plan to end on 5/25.  ID to see   Right rotator cuff tear-chronic, has been going on for the past 3 years  History of hep C-ID will follow as an outpatient  Scheduled Meds:  Chlorhexidine Gluconate Cloth  6 each Topical Daily   dorzolamide-timolol  1 drop Left Eye BID   feeding supplement  237 mL Oral BID BM   heparin  5,000 Units Subcutaneous Q8H   latanoprost  1 drop Left Eye QHS   Netarsudil Dimesylate  1 drop Left Eye QHS   tamsulosin  0.4 mg Oral QHS   umeclidinium bromide  1 puff Inhalation Daily   Continuous Infusions:  sodium chloride 100 mL/hr at 08/14/21 0220   ceFEPime (MAXIPIME) IV 2 g (08/14/21 0909)   PRN Meds:.acetaminophen **OR** acetaminophen, albuterol, docusate sodium, guaiFENesin-dextromethorphan, lip balm  Diet Orders (From admission, onward)     Start  Ordered   08/12/21 2115  Diet Heart Room service appropriate? Yes; Fluid consistency: Thin  Diet effective now       Question Answer Comment  Room service appropriate? Yes   Fluid consistency: Thin      08/12/21 2115            DVT prophylaxis: heparin injection 5,000 Units  Start: 08/12/21 2200   Lab Results  Component Value Date   PLT 217 08/14/2021      Code Status: Full Code  Family Communication: no family at bedside   Status is: Inpatient  Remains inpatient appropriate because: IV antibiotics, monitor cultures, leukocytosis  Level of care: Progressive  Consultants:  Urology   Procedures:  none  Microbiology  Urine cultures - pseudomonas  Objective: Vitals:   08/13/21 1956 08/14/21 0202 08/14/21 0421 08/14/21 0806  BP: (!) 148/86 (!) 131/91 137/84   Pulse: (!) 113 95 85   Resp: '20 18 18 13  '$ Temp: (!) 100.5 F (38.1 C) 99.1 F (37.3 C) 97.9 F (36.6 C)   TempSrc: Oral Oral Oral   SpO2: 95% 95% 98% 98%  Weight:      Height:        Intake/Output Summary (Last 24 hours) at 08/14/2021 1009 Last data filed at 08/14/2021 0434 Gross per 24 hour  Intake 2812.7 ml  Output 1175 ml  Net 1637.7 ml    Wt Readings from Last 3 Encounters:  08/12/21 63.6 kg  08/11/21 57.6 kg  06/29/21 57.3 kg    Examination:  Constitutional: NAD Eyes: lids and conjunctivae normal, no scleral icterus ENMT: mmm Neck: normal, supple Respiratory: clear to auscultation bilaterally, no wheezing, no crackles. Normal respiratory effort.  Cardiovascular: Regular rate and rhythm, no murmurs / rubs / gallops. No LE edema. Abdomen: soft, no distention, no tenderness. Bowel sounds positive.  Skin: no rashes Neurologic: no focal deficits, equal strength   Data Reviewed: I have independently reviewed following labs and imaging studies  CBC Recent Labs  Lab 08/11/21 1158 08/12/21 1552 08/13/21 0105 08/14/21 0449  WBC 9.0 26.7* 33.2* 23.2*  HGB 12.3* 11.0* 10.2* 9.7*  HCT 41.2 34.5* 31.8* 30.8*  PLT 380 320 302 217  MCV 90.7 85.2 82.6 84.6  MCH 27.1 27.2 26.5 26.6  MCHC 29.9* 31.9 32.1 31.5  RDW 15.7* 15.9* 16.1* 16.0*  LYMPHSABS  --  1.0  --   --   MONOABS  --  2.0*  --   --   EOSABS  --  0.0  --   --   BASOSABS  --  0.0  --   --       Recent Labs  Lab 08/11/21 1158 08/12/21 1552 08/12/21 1552 08/12/21 1601 08/12/21 1838 08/12/21 2232 08/13/21 0105 08/13/21 0436 08/13/21 0658 08/14/21 0449  NA 139 137  --   --   --   --  138  --   --  140  K 4.1 4.1  --   --   --   --  4.1  --   --  3.6  CL 107 105  --   --   --   --  108  --   --  116*  CO2 22 22  --   --   --   --  22  --   --  21*  GLUCOSE 93 119*  --   --   --   --  99  --   --  93  BUN 12  20  --   --   --   --  21  --   --  22  CREATININE 1.15 1.79*  --   --   --   --  1.75*  --   --  1.43*  CALCIUM 9.2 8.4*  --   --   --   --  8.2*  --   --  8.0*  AST 50* 54*  --   --   --   --  51*  --   --  24  ALT 41 39  --   --   --   --  38  --   --  23  ALKPHOS 102 83  --   --   --   --  70  --   --  60  BILITOT 0.6 0.7  --   --   --   --  1.0  --   --  0.6  ALBUMIN 3.3* 2.8*  --   --   --   --  2.5*  --   --  2.2*  MG  --   --   --   --   --   --   --  1.3*  --  2.1  LATICACIDVEN  --  4.3*   < >  --  3.4* 4.0* 2.6* 2.0* 2.3*  --   INR  --   --   --  1.2  --   --   --   --   --   --    < > = values in this interval not displayed.     ------------------------------------------------------------------------------------------------------------------ No results for input(s): CHOL, HDL, LDLCALC, TRIG, CHOLHDL, LDLDIRECT in the last 72 hours.  No results found for: HGBA1C ------------------------------------------------------------------------------------------------------------------ No results for input(s): TSH, T4TOTAL, T3FREE, THYROIDAB in the last 72 hours.  Invalid input(s): FREET3  Cardiac Enzymes No results for input(s): CKMB, TROPONINI, MYOGLOBIN in the last 168 hours.  Invalid input(s): CK ------------------------------------------------------------------------------------------------------------------ No results found for: BNP  CBG: No results for input(s): GLUCAP in the last 168 hours.  Recent Results (from the past 240 hour(s))  Blood  culture (routine x 2)     Status: Abnormal (Preliminary result)   Collection Time: 08/12/21  3:52 PM   Specimen: BLOOD  Result Value Ref Range Status   Specimen Description   Final    BLOOD LEFT ANTECUBITAL Performed at [facility] [address], [place], [place] [address]    Special Requests   Final    BOTTLES DRAWN AEROBIC AND ANAEROBIC Blood Culture adequate volume Performed at [facility] [address], [place], [place] [address]    Culture  Setup Time   Final    GRAM NEGATIVE RODS IN BOTH AEROBIC AND ANAEROBIC BOTTLES CRITICAL RESULT CALLED TO, READ BACK BY AND VERIFIED WITH: PHARMD A ELLINGTON [id number] FCP    Culture (A)  Final    PSEUDOMONAS AERUGINOSA SUSCEPTIBILITIES TO FOLLOW Performed at [facility], [place] [address] [place], [place] [address]    Report Status PENDING  Incomplete  Blood culture (routine x 2)     Status: None (Preliminary result)   Collection Time: 08/12/21  3:52 PM   Specimen: BLOOD  Result Value Ref Range Status   Specimen Description   Final    BLOOD LEFT ANTECUBITAL Performed at [facility] [address] [place], [place] [address]    Special Requests   Final  BOTTLES DRAWN AEROBIC AND ANAEROBIC Blood Culture adequate volume Performed at [facility] [address] [place], [place] [address]    Culture  Setup Time   Final    GRAM NEGATIVE RODS AEROBIC BOTTLE ONLY CRITICAL VALUE NOTED.  VALUE IS CONSISTENT WITH PREVIOUSLY REPORTED AND CALLED VALUE. Performed at [facility], [place] [address] [place], [place] [address]    Culture GRAM NEGATIVE RODS  Final   Report Status PENDING  Incomplete  Blood Culture ID Panel (Reflexed)     Status: Abnormal   Collection Time: 08/12/21  3:52 PM  Result Value Ref Range Status   Enterococcus faecalis NOT DETECTED NOT DETECTED Final   Enterococcus Faecium NOT DETECTED NOT DETECTED Final   Listeria  monocytogenes NOT DETECTED NOT DETECTED Final   Staphylococcus species NOT DETECTED NOT DETECTED Final   Staphylococcus aureus (BCID) NOT DETECTED NOT DETECTED Final   Staphylococcus epidermidis NOT DETECTED NOT DETECTED Final   Staphylococcus lugdunensis NOT DETECTED NOT DETECTED Final   Streptococcus species NOT DETECTED NOT DETECTED Final   Streptococcus agalactiae NOT DETECTED NOT DETECTED Final   Streptococcus pneumoniae NOT DETECTED NOT DETECTED Final   Streptococcus pyogenes NOT DETECTED NOT DETECTED Final   A.calcoaceticus-baumannii NOT DETECTED NOT DETECTED Final   Bacteroides fragilis NOT DETECTED NOT DETECTED Final   Enterobacterales NOT DETECTED NOT DETECTED Final   Enterobacter cloacae complex NOT DETECTED NOT DETECTED Final   Escherichia coli NOT DETECTED NOT DETECTED Final   Klebsiella aerogenes NOT DETECTED NOT DETECTED Final   Klebsiella oxytoca NOT DETECTED NOT DETECTED Final   Klebsiella pneumoniae NOT DETECTED NOT DETECTED Final   Proteus species NOT DETECTED NOT DETECTED Final   Salmonella species NOT DETECTED NOT DETECTED Final   Serratia marcescens NOT DETECTED NOT DETECTED Final   Haemophilus influenzae NOT DETECTED NOT DETECTED Final   Neisseria meningitidis NOT DETECTED NOT DETECTED Final   Pseudomonas aeruginosa DETECTED (A) NOT DETECTED Final    Comment: CRITICAL RESULT CALLED TO, READ BACK BY AND VERIFIED WITH: PHARMD A ELLINGTON [id number] FCP    Stenotrophomonas maltophilia NOT DETECTED NOT DETECTED Final   Candida albicans NOT DETECTED NOT DETECTED Final   Candida auris NOT DETECTED NOT DETECTED Final   Candida glabrata NOT DETECTED NOT DETECTED Final   Candida krusei NOT DETECTED NOT DETECTED Final   Candida parapsilosis NOT DETECTED NOT DETECTED Final   Candida tropicalis NOT DETECTED NOT DETECTED Final   Cryptococcus neoformans/gattii NOT DETECTED NOT DETECTED Final   CTX-M ESBL NOT DETECTED NOT DETECTED Final   Carbapenem resistance IMP NOT  DETECTED NOT DETECTED Final   Carbapenem resistance KPC NOT DETECTED NOT DETECTED Final   Carbapenem resistance NDM NOT DETECTED NOT DETECTED Final   Carbapenem resistance VIM NOT DETECTED NOT DETECTED Final    Comment: Performed at [facility], [address] [place], [place] [address]  Urine Culture     Status: Abnormal   Collection Time: 08/12/21  4:25 PM   Specimen: Urine, Clean Catch  Result Value Ref Range Status   Specimen Description   Final    URINE, CLEAN CATCH Performed at [facility], [place] [address] [place] [address], [place], [place] [address]    Special Requests   Final    NONE Performed at [facility], [place] [address] [place], [place] [address]    Culture >=100,000 COLONIES/mL PSEUDOMONAS AERUGINOSA (A)  Final   Report Status 08/14/2021 FINAL  Final   Organism ID, Bacteria PSEUDOMONAS  AERUGINOSA (A)  Final      Susceptibility   Pseudomonas aeruginosa - MIC*    CEFTAZIDIME 4 SENSITIVE Sensitive     CIPROFLOXACIN 0.5 SENSITIVE Sensitive     GENTAMICIN 4 SENSITIVE Sensitive     IMIPENEM 2 SENSITIVE Sensitive     * >=100,000 COLONIES/mL PSEUDOMONAS AERUGINOSA  Resp Panel by RT-PCR (Flu A&B, Covid) Nasopharyngeal Swab     Status: None   Collection Time: 08/12/21  4:25 PM   Specimen: Nasopharyngeal Swab; Nasopharyngeal(NP) swabs in vial transport medium  Result Value Ref Range Status   SARS Coronavirus 2 by RT PCR NEGATIVE NEGATIVE Final    Comment: (NOTE) SARS-CoV-2 target nucleic acids are NOT DETECTED.  The SARS-CoV-2 RNA is generally detectable in upper respiratory specimens during the acute phase of infection. The lowest concentration of SARS-CoV-2 viral copies this assay can detect is 138 copies/mL. A negative result does not preclude SARS-Cov-2 infection and should not be used as the sole basis for treatment or other patient management decisions. A negative result may occur with  improper specimen collection/handling, submission  of specimen other than nasopharyngeal swab, presence of viral mutation(s) within the areas targeted by this assay, and inadequate number of viral copies(<138 copies/mL). A negative result must be combined with clinical observations, patient history, and epidemiological information. The expected result is Negative.  Fact Sheet for Patients:  EntrepreneurPulse.com.au  Fact Sheet for Healthcare Providers:  IncredibleEmployment.be  This test is no t yet approved or cleared by the [place] FDA and  has been authorized for detection and/or diagnosis of SARS-CoV-2 by FDA under an Emergency Use Authorization (EUA). This EUA will remain  in effect (meaning this test can be used) for the duration of the COVID-19 declaration under Section 564(b)(1) of the Act, 21 U.S.C.section 360bbb-3(b)(1), unless the authorization is terminated  or revoked sooner.       Influenza A by PCR NEGATIVE NEGATIVE Final   Influenza B by PCR NEGATIVE NEGATIVE Final    Comment: (NOTE) The Xpert Xpress SARS-CoV-2/FLU/RSV plus assay is intended as an aid in the diagnosis of influenza from Nasopharyngeal swab specimens and should not be used as a sole basis for treatment. Nasal washings and aspirates are unacceptable for Xpert Xpress SARS-CoV-2/FLU/RSV testing.  Fact Sheet for Patients: EntrepreneurPulse.com.au  Fact Sheet for Healthcare Providers: IncredibleEmployment.be  This test is not yet approved or cleared by the [place] FDA and has been authorized for detection and/or diagnosis of SARS-CoV-2 by FDA under an Emergency Use Authorization (EUA). This EUA will remain in effect (meaning this test can be used) for the duration of the COVID-19 declaration under Section 564(b)(1) of the Act, 21 U.S.C. section 360bbb-3(b)(1), unless the authorization is terminated or revoked.  Performed at [facility], [place]  [address], [place], [place] [address]   MRSA Next Gen by PCR, Nasal     Status: None   Collection Time: 08/13/21  6:36 AM   Specimen: Nasal Mucosa; Nasal Swab  Result Value Ref Range Status   MRSA by PCR Next Gen NOT DETECTED NOT DETECTED Final    Comment: (NOTE) The GeneXpert MRSA Assay (FDA approved for NASAL specimens only), is one component of a comprehensive MRSA colonization surveillance program. It is not intended to diagnose MRSA infection nor to guide or monitor treatment for MRSA infections. Test performance is not FDA approved in patients less than 28 years old. Performed at [facility], [place] [address], [place], [place] [address]  Radiology Studies: No results found.   Marzetta Board, MD, PhD [facility]  Between 7 am - 7 pm I am available, please contact me via Amion (for emergencies) or Securechat (non urgent messages)  Between 7 pm - 7 am I am not available, please contact night coverage MD/APP via Amion

## 2021-08-14 NOTE — Consult Note (Addendum)
[name]    Date of Admission:  08/12/2021     Reason for Consult: pseudomonas complicated uti/bacteremia    Referring Provider: Waldron Session   Lines/catheter: Left nephrostomy tube Indwelling foley Bilateral ureteral stent  Abx: 5/24-c cefepime  5/23 vanc/ceftriaxone         Assessment: 71 yo male copd, hep c, admitted 4/5-14 previously with ecoli bacteremia/uti complicated by psoas abscess right side in setting of obstructive hydronephrosis from stone (left vesicoureteral junction s/p stent and left perc nephrostomy tube) -- had right psoas drain placed and since removed; discharged to snf on cefadroxil after initial iv abx planned until 5/25 -- readmitted 5/23 due to recurrent sepsis with pseudomonas bacteremia/uti after recent right ureteroscopy and laser lithotripsy of right sided stone on 5/22  Repeat ct 5/23 showed nonobstructive bilateral renal stones. Urology evaluated and continue to have the nephrostomy tube capped (since 5/23), as he is draining well from the previously placed foley catheter. He has right ureteral stent placed 5/22 and previously placed left ureteral stent as well. The ct scan didn't mention previous right psoas abscess presence any longer  5/23 bcx pseudomonas aeruginosa pending susceptibility 5/23 ucx PsA (S ceftaz, imipenem, cipro; cefepime mic 8; I pip-tazo)  He is clinically better with cefepime  Of note, chronic hep c. Can f/u outpatient near future for discussion of treatment   Plan: Would treat for 14 days for stent/procedure/stone related pseudomonas uti with bacteremia If he discharge prior to this could transition to ciprofloxacin to finish course He'll likely need to have urology evaluation for further stone removal and stent/nephrostomy tube removal as indicated per urology If the procedure falls outside this 14 days windows, it would be reasonable to do periop prophylaxis with  cefepime Discussed with primary team   I spent 75 minute reviewing data/chart, and coordinating care and >50% direct face to face time providing counseling/discussing diagnostics/treatment plan with patient   ------------------------------------------------ Principal Problem:   Sepsis ([place]) Active Problems:   Hepatitis C   Psoas abscess, right ([place])   Hydronephrosis of left kidney   Urinary tract infection   Nephrolithiasis   AKI (acute kidney injury) ([place])    HPI: Ian Boyd is a 71 y.o. male readmitted from snf 5/23 with sepsis found to have bacteremia pseudomonas and uti  Patient had an admission 4/5-14 for similar presentation at that time with ecoli nonesbl uti and bacteremia and right psoas abscess due to obstructive renal stone. He underwent left ureteral stent/perc nephrostomy and a right psoas abscess drain that had been removed accidentally prior to repeat ct 4/26 (improved size right psoas abscess). He was maintained on cefadroxil in setting right psoas abscess and was doing well on cefadroxil at snf  On 5/22 he underwent previously scheduled procedures: 1.  Cystoscopy 2. Right ureteroscopy with laser lithotripsy and basket extraction of stones 3. Bilateral retrograde pyelogram 4. Left antegrade pyelogram 5. Removal of bladder stones 5. Right ureteral stent placement 6. Fluoroscopy with intraoperative interpretation  Subsequent to procedure developed sepsis at nursing home so readmitted  On admission: Febrile 101s, hds, not hypoxic Leukocytosis 33 Elevated lactate Bcx and ucx as of 5/25 growing pseudomonas Started initially on vanc/ceftriaxone but changed to cefepime 5/25. Was already getting better prior to cefepime  Abd/pelv ct no obstruction or residual right psoas abscess or sign of renal abscess  Other complication include aki but that is improving as well (1.2 --> 1.8 --> 1.4)  History reviewed. No pertinent family history.  Social History    Tobacco Use   Smoking status: Every Day    Packs/day: 0.50    Types: Cigarettes   Smokeless tobacco: Never   Tobacco comments:    1/2 pack a day  Vaping Use   Vaping Use: Never used  Substance Use Topics   Alcohol use: Never    Comment: quit 5-7 yrs ago.    Drug use: Not Currently    Types: Cocaine, Marijuana    Comment: Does marijuana and cocaine.     No Known Allergies  Review of Systems: ROS All Other ROS was negative, except mentioned above   Past Medical History:  Diagnosis Date   Acid reflux    Blind right eye    BPH (benign prostatic hyperplasia)    COPD (chronic obstructive pulmonary disease) (HCC)    Drug abuse (HCC)    Glaucoma    Hepatitis C    History of severe sepsis    Hyponatremia    Psoas abscess, right ([place]) 06/2021   Rotator cuff tear    Chronic right   Sepsis ([place]) 06/2021   E coli bacteremia from a urinary source complicated by obstructive ureteral calculus   Thrombocytopenia (HCC)        Scheduled Meds:  Chlorhexidine Gluconate Cloth  6 each Topical Daily   dorzolamide-timolol  1 drop Left Eye BID   feeding supplement  237 mL Oral BID BM   heparin  5,000 Units Subcutaneous Q8H   latanoprost  1 drop Left Eye QHS   Netarsudil Dimesylate  1 drop Left Eye QHS   tamsulosin  0.4 mg Oral QHS   umeclidinium bromide  1 puff Inhalation Daily   Continuous Infusions:  sodium chloride 100 mL/hr at 08/14/21 0220   ceFEPime (MAXIPIME) IV 2 g (08/14/21 0909)   PRN Meds:.acetaminophen **OR** acetaminophen, albuterol, docusate sodium, guaiFENesin-dextromethorphan, lip balm   OBJECTIVE: Blood pressure 137/84, pulse 85, temperature 97.9 F (36.6 C), temperature source Oral, resp. rate 13, height 6' (1.829 m), weight 63.6 kg, SpO2 98 %.  Physical Exam General/constitutional: no distress, pleasant; generalized weakness HEENT: Normocephalic, PER, Conj Clear, EOMI, Oropharynx clear Neck supple CV: rrr no mrg Lungs: clear to auscultation, normal  respiratory effort Abd: Soft, Nontender Ext: no edema Skin: No Rash Gu: left nephrostomy tube site no tenderness (tube capped); foley in place draining clear urine Neuro: nonfocal; generalized weakness MSK: no peripheral joint swelling/tenderness/warmth; back spines nontender      Lab Results Lab Results  Component Value Date   WBC 23.2 (H) 08/14/2021   HGB 9.7 (L) 08/14/2021   HCT 30.8 (L) 08/14/2021   MCV 84.6 08/14/2021   PLT 217 08/14/2021    Lab Results  Component Value Date   CREATININE 1.43 (H) 08/14/2021   BUN 22 08/14/2021   NA 140 08/14/2021   K 3.6 08/14/2021   CL 116 (H) 08/14/2021   CO2 21 (L) 08/14/2021    Lab Results  Component Value Date   ALT 23 08/14/2021   AST 24 08/14/2021   ALKPHOS 60 08/14/2021   BILITOT 0.6 08/14/2021      Microbiology: Recent Results (from the past 240 hour(s))  Blood culture (routine x 2)     Status: Abnormal (Preliminary result)   Collection Time: 08/12/21  3:52 PM   Specimen: BLOOD  Result Value Ref Range Status   Specimen Description   Final    BLOOD LEFT ANTECUBITAL Performed at [facility],  [place] [address], [place], [place] [address]    Special Requests   Final    BOTTLES DRAWN AEROBIC AND ANAEROBIC Blood Culture adequate volume Performed at [facility] [address] [place], [place] [address]    Culture  Setup Time   Final    GRAM NEGATIVE RODS IN BOTH AEROBIC AND ANAEROBIC BOTTLES CRITICAL RESULT CALLED TO, READ BACK BY AND VERIFIED WITH: PHARMD A ELLINGTON [id number] FCP    Culture (A)  Final    PSEUDOMONAS AERUGINOSA SUSCEPTIBILITIES TO FOLLOW Performed at [facility], [place] [address], [place], [place] [address]    Report Status PENDING  Incomplete  Blood culture (routine x 2)     Status: Abnormal (Preliminary result)   Collection Time: 08/12/21  3:52 PM   Specimen: BLOOD  Result Value Ref Range Status   Specimen Description   Final    BLOOD LEFT  ANTECUBITAL Performed at [facility] [address] [place], [place] [address]    Special Requests   Final    BOTTLES DRAWN AEROBIC AND ANAEROBIC Blood Culture adequate volume Performed at [facility] [address], [place], [place] [address]    Culture  Setup Time   Final    GRAM NEGATIVE RODS AEROBIC BOTTLE ONLY CRITICAL VALUE NOTED.  VALUE IS CONSISTENT WITH PREVIOUSLY REPORTED AND CALLED VALUE. Performed at [facility], [place] [address] [place], [place] [address]    Culture PSEUDOMONAS AERUGINOSA (A)  Final   Report Status PENDING  Incomplete  Blood Culture ID Panel (Reflexed)     Status: Abnormal   Collection Time: 08/12/21  3:52 PM  Result Value Ref Range Status   Enterococcus faecalis NOT DETECTED NOT DETECTED Final   Enterococcus Faecium NOT DETECTED NOT DETECTED Final   Listeria monocytogenes NOT DETECTED NOT DETECTED Final   Staphylococcus species NOT DETECTED NOT DETECTED Final   Staphylococcus aureus (BCID) NOT DETECTED NOT DETECTED Final   Staphylococcus epidermidis NOT DETECTED NOT DETECTED Final   Staphylococcus lugdunensis NOT DETECTED NOT DETECTED Final   Streptococcus species NOT DETECTED NOT DETECTED Final   Streptococcus agalactiae NOT DETECTED NOT DETECTED Final   Streptococcus pneumoniae NOT DETECTED NOT DETECTED Final   Streptococcus pyogenes NOT DETECTED NOT DETECTED Final   A.calcoaceticus-baumannii NOT DETECTED NOT DETECTED Final   Bacteroides fragilis NOT DETECTED NOT DETECTED Final   Enterobacterales NOT DETECTED NOT DETECTED Final   Enterobacter cloacae complex NOT DETECTED NOT DETECTED Final   Escherichia coli NOT DETECTED NOT DETECTED Final   Klebsiella aerogenes NOT DETECTED NOT DETECTED Final   Klebsiella oxytoca NOT DETECTED NOT DETECTED Final   Klebsiella pneumoniae NOT DETECTED NOT DETECTED Final   Proteus species NOT DETECTED NOT DETECTED Final   Salmonella species NOT DETECTED NOT DETECTED  Final   Serratia marcescens NOT DETECTED NOT DETECTED Final   Haemophilus influenzae NOT DETECTED NOT DETECTED Final   Neisseria meningitidis NOT DETECTED NOT DETECTED Final   Pseudomonas aeruginosa DETECTED (A) NOT DETECTED Final    Comment: CRITICAL RESULT CALLED TO, READ BACK BY AND VERIFIED WITH: PHARMD A ELLINGTON [id number] FCP    Stenotrophomonas maltophilia NOT DETECTED NOT DETECTED Final   Candida albicans NOT DETECTED NOT DETECTED Final   Candida auris NOT DETECTED NOT DETECTED Final   Candida glabrata NOT DETECTED NOT DETECTED Final   Candida krusei NOT DETECTED NOT DETECTED Final   Candida parapsilosis NOT DETECTED NOT DETECTED Final   Candida tropicalis NOT DETECTED  NOT DETECTED Final   Cryptococcus neoformans/gattii NOT DETECTED NOT DETECTED Final   CTX-M ESBL NOT DETECTED NOT DETECTED Final   Carbapenem resistance IMP NOT DETECTED NOT DETECTED Final   Carbapenem resistance KPC NOT DETECTED NOT DETECTED Final   Carbapenem resistance NDM NOT DETECTED NOT DETECTED Final   Carbapenem resistance VIM NOT DETECTED NOT DETECTED Final    Comment: Performed at [facility], [place] [address], [place], [place] [address]  Urine Culture     Status: Abnormal   Collection Time: 08/12/21  4:25 PM   Specimen: Urine, Clean Catch  Result Value Ref Range Status   Specimen Description   Final    URINE, CLEAN CATCH Performed at [facility], [place] [address] [place],  [address]    Special Requests   Final    NONE Performed at [facility], [place] [address] [place], [place] [address]    Culture >=100,000 COLONIES/mL PSEUDOMONAS AERUGINOSA (A)  Final   Report Status 08/14/2021 FINAL  Final   Organism ID, Bacteria PSEUDOMONAS AERUGINOSA (A)  Final      Susceptibility   Pseudomonas aeruginosa - MIC*    CEFTAZIDIME 4 SENSITIVE Sensitive     CIPROFLOXACIN 0.5 SENSITIVE Sensitive     GENTAMICIN 4 SENSITIVE Sensitive     IMIPENEM 2 SENSITIVE  Sensitive     * >=100,000 COLONIES/mL PSEUDOMONAS AERUGINOSA  Resp Panel by RT-PCR (Flu A&B, Covid) Nasopharyngeal Swab     Status: None   Collection Time: 08/12/21  4:25 PM   Specimen: Nasopharyngeal Swab; Nasopharyngeal(NP) swabs in vial transport medium  Result Value Ref Range Status   SARS Coronavirus 2 by RT PCR NEGATIVE NEGATIVE Final    Comment: (NOTE) SARS-CoV-2 target nucleic acids are NOT DETECTED.  The SARS-CoV-2 RNA is generally detectable in upper respiratory specimens during the acute phase of infection. The lowest concentration of SARS-CoV-2 viral copies this assay can detect is 138 copies/mL. A negative result does not preclude SARS-Cov-2 infection and should not be used as the sole basis for treatment or other patient management decisions. A negative result may occur with  improper specimen collection/handling, submission of specimen other than nasopharyngeal swab, presence of viral mutation(s) within the areas targeted by this assay, and inadequate number of viral copies(<138 copies/mL). A negative result must be combined with clinical observations, patient history, and epidemiological information. The expected result is Negative.  Fact Sheet for Patients:  EntrepreneurPulse.com.au  Fact Sheet for Healthcare Providers:  IncredibleEmployment.be  This test is no t yet approved or cleared by the [place] FDA and  has been authorized for detection and/or diagnosis of SARS-CoV-2 by FDA under an Emergency Use Authorization (EUA). This EUA will remain  in effect (meaning this test can be used) for the duration of the COVID-19 declaration under Section 564(b)(1) of the Act, 21 U.S.C.section 360bbb-3(b)(1), unless the authorization is terminated  or revoked sooner.       Influenza A by PCR NEGATIVE NEGATIVE Final   Influenza B by PCR NEGATIVE NEGATIVE Final    Comment: (NOTE) The Xpert Xpress SARS-CoV-2/FLU/RSV plus assay is  intended as an aid in the diagnosis of influenza from Nasopharyngeal swab specimens and should not be used as a sole basis for treatment. Nasal washings and aspirates are unacceptable for Xpert Xpress SARS-CoV-2/FLU/RSV testing.  Fact Sheet for Patients: EntrepreneurPulse.com.au  Fact Sheet for Healthcare Providers: IncredibleEmployment.be  This test is not yet approved or cleared by the [place] FDA and has been  authorized for detection and/or diagnosis of SARS-CoV-2 by FDA under an Emergency Use Authorization (EUA). This EUA will remain in effect (meaning this test can be used) for the duration of the COVID-19 declaration under Section 564(b)(1) of the Act, 21 U.S.C. section 360bbb-3(b)(1), unless the authorization is terminated or revoked.  Performed at [facility], [place] [address], [place], [place] [address]   MRSA Next Gen by PCR, Nasal     Status: None   Collection Time: 08/13/21  6:36 AM   Specimen: Nasal Mucosa; Nasal Swab  Result Value Ref Range Status   MRSA by PCR Next Gen NOT DETECTED NOT DETECTED Final    Comment: (NOTE) The GeneXpert MRSA Assay (FDA approved for NASAL specimens only), is one component of a comprehensive MRSA colonization surveillance program. It is not intended to diagnose MRSA infection nor to guide or monitor treatment for MRSA infections. Test performance is not FDA approved in patients less than 95 years old. Performed at [facility], [place] [address] [place], [place] [address]      Serology:    Imaging: If present, new imagings (plain films, ct scans, and mri) have been personally visualized and interpreted; radiology reports have been reviewed. Decision making incorporated into the Impression / Recommendations.  5/23 abd pelv ct 1. There is inflammatory stranding along the cecum and ascending colon the etiology of which is difficult to determine given  the relative paucity of abdominal fat and lack of intravenous contrast material. There is no significant right-sided perinephric stranding and the appendix appears normal. However, there are right-sided colonic diverticula, as such these findings are most compatible with acute uncomplicated diverticulitis. 2. Left percutaneous nephrostomy tube with pigtails coiled in the renal pelvis and urinary bladder without hydronephrosis. 3. Left nephroureterostomy tube with pigtails coiled in the renal pelvis and urinary bladder without hydronephrosis. 4. Bilateral nonobstructive renal stones. 5. Small bilateral inguinal hernias both containing fat and a short segment of nonobstructed bowel. 6.  Aortic Atherosclerosis  5/23 cxr Clear no acute cardiopulm process   4/26 abd pelv ct by ir Since the prior CT there has been accidental displacement of the right psoas drain. Significant improvement of the right psoas abscess/hematoma, with small 2.5 cm residual and no significant inflammation.   Left-sided percutaneous nephrostomy in good position.   Bilateral nephrolithiasis in the collecting system demonstrated. No evidence of retained stones along the length of the left ureter, with interval passage of the previous left ureterovesical junction stone.   Bilateral bladder base diverticula, with retained stones on the left as before.   Large colonic stool burden suggesting constipation.   Bilateral inguinal hernia, on the right containing the sidewall of colon without obstruction, and on the left containing short segment of small bowel loop without obstruction.  Jabier Mutton, [place] [facility] [place] [phone] pager    08/14/2021, 11:58 AM

## 2021-08-15 DIAGNOSIS — A4152 Sepsis due to Pseudomonas: Secondary | ICD-10-CM | POA: Diagnosis not present

## 2021-08-15 DIAGNOSIS — N179 Acute kidney failure, unspecified: Secondary | ICD-10-CM | POA: Diagnosis not present

## 2021-08-15 DIAGNOSIS — R6520 Severe sepsis without septic shock: Secondary | ICD-10-CM | POA: Diagnosis not present

## 2021-08-15 LAB — CBC
HCT: 30.8 % — ABNORMAL LOW (ref 39.0–52.0)
Hemoglobin: 9.6 g/dL — ABNORMAL LOW (ref 13.0–17.0)
MCH: 26.2 pg (ref 26.0–34.0)
MCHC: 31.2 g/dL (ref 30.0–36.0)
MCV: 84.2 fL (ref 80.0–100.0)
Platelets: 226 10*3/uL (ref 150–400)
RBC: 3.66 MIL/uL — ABNORMAL LOW (ref 4.22–5.81)
RDW: 15.8 % — ABNORMAL HIGH (ref 11.5–15.5)
WBC: 17.1 10*3/uL — ABNORMAL HIGH (ref 4.0–10.5)
nRBC: 0.2 % (ref 0.0–0.2)

## 2021-08-15 LAB — BASIC METABOLIC PANEL
Anion gap: 6 (ref 5–15)
BUN: 21 mg/dL (ref 8–23)
CO2: 19 mmol/L — ABNORMAL LOW (ref 22–32)
Calcium: 8 mg/dL — ABNORMAL LOW (ref 8.9–10.3)
Chloride: 114 mmol/L — ABNORMAL HIGH (ref 98–111)
Creatinine, Ser: 1.16 mg/dL (ref 0.61–1.24)
GFR, Estimated: >60 mL/min (ref >60)
Glucose, Bld: 101 mg/dL — ABNORMAL HIGH (ref 70–99)
Potassium: 3.6 mmol/L (ref 3.5–5.1)
Sodium: 139 mmol/L (ref 135–145)

## 2021-08-15 LAB — CULTURE, BLOOD (ROUTINE X 2)
Special Requests: ADEQUATE
Special Requests: ADEQUATE

## 2021-08-15 LAB — MAGNESIUM: Magnesium: 2 mg/dL (ref 1.7–2.4)

## 2021-08-15 MED ORDER — AMLODIPINE BESYLATE 5 MG PO TABS
5.0000 mg | ORAL_TABLET | Freq: Every day | ORAL | Status: DC
  Administered 2021-08-15 – 2021-08-20 (×6): 5 mg via ORAL
  Filled 2021-08-15 (×6): qty 1

## 2021-08-15 MED ORDER — FUROSEMIDE 10 MG/ML IJ SOLN
20.0000 mg | Freq: Once | INTRAMUSCULAR | Status: AC
Start: 2021-08-15 — End: 2021-08-15
  Administered 2021-08-15: 20 mg via INTRAVENOUS
  Filled 2021-08-15: qty 2

## 2021-08-15 MED ORDER — HYDRALAZINE HCL 20 MG/ML IJ SOLN
10.0000 mg | Freq: Four times a day (QID) | INTRAMUSCULAR | Status: DC | PRN
  Administered 2021-08-15: 10 mg via INTRAVENOUS
  Filled 2021-08-15: qty 1

## 2021-08-15 MED ORDER — SODIUM CHLORIDE 0.9 % IV SOLN
1.0000 g | Freq: Three times a day (TID) | INTRAVENOUS | Status: DC
  Administered 2021-08-15 – 2021-08-20 (×16): 1 g via INTRAVENOUS
  Filled 2021-08-15 (×4): qty 20
  Filled 2021-08-15: qty 1
  Filled 2021-08-15 (×6): qty 20
  Filled 2021-08-15 (×2): qty 1
  Filled 2021-08-15: qty 20
  Filled 2021-08-15: qty 1
  Filled 2021-08-15 (×4): qty 20

## 2021-08-15 MED ORDER — IPRATROPIUM-ALBUTEROL 0.5-2.5 (3) MG/3ML IN SOLN
3.0000 mL | Freq: Four times a day (QID) | RESPIRATORY_TRACT | Status: DC | PRN
  Administered 2021-08-15: 3 mL via RESPIRATORY_TRACT
  Filled 2021-08-15: qty 3

## 2021-08-15 NOTE — Progress Notes (Signed)
  Subjective: Denies pain. No nausea or emesis. Afebrile.  Objective: Vital signs in last 24 hours: Temp:  [98.2 F (36.8 C)-99.8 F (37.7 C)] 99.8 F (37.7 C) (05/26 0528) Pulse Rate:  [82-90] 83 (05/26 0528) Resp:  [14-20] 20 (05/26 0536) BP: (148-160)/(80-90) 152/88 (05/26 0536) SpO2:  [96 %-97 %] 97 % (05/26 0820)  Intake/Output from previous day: 05/25 0701 - 05/26 0700 In: 440 [P.O.:240; IV Piggyback:200] Out: 2150 [Urine:2150] Intake/Output this shift: No intake/output data recorded.  UOP: 2.1L clear yellow  Physical Exam:  General: Alert and oriented CV: RRR Lungs: Clear Abdomen: Soft, ND, NT Ext: NT, No erythema  Lab Results: Recent Labs    08/13/21 0105 08/14/21 0449 08/15/21 0524  HGB 10.2* 9.7* 9.6*  HCT 31.8* 30.8* 30.8*   BMET Recent Labs    08/14/21 0449 08/15/21 0524  NA 140 139  K 3.6 3.6  CL 116* 114*  CO2 21* 19*  GLUCOSE 93 101*  BUN 22 21  CREATININE 1.43* 1.16  CALCIUM 8.0* 8.0*     Studies/Results: DG CHEST PORT 1 VIEW  Result Date: 08/14/2021 CLINICAL DATA:  COPD EXAM: PORTABLE CHEST 1 VIEW COMPARISON:  Chest x-ray dated June 25, 2021 FINDINGS: Cardiac and mediastinal contours within normal limits. Focal consolidation. New mild hazy right basilar opacity, possibly due to atelectasis or layering pleural effusion. No evidence of pneumothorax. IMPRESSION: New mild hazy right basilar opacity, possibly due to atelectasis or small layering pleural effusion. Electronically Signed   By: Yetta Glassman M.D.   On: 08/14/2021 13:54    Assessment/Plan: 1.         Sepsis due to UTI after right ureteroscopy with laser lithotripsy for right sided stones  -He has been afebrile with declining creatinine with L PCN capped. -Removed L PCN today -Also removed right ureteral stone. -Replaced foley catheter and he will keep this until followup with me given incomplete bladder emptying. -Continue IV abx -Appreciate ID evaluation -As long as  does not become febrile today, ok to discharge home with course of abx per ID rec    LOS: 3 days   Matt R. Audreena Sachdeva MD 08/15/2021, 8:35 AM [facility]  Pager: [phone]

## 2021-08-15 NOTE — TOC Progression Note (Signed)
Transition of Care [facility]) - Progression Note    Patient Details  Name: Ian Boyd MRN: [id number] Date of Birth: Jun 30, 1950  [facility] CM/SW Contact  Camrin Gearheart, Juliann Pulse, RN Phone Number: 08/15/2021, 11:56 AM  Clinical Narrative: [facility] rep Janie-unable to accept over weekend-they will assess bed availability on Monday if stable for d/c then.      Expected Discharge Plan: Long Term Nursing Home Barriers to Discharge: Continued Medical Work up  Expected Discharge Plan and Services Expected Discharge Plan: [place]   Discharge Planning Services: CM Consult   Living arrangements for the past 2 months: [place]                                       Social Determinants of Health (SDOH) Interventions    Readmission Risk Interventions     View : No data to display.

## 2021-08-15 NOTE — Care Management Important Message (Signed)
Important Message  Patient Details IM Letter given to the Patient. Name: ALANSON HAUSMANN MRN: [id number] Date of Birth: 07-Jan-1951   Medicare Important Message Given:  Yes     Kerin Salen 08/15/2021, 12:23 PM

## 2021-08-15 NOTE — Progress Notes (Addendum)
[facility]  Date of Admission:  08/12/2021     Lines/catheter: Left nephrostomy tube -- removed 5/26 Indwelling foley Bilateral ureteral stent   Abx: 5/26-c meropenem  5/24-26 cefepime 5/23 vanc/ceftriaxone                                                         Assessment: 71 yo male copd, hep c, admitted 4/5-14 previously with ecoli bacteremia/uti complicated by psoas abscess right side in setting of obstructive hydronephrosis from stone (left vesicoureteral junction s/p stent and left perc nephrostomy tube) -- had right psoas drain placed and since removed; discharged to snf on cefadroxil after initial iv abx planned until 5/25 -- readmitted 5/23 due to recurrent sepsis with pseudomonas bacteremia/uti after recent right ureteroscopy and laser lithotripsy of right sided stone on 5/22   Repeat ct 5/23 showed nonobstructive bilateral renal stones. Urology evaluated and continue to have the nephrostomy tube capped (since 5/23), as he is draining well from the previously placed foley catheter. He has right ureteral stent placed 5/22 and previously placed left ureteral stent as well. The ct scan didn't mention previous right psoas abscess presence any longer   5/23 bcx pseudomonas aeruginosa pending susceptibility 5/23 ucx PsA (S ceftaz, imipenem, cipro; cefepime mic 8; I pip-tazo)   He is clinically better with cefepime   Of note, chronic hep c. Can f/u outpatient near future for discussion of treatment   -------------- 5/26 assessment The blood cx from 5/23 with pseudomonas showed it resistant to cefepime despite clinical improvement; it is sensitive to cipro Changed abx to meropenem 5/26 -- will keep iv in patient and changed to ciprofloxacin on discharge  Plan 14 days of abx treatment in setting of stent/stone present  Will need urology removal of stent within the next few weeks     Plan: Changed abx to meropenem On discharge can  transition meropenem to PO ciprofloxacin and finish 14 day course starting 5/26 He'll likely need to have urology evaluation for further stone removal and stent/nephrostomy tube removal as indicated per urology GU Perioperative abx update -- would recommend ciprofloxacin use periop given the updated pseudomonas susceptibility profile Will sign off Discussed with primary team    7.   He has ID f/u with me on 7/6 @ 330 for chronic hep c evaluation/treatment    I spent more than 35 minute reviewing data/chart, and coordinating care and >50% direct face to face time providing counseling/discussing diagnostics/treatment plan with patient     Principal Problem:   Severe sepsis without septic shock ([place]) Active Problems:   Hepatitis C   Psoas abscess, right ([place])   Hydronephrosis of left kidney   Urinary tract infection   Nephrolithiasis   AKI (acute kidney injury) ([place])   Bacteremia due to Pseudomonas   No Known Allergies  Scheduled Meds:  amLODipine  5 mg Oral Daily   Chlorhexidine Gluconate Cloth  6 each Topical Daily   dorzolamide-timolol  1 drop Left Eye BID   feeding supplement  237 mL Oral BID BM   heparin  5,000 Units Subcutaneous Q8H   latanoprost  1 drop Left Eye QHS   multivitamin with minerals  1 tablet Oral Daily   Netarsudil Dimesylate  1 drop Left Eye QHS  tamsulosin  0.4 mg Oral QHS   umeclidinium bromide  1 puff Inhalation Daily   Continuous Infusions:  meropenem (MERREM) IV Stopped (08/15/21 0920)   PRN Meds:.acetaminophen **OR** acetaminophen, docusate sodium, guaiFENesin-dextromethorphan, ipratropium-albuterol, lip balm   SUBJECTIVE: Continues to feel better Nephrostomy tube removed 5/26 Afebrile  Bcx PsA resistant to cefepime   No n/v/diarrhea   Review of Systems: ROS All other ROS was negative, except mentioned above     OBJECTIVE: Vitals:   08/14/21 2127 08/15/21 0528 08/15/21 0536 08/15/21 0820  BP: (!) 149/80 (!) 160/90 (!)  152/88   Pulse: 90 83    Resp: '14 18 20   '$ Temp: 98.2 F (36.8 C) 99.8 F (37.7 C)    TempSrc: Oral Oral    SpO2: 97% 97%  97%  Weight:      Height:       Body mass index is 19.02 kg/m.  Physical Exam  General/constitutional: no distress, pleasant, conversant, chronically ill appearing though HEENT: Normocephalic, PER, Conj Clear, EOMI, Oropharynx clear Neck supple CV: rrr no mrg Lungs: clear to auscultation, normal respiratory effort Abd: Soft, Nontender Ext: no edema Skin: No Rash Neuro: nonfocal -- generalized weakness  Lab Results Lab Results  Component Value Date   WBC 17.1 (H) 08/15/2021   HGB 9.6 (L) 08/15/2021   HCT 30.8 (L) 08/15/2021   MCV 84.2 08/15/2021   PLT 226 08/15/2021    Lab Results  Component Value Date   CREATININE 1.16 08/15/2021   BUN 21 08/15/2021   NA 139 08/15/2021   K 3.6 08/15/2021   CL 114 (H) 08/15/2021   CO2 19 (L) 08/15/2021    Lab Results  Component Value Date   ALT 23 08/14/2021   AST 24 08/14/2021   ALKPHOS 60 08/14/2021   BILITOT 0.6 08/14/2021      Microbiology: Recent Results (from the past 240 hour(s))  Blood culture (routine x 2)     Status: Abnormal   Collection Time: 08/12/21  3:52 PM   Specimen: BLOOD  Result Value Ref Range Status   Specimen Description BLOOD LEFT ANTECUBITAL  Final   Special Requests   Final    BOTTLES DRAWN AEROBIC AND ANAEROBIC Blood Culture adequate volume   Culture  Setup Time   Final    GRAM NEGATIVE RODS IN BOTH AEROBIC AND ANAEROBIC BOTTLES CRITICAL RESULT CALLED TO, READ BACK BY AND VERIFIED WITH: PHARMD A ELLINGTON [id number] FCP    Culture PSEUDOMONAS AERUGINOSA (A)  Final   Report Status 08/15/2021 FINAL  Final   Organism ID, Bacteria PSEUDOMONAS AERUGINOSA  Final      Susceptibility   Pseudomonas aeruginosa - MIC*    CEFTAZIDIME 8 SENSITIVE Sensitive     CIPROFLOXACIN <=0.25 SENSITIVE Sensitive     GENTAMICIN 2 SENSITIVE Sensitive     IMIPENEM 2 SENSITIVE Sensitive      CEFEPIME Value in next row Resistant      RESISTANT16Performed at [facility], [address] [place], [place] [address]    * PSEUDOMONAS AERUGINOSA  Blood culture (routine x 2)     Status: Abnormal   Collection Time: 08/12/21  3:52 PM   Specimen: BLOOD  Result Value Ref Range Status   Specimen Description   Final    BLOOD LEFT ANTECUBITAL Performed at [facility] [address], [place], [place] [address]    Special Requests   Final    BOTTLES DRAWN AEROBIC AND ANAEROBIC Blood Culture adequate volume Performed  at [facility], [place] [address], [place], [place] [address]    Culture  Setup Time   Final    GRAM NEGATIVE RODS AEROBIC BOTTLE ONLY CRITICAL VALUE NOTED.  VALUE IS CONSISTENT WITH PREVIOUSLY REPORTED AND CALLED VALUE.    Culture (A)  Final    PSEUDOMONAS AERUGINOSA SUSCEPTIBILITIES PERFORMED ON PREVIOUS CULTURE WITHIN THE LAST 5 DAYS. Performed at [facility], [place] [address], [place], [place] [address]    Report Status 08/15/2021 FINAL  Final  Blood Culture ID Panel (Reflexed)     Status: Abnormal   Collection Time: 08/12/21  3:52 PM  Result Value Ref Range Status   Enterococcus faecalis NOT DETECTED NOT DETECTED Final   Enterococcus Faecium NOT DETECTED NOT DETECTED Final   Listeria monocytogenes NOT DETECTED NOT DETECTED Final   Staphylococcus species NOT DETECTED NOT DETECTED Final   Staphylococcus aureus (BCID) NOT DETECTED NOT DETECTED Final   Staphylococcus epidermidis NOT DETECTED NOT DETECTED Final   Staphylococcus lugdunensis NOT DETECTED NOT DETECTED Final   Streptococcus species NOT DETECTED NOT DETECTED Final   Streptococcus agalactiae NOT DETECTED NOT DETECTED Final   Streptococcus pneumoniae NOT DETECTED NOT DETECTED Final   Streptococcus pyogenes NOT DETECTED NOT DETECTED Final   A.calcoaceticus-baumannii NOT DETECTED NOT DETECTED Final   Bacteroides fragilis NOT DETECTED NOT DETECTED Final    Enterobacterales NOT DETECTED NOT DETECTED Final   Enterobacter cloacae complex NOT DETECTED NOT DETECTED Final   Escherichia coli NOT DETECTED NOT DETECTED Final   Klebsiella aerogenes NOT DETECTED NOT DETECTED Final   Klebsiella oxytoca NOT DETECTED NOT DETECTED Final   Klebsiella pneumoniae NOT DETECTED NOT DETECTED Final   Proteus species NOT DETECTED NOT DETECTED Final   Salmonella species NOT DETECTED NOT DETECTED Final   Serratia marcescens NOT DETECTED NOT DETECTED Final   Haemophilus influenzae NOT DETECTED NOT DETECTED Final   Neisseria meningitidis NOT DETECTED NOT DETECTED Final   Pseudomonas aeruginosa DETECTED (A) NOT DETECTED Final    Comment: CRITICAL RESULT CALLED TO, READ BACK BY AND VERIFIED WITH: PHARMD A ELLINGTON 1406 [id number] FCP    Stenotrophomonas maltophilia NOT DETECTED NOT DETECTED Final   Candida albicans NOT DETECTED NOT DETECTED Final   Candida auris NOT DETECTED NOT DETECTED Final   Candida glabrata NOT DETECTED NOT DETECTED Final   Candida krusei NOT DETECTED NOT DETECTED Final   Candida parapsilosis NOT DETECTED NOT DETECTED Final   Candida tropicalis NOT DETECTED NOT DETECTED Final   Cryptococcus neoformans/gattii NOT DETECTED NOT DETECTED Final   CTX-M ESBL NOT DETECTED NOT DETECTED Final   Carbapenem resistance IMP NOT DETECTED NOT DETECTED Final   Carbapenem resistance KPC NOT DETECTED NOT DETECTED Final   Carbapenem resistance NDM NOT DETECTED NOT DETECTED Final   Carbapenem resistance VIM NOT DETECTED NOT DETECTED Final    Comment: Performed at [facility], [address] [place], [place] [address]  Urine Culture     Status: Abnormal   Collection Time: 08/12/21  4:25 PM   Specimen: Urine, Clean Catch  Result Value Ref Range Status   Specimen Description URINE, CLEAN CATCH  Final   Special Requests NONE  Final   Culture >=100,000 COLONIES/mL PSEUDOMONAS AERUGINOSA (A)  Final   Report Status 08/14/2021 FINAL  Final   Organism ID,  Bacteria PSEUDOMONAS AERUGINOSA (A)  Final      Susceptibility   Pseudomonas aeruginosa - MIC*    CEFTAZIDIME 4 SENSITIVE Sensitive     CIPROFLOXACIN 0.5 SENSITIVE Sensitive  GENTAMICIN 4 SENSITIVE Sensitive     IMIPENEM 2 SENSITIVE Sensitive     CEFEPIME Value in next row Sensitive      SENSITIVE8Performed at [facility] [address] [place], [place] [address]    * >=100,000 COLONIES/mL PSEUDOMONAS AERUGINOSA  Resp Panel by RT-PCR (Flu A&B, Covid) Nasopharyngeal Swab     Status: None   Collection Time: 08/12/21  4:25 PM   Specimen: Nasopharyngeal Swab; Nasopharyngeal(NP) swabs in vial transport medium  Result Value Ref Range Status   SARS Coronavirus 2 by RT PCR NEGATIVE NEGATIVE Final    Comment: (NOTE) SARS-CoV-2 target nucleic acids are NOT DETECTED.  The SARS-CoV-2 RNA is generally detectable in upper respiratory specimens during the acute phase of infection. The lowest concentration of SARS-CoV-2 viral copies this assay can detect is 138 copies/mL. A negative result does not preclude SARS-Cov-2 infection and should not be used as the sole basis for treatment or other patient management decisions. A negative result may occur with  improper specimen collection/handling, submission of specimen other than nasopharyngeal swab, presence of viral mutation(s) within the areas targeted by this assay, and inadequate number of viral copies(<138 copies/mL). A negative result must be combined with clinical observations, patient history, and epidemiological information. The expected result is Negative.  Fact Sheet for Patients:  EntrepreneurPulse.com.au  Fact Sheet for Healthcare Providers:  IncredibleEmployment.be  This test is no t yet approved or cleared by the [place] FDA and  has been authorized for detection and/or diagnosis of SARS-CoV-2 by FDA under an Emergency Use Authorization (EUA). This EUA will remain  in effect  (meaning this test can be used) for the duration of the COVID-19 declaration under Section 564(b)(1) of the Act, 21 U.S.C.section 360bbb-3(b)(1), unless the authorization is terminated  or revoked sooner.       Influenza A by PCR NEGATIVE NEGATIVE Final   Influenza B by PCR NEGATIVE NEGATIVE Final    Comment: (NOTE) The Xpert Xpress SARS-CoV-2/FLU/RSV plus assay is intended as an aid in the diagnosis of influenza from Nasopharyngeal swab specimens and should not be used as a sole basis for treatment. Nasal washings and aspirates are unacceptable for Xpert Xpress SARS-CoV-2/FLU/RSV testing.  Fact Sheet for Patients: EntrepreneurPulse.com.au  Fact Sheet for Healthcare Providers: IncredibleEmployment.be  This test is not yet approved or cleared by the [place] FDA and has been authorized for detection and/or diagnosis of SARS-CoV-2 by FDA under an Emergency Use Authorization (EUA). This EUA will remain in effect (meaning this test can be used) for the duration of the COVID-19 declaration under Section 564(b)(1) of the Act, 21 U.S.C. section 360bbb-3(b)(1), unless the authorization is terminated or revoked.  Performed at [facility], [place] [address], [place], [place] [address]   MRSA Next Gen by PCR, Nasal     Status: None   Collection Time: 08/13/21  6:36 AM   Specimen: Nasal Mucosa; Nasal Swab  Result Value Ref Range Status   MRSA by PCR Next Gen NOT DETECTED NOT DETECTED Final    Comment: (NOTE) The GeneXpert MRSA Assay (FDA approved for NASAL specimens only), is one component of a comprehensive MRSA colonization surveillance program. It is not intended to diagnose MRSA infection nor to guide or monitor treatment for MRSA infections. Test performance is not FDA approved in patients less than 6 years old. Performed at [facility], [place] [address] [place], [place] [address]       Serology:   Imaging: If present,  new imagings (plain films, ct scans, and mri) have been personally visualized and interpreted; radiology reports have been reviewed. Decision making incorporated into the Impression / Recommendations.  5/23 abd pelv ct 1. There is inflammatory stranding along the cecum and ascending colon the etiology of which is difficult to determine given the relative paucity of abdominal fat and lack of intravenous contrast material. There is no significant right-sided perinephric stranding and the appendix appears normal. However, there are right-sided colonic diverticula, as such these findings are most compatible with acute uncomplicated diverticulitis. 2. Left percutaneous nephrostomy tube with pigtails coiled in the renal pelvis and urinary bladder without hydronephrosis. 3. Left nephroureterostomy tube with pigtails coiled in the renal pelvis and urinary bladder without hydronephrosis. 4. Bilateral nonobstructive renal stones. 5. Small bilateral inguinal hernias both containing fat and a short segment of nonobstructed bowel. 6.  Aortic Atherosclerosis   5/23 cxr Clear no acute cardiopulm process     4/26 abd pelv ct by ir Since the prior CT there has been accidental displacement of the right psoas drain. Significant improvement of the right psoas abscess/hematoma, with small 2.5 cm residual and no significant inflammation.   Left-sided percutaneous nephrostomy in good position.   Bilateral nephrolithiasis in the collecting system demonstrated. No evidence of retained stones along the length of the left ureter, with interval passage of the previous left ureterovesical junction stone.   Bilateral bladder base diverticula, with retained stones on the left as before.   Large colonic stool burden suggesting constipation.   Bilateral inguinal hernia, on the right containing the sidewall of colon without obstruction, and on the left containing  short segment of small bowel loop without obstruction.   Jabier Mutton, [facility] [place] [phone] pager    08/15/2021, 11:36 AM

## 2021-08-15 NOTE — Progress Notes (Addendum)
PROGRESS NOTE  Ian Boyd GYI:[id number] DOB: Feb 21, 1951 DOA: 08/12/2021 PCP: Curlene Labrum, MD   LOS: 3 days   Brief Narrative / Interim history: 71 y.o. male with medical history significant of hypertension, recent psoas abscess, COPD, nephrolithiasis, comes into the hospital from the [facility]F due to fever and tachycardia.  He was recently hospitalized in April 2023 with E. 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  He had a drain placed at that time, and ID followed patient while hospitalized.  Eventually improved, follow-up with ID as an outpatient about 3 weeks ago, and was supposed to stay on cefadroxil until 5/25.  He underwent treatment for his nephrolithiasis 5/22 with cystoscopy, right ureteroscopy, stone extraction and right ureteral stent placement.  24 hours afterwards developed a fever, tachycardia, and was brought to the hospital.  He was found to have Pseudomonas bacteremia as well as Pseudomonas UTI  Subjective / 24h Interval events: States that he is feeling a little bit of wheezing in his throat, complains of some shortness of breath.  Assesement and Plan: Principal Problem:   Severe sepsis without septic shock (HCC) Active Problems:   Psoas abscess, right (HCC)   Hydronephrosis of left kidney   AKI (acute kidney injury) ([place])   Urinary tract infection   Nephrolithiasis   Hepatitis C   Bacteremia due to Pseudomonas  Principal problem Sepsis due to Pseudomonas UTI and Pseudomonas bacteremia-patient presents 24 hours after instrumentation with elevated WBC, fever, tachycardia and pyuria.  Blood cultures and urine cultures were sent, speciated Pseudomonas.  ID consulted.  Continue cefepime, when clinically improving white count gets better could potentially be transitioned to Cipro for total of 14 days. Dose on dc will be 500 mg po q8h or 750 mg po q12h per pharmacy, due to pseudomonas bacteremia -CT scan  of the abdomen and pelvis raised suspicion for diverticulitis however he has no abdominal pain and his exam is unremarkable.    Active problems Nephrolithiasis/bladder stone, left kidney hydronephrosis-patient just underwent cystoscopy with the prior to admission with laser lithotripsy on the right and extraction of stones, he also had a right ureteral stent placement.  Also underwent a cystoscopy which showed bladder diverticuli.  He also has a left percutaneous nephrostomy tube.  Urology consulted, L PCN tube removed today, Foley catheter replaced today also.   Essential hypertension-he is becoming more hypertensive, resume home amlodipine   Acute kidney injury-due to sepsis, creatinine improving with fluids, continue fluids   Urinary retention-continue home medications   COPD-stable, no wheezing, continue home inhalers   Recent psoas abscess, recent E. coli bacteremia-was already on antibiotics as an outpatient for 2 additional days with plan to end on 5/25.  Currently on cefepime   Right rotator cuff tear-chronic, has been going on for the past 3 years  History of hep C-ID will follow as an outpatient  Scheduled Meds:  Chlorhexidine Gluconate Cloth  6 each Topical Daily   dorzolamide-timolol  1 drop Left Eye BID   feeding supplement  237 mL Oral BID BM   heparin  5,000 Units Subcutaneous Q8H   latanoprost  1 drop Left Eye QHS   multivitamin with minerals  1 tablet Oral Daily   Netarsudil Dimesylate  1 drop Left Eye QHS   tamsulosin  0.4 mg Oral QHS   umeclidinium bromide  1 puff Inhalation Daily   Continuous Infusions:  meropenem (MERREM) IV 1 g (08/15/21 0849)  PRN Meds:.acetaminophen **OR** acetaminophen, docusate sodium, guaiFENesin-dextromethorphan, ipratropium-albuterol, lip balm  Diet Orders (From admission, onward)     Start     Ordered   08/12/21 2115  Diet Heart Room service appropriate? Yes; Fluid consistency: Thin  Diet effective now       Question Answer  Comment  Room service appropriate? Yes   Fluid consistency: Thin      08/12/21 2115            DVT prophylaxis: heparin injection 5,000 Units Start: 08/12/21 2200   Lab Results  Component Value Date   PLT 226 08/15/2021      Code Status: Full Code  Family Communication: no family at bedside   Status is: Inpatient  Remains inpatient appropriate because: IV antibiotics, monitor cultures, leukocytosis  Level of care: Progressive  Consultants:  Urology   Procedures:  none  Microbiology  Urine cultures - pseudomonas  Objective: Vitals:   08/14/21 2127 08/15/21 0528 08/15/21 0536 08/15/21 0820  BP: (!) 149/80 (!) 160/90 (!) 152/88   Pulse: 90 83    Resp: '14 18 20   '$ Temp: 98.2 F (36.8 C) 99.8 F (37.7 C)    TempSrc: Oral Oral    SpO2: 97% 97%  97%  Weight:      Height:        Intake/Output Summary (Last 24 hours) at 08/15/2021 1119 Last data filed at 08/15/2021 0544 Gross per 24 hour  Intake 440 ml  Output 2150 ml  Net -1710 ml    Wt Readings from Last 3 Encounters:  08/12/21 63.6 kg  08/11/21 57.6 kg  06/29/21 57.3 kg    Examination:  Constitutional: NAD Eyes: lids and conjunctivae normal, no scleral icterus ENMT: mmm Neck: normal, supple Respiratory: Faint bibasilar crackles, normal respiratory effort Cardiovascular: Regular rate and rhythm, no murmurs / rubs / gallops. No LE edema. Abdomen: soft, no distention, no tenderness. Bowel sounds positive.  Skin: no rashes Neurologic: no focal deficits, equal strength   Data Reviewed: I have independently reviewed following labs and imaging studies  CBC Recent Labs  Lab 08/11/21 1158 08/12/21 1552 08/13/21 0105 08/14/21 0449 08/15/21 0524  WBC 9.0 26.7* 33.2* 23.2* 17.1*  HGB 12.3* 11.0* 10.2* 9.7* 9.6*  HCT 41.2 34.5* 31.8* 30.8* 30.8*  PLT 380 320 302 217 226  MCV 90.7 85.2 82.6 84.6 84.2  MCH 27.1 27.2 26.5 26.6 26.2  MCHC 29.9* 31.9 32.1 31.5 31.2  RDW 15.7* 15.9* 16.1* 16.0*  15.8*  LYMPHSABS  --  1.0  --   --   --   MONOABS  --  2.0*  --   --   --   EOSABS  --  0.0  --   --   --   BASOSABS  --  0.0  --   --   --      Recent Labs  Lab 08/11/21 1158 08/12/21 1552 08/12/21 1552 08/12/21 1601 08/12/21 1838 08/12/21 2232 08/13/21 0105 08/13/21 0436 08/13/21 0658 08/14/21 0449 08/15/21 0524  NA 139 137  --   --   --   --  138  --   --  140 139  K 4.1 4.1  --   --   --   --  4.1  --   --  3.6 3.6  CL 107 105  --   --   --   --  108  --   --  116* 114*  CO2 22 22  --   --   --   --  22  --   --  21* 19*  GLUCOSE 93 119*  --   --   --   --  99  --   --  93 101*  BUN 12 20  --   --   --   --  21  --   --  22 21  CREATININE 1.15 1.79*  --   --   --   --  1.75*  --   --  1.43* 1.16  CALCIUM 9.2 8.4*  --   --   --   --  8.2*  --   --  8.0* 8.0*  AST 50* 54*  --   --   --   --  51*  --   --  24  --   ALT 41 39  --   --   --   --  38  --   --  23  --   ALKPHOS 102 83  --   --   --   --  70  --   --  60  --   BILITOT 0.6 0.7  --   --   --   --  1.0  --   --  0.6  --   ALBUMIN 3.3* 2.8*  --   --   --   --  2.5*  --   --  2.2*  --   MG  --   --   --   --   --   --   --  1.3*  --  2.1 2.0  LATICACIDVEN  --  4.3*   < >  --  3.4* 4.0* 2.6* 2.0* 2.3*  --   --   INR  --   --   --  1.2  --   --   --   --   --   --   --    < > = values in this interval not displayed.     ------------------------------------------------------------------------------------------------------------------ No results for input(s): CHOL, HDL, LDLCALC, TRIG, CHOLHDL, LDLDIRECT in the last 72 hours.  No results found for: HGBA1C ------------------------------------------------------------------------------------------------------------------ No results for input(s): TSH, T4TOTAL, T3FREE, THYROIDAB in the last 72 hours.  Invalid input(s): FREET3  Cardiac Enzymes No results for input(s): CKMB, TROPONINI, MYOGLOBIN in the last 168 hours.  Invalid input(s):  CK ------------------------------------------------------------------------------------------------------------------ No results found for: BNP  CBG: No results for input(s): GLUCAP in the last 168 hours.  Recent Results (from the past 240 hour(s))  Blood culture (routine x 2)     Status: Abnormal   Collection Time: 08/12/21  3:52 PM   Specimen: BLOOD  Result Value Ref Range Status   Specimen Description BLOOD LEFT ANTECUBITAL  Final   Special Requests   Final    BOTTLES DRAWN AEROBIC AND ANAEROBIC Blood Culture adequate volume   Culture  Setup Time   Final    GRAM NEGATIVE RODS IN BOTH AEROBIC AND ANAEROBIC BOTTLES CRITICAL RESULT CALLED TO, READ BACK BY AND VERIFIED WITH: PHARMD A ELLINGTON [id number] [id number] FCP    Culture PSEUDOMONAS AERUGINOSA (A)  Final   Report Status 08/15/2021 FINAL  Final   Organism ID, Bacteria PSEUDOMONAS AERUGINOSA  Final      Susceptibility   Pseudomonas aeruginosa - MIC*    CEFTAZIDIME 8 SENSITIVE Sensitive     CIPROFLOXACIN <=0.25 SENSITIVE Sensitive     GENTAMICIN 2 SENSITIVE Sensitive     IMIPENEM 2 SENSITIVE Sensitive     CEFEPIME  Value in next row Resistant      RESISTANT16Performed at [facility] [address], [place], [place] [address]    * PSEUDOMONAS AERUGINOSA  Blood culture (routine x 2)     Status: Abnormal   Collection Time: 08/12/21  3:52 PM   Specimen: BLOOD  Result Value Ref Range Status   Specimen Description   Final    BLOOD LEFT ANTECUBITAL Performed at [facility] [address] [place], [place] [address]    Special Requests   Final    BOTTLES DRAWN AEROBIC AND ANAEROBIC Blood Culture adequate volume Performed at [facility] [address], [place], [place] [address]    Culture  Setup Time   Final    GRAM NEGATIVE RODS AEROBIC BOTTLE ONLY CRITICAL VALUE NOTED.  VALUE IS CONSISTENT WITH PREVIOUSLY REPORTED AND CALLED VALUE.    Culture (A)  Final    PSEUDOMONAS  AERUGINOSA SUSCEPTIBILITIES PERFORMED ON PREVIOUS CULTURE WITHIN THE LAST 5 DAYS. Performed at [facility], [place] [address] [place], [place] [address]    Report Status 08/15/2021 FINAL  Final  Blood Culture ID Panel (Reflexed)     Status: Abnormal   Collection Time: 08/12/21  3:52 PM  Result Value Ref Range Status   Enterococcus faecalis NOT DETECTED NOT DETECTED Final   Enterococcus Faecium NOT DETECTED NOT DETECTED Final   Listeria monocytogenes NOT DETECTED NOT DETECTED Final   Staphylococcus species NOT DETECTED NOT DETECTED Final   Staphylococcus aureus (BCID) NOT DETECTED NOT DETECTED Final   Staphylococcus epidermidis NOT DETECTED NOT DETECTED Final   Staphylococcus lugdunensis NOT DETECTED NOT DETECTED Final   Streptococcus species NOT DETECTED NOT DETECTED Final   Streptococcus agalactiae NOT DETECTED NOT DETECTED Final   Streptococcus pneumoniae NOT DETECTED NOT DETECTED Final   Streptococcus pyogenes NOT DETECTED NOT DETECTED Final   A.calcoaceticus-baumannii NOT DETECTED NOT DETECTED Final   Bacteroides fragilis NOT DETECTED NOT DETECTED Final   Enterobacterales NOT DETECTED NOT DETECTED Final   Enterobacter cloacae complex NOT DETECTED NOT DETECTED Final   Escherichia coli NOT DETECTED NOT DETECTED Final   Klebsiella aerogenes NOT DETECTED NOT DETECTED Final   Klebsiella oxytoca NOT DETECTED NOT DETECTED Final   Klebsiella pneumoniae NOT DETECTED NOT DETECTED Final   Proteus species NOT DETECTED NOT DETECTED Final   Salmonella species NOT DETECTED NOT DETECTED Final   Serratia marcescens NOT DETECTED NOT DETECTED Final   Haemophilus influenzae NOT DETECTED NOT DETECTED Final   Neisseria meningitidis NOT DETECTED NOT DETECTED Final   Pseudomonas aeruginosa DETECTED (A) NOT DETECTED Final    Comment: CRITICAL RESULT CALLED TO, READ BACK BY AND VERIFIED WITH: PHARMD A ELLINGTON 1406 [id number] FCP    Stenotrophomonas maltophilia NOT DETECTED NOT DETECTED Final    Candida albicans NOT DETECTED NOT DETECTED Final   Candida auris NOT DETECTED NOT DETECTED Final   Candida glabrata NOT DETECTED NOT DETECTED Final   Candida krusei NOT DETECTED NOT DETECTED Final   Candida parapsilosis NOT DETECTED NOT DETECTED Final   Candida tropicalis NOT DETECTED NOT DETECTED Final   Cryptococcus neoformans/gattii NOT DETECTED NOT DETECTED Final   CTX-M ESBL NOT DETECTED NOT DETECTED Final   Carbapenem resistance IMP NOT DETECTED NOT DETECTED Final   Carbapenem resistance KPC NOT DETECTED NOT DETECTED Final   Carbapenem resistance NDM NOT DETECTED NOT DETECTED Final   Carbapenem resistance VIM NOT DETECTED NOT DETECTED Final    Comment: Performed at [facility], [address] [place], [place] [address]  Urine Culture     Status: Abnormal   Collection Time: 08/12/21  4:25 PM   Specimen: Urine, Clean Catch  Result Value Ref Range Status   Specimen Description URINE, CLEAN CATCH  Final   Special Requests NONE  Final   Culture >=100,000 COLONIES/mL PSEUDOMONAS AERUGINOSA (A)  Final   Report Status 08/14/2021 FINAL  Final   Organism ID, Bacteria PSEUDOMONAS AERUGINOSA (A)  Final      Susceptibility   Pseudomonas aeruginosa - MIC*    CEFTAZIDIME 4 SENSITIVE Sensitive     CIPROFLOXACIN 0.5 SENSITIVE Sensitive     GENTAMICIN 4 SENSITIVE Sensitive     IMIPENEM 2 SENSITIVE Sensitive     CEFEPIME Value in next row Sensitive      SENSITIVE8Performed at [facility] [address], [place], [place] [address]    * >=100,000 COLONIES/mL PSEUDOMONAS AERUGINOSA  Resp Panel by RT-PCR (Flu A&B, Covid) Nasopharyngeal Swab     Status: None   Collection Time: 08/12/21  4:25 PM   Specimen: Nasopharyngeal Swab; Nasopharyngeal(NP) swabs in vial transport medium  Result Value Ref Range Status   SARS Coronavirus 2 by RT PCR NEGATIVE NEGATIVE Final    Comment: (NOTE) SARS-CoV-2 target nucleic acids are NOT DETECTED.  The SARS-CoV-2 RNA is generally detectable in upper  respiratory specimens during the acute phase of infection. The lowest concentration of SARS-CoV-2 viral copies this assay can detect is 138 copies/mL. A negative result does not preclude SARS-Cov-2 infection and should not be used as the sole basis for treatment or other patient management decisions. A negative result may occur with  improper specimen collection/handling, submission of specimen other than nasopharyngeal swab, presence of viral mutation(s) within the areas targeted by this assay, and inadequate number of viral copies(<138 copies/mL). A negative result must be combined with clinical observations, patient history, and epidemiological information. The expected result is Negative.  Fact Sheet for Patients:  EntrepreneurPulse.com.au  Fact Sheet for Healthcare Providers:  IncredibleEmployment.be  This test is no t yet approved or cleared by the [place] FDA and  has been authorized for detection and/or diagnosis of SARS-CoV-2 by FDA under an Emergency Use Authorization (EUA). This EUA will remain  in effect (meaning this test can be used) for the duration of the COVID-19 declaration under Section 564(b)(1) of the Act, 21 U.S.C.section 360bbb-3(b)(1), unless the authorization is terminated  or revoked sooner.       Influenza A by PCR NEGATIVE NEGATIVE Final   Influenza B by PCR NEGATIVE NEGATIVE Final    Comment: (NOTE) The Xpert Xpress SARS-CoV-2/FLU/RSV plus assay is intended as an aid in the diagnosis of influenza from Nasopharyngeal swab specimens and should not be used as a sole basis for treatment. Nasal washings and aspirates are unacceptable for Xpert Xpress SARS-CoV-2/FLU/RSV testing.  Fact Sheet for Patients: EntrepreneurPulse.com.au  Fact Sheet for Healthcare Providers: IncredibleEmployment.be  This test is not yet approved or cleared by the [place] FDA and has been  authorized for detection and/or diagnosis of SARS-CoV-2 by FDA under an Emergency Use Authorization (EUA). This EUA will remain in effect (meaning this test can be used) for the duration of the COVID-19 declaration under Section 564(b)(1) of the Act, 21 U.S.C. section 360bbb-3(b)(1), unless the authorization is terminated or revoked.  Performed at [facility], [place] [address] [place], [place] [address]   MRSA Next Gen by PCR, Nasal     Status: None  Collection Time: 08/13/21  6:36 AM   Specimen: Nasal Mucosa; Nasal Swab  Result Value Ref Range Status   MRSA by PCR Next Gen NOT DETECTED NOT DETECTED Final    Comment: (NOTE) The GeneXpert MRSA Assay (FDA approved for NASAL specimens only), is one component of a comprehensive MRSA colonization surveillance program. It is not intended to diagnose MRSA infection nor to guide or monitor treatment for MRSA infections. Test performance is not FDA approved in patients less than 15 years old. Performed at [facility], [place] [address], [place], [place] [address]      Radiology Studies: DG CHEST PORT 1 VIEW  Result Date: 08/14/2021 CLINICAL DATA:  COPD EXAM: PORTABLE CHEST 1 VIEW COMPARISON:  Chest x-ray dated June 25, 2021 FINDINGS: Cardiac and mediastinal contours within normal limits. Focal consolidation. New mild hazy right basilar opacity, possibly due to atelectasis or layering pleural effusion. No evidence of pneumothorax. IMPRESSION: New mild hazy right basilar opacity, possibly due to atelectasis or small layering pleural effusion. Electronically Signed   By: Yetta Glassman M.D.   On: 08/14/2021 13:54     Marzetta Board, MD, PhD [facility]  Between 7 am - 7 pm I am available, please contact me via Amion (for emergencies) or Securechat (non urgent messages)  Between 7 pm - 7 am I am not available, please contact night coverage MD/APP via Amion

## 2021-08-15 NOTE — Evaluation (Signed)
Physical Therapy Evaluation Patient Details Name: Ian Boyd MRN: [id number] DOB: 04-26-1950 Today's Date: 08/15/2021  History of Present Illness  Patient is 71 y.o. male recently hospitalized in April 2023 with E. 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  He had a drain placed at that time, and ID followed patient while hospitalized.  Eventually improved, follow-up with ID as an outpatient about 3 weeks ago, and was supposed to stay on cefadroxil until 5/25.  He underwent treatment for his nephrolithiasis 5/22 with cystoscopy, right ureteroscopy, stone extraction and right ureteral stent placement.  24 hours afterwards developed a fever, tachycardia, and was brought to the hospital.  He was found to have Pseudomonas bacteremia as well as Pseudomonas UTI. PMH significant of hypertension, recent psoas abscess, COPD, nephrolithiasis, comes into the hospital from the SNF due to fever and tachycardia.    Clinical Impression  Ian Boyd is 71 y.o. male admitted with above HPI and diagnosis. Patient is currently limited by functional impairments below (see PT problem list). Patient presented for SNF and had completed rehab but typically lives with his son and is independent at baseline. Currently pt requires min guard/assist for transfers and gait with no device. VSS throughout mobility. Patient will benefit from continued skilled PT interventions to address impairments and progress independence with mobility, recommending return home with maximized [facility] services. Acute PT will follow and progress as able.        Recommendations for follow up therapy are one component of a multi-disciplinary discharge planning process, led by the attending physician.  Recommendations may be updated based on patient status, additional functional criteria and insurance authorization.  Follow Up Recommendations Home health PT ([place] services with  [facility] PT/OT/Aid if qualifies)    Assistance Recommended at Discharge Intermittent Supervision/Assistance  Patient can return home with the following  A little help with walking and/or transfers;A little help with bathing/dressing/bathroom;Assistance with cooking/housework;Assist for transportation;Help with stairs or ramp for entrance;Direct supervision/assist for medications management    Equipment Recommendations None recommended by PT  Recommendations for Other Services       Functional Status Assessment Patient has had a recent decline in their functional status and demonstrates the ability to make significant improvements in function in a reasonable and predictable amount of time.     Precautions / Restrictions Precautions Precautions: Fall Precaution Comments: Rt RC torn, pt unable to use Rt shoulder bu thas use of elbow, wrist, hand Restrictions Weight Bearing Restrictions: No      Mobility  Bed Mobility Overal bed mobility: Needs Assistance Bed Mobility: Supine to Sit     Supine to sit: Supervision, HOB elevated     General bed mobility comments: HOB slightly elevated, supervision for safety. pt using Lt UE to scoot and pivot, minimal use of Rt UE due to impairments.    Transfers Overall transfer level: Needs assistance Equipment used: None Transfers: Sit to/from Stand Sit to Stand: Min guard           General transfer comment: guarding for safety, pt able to power up from EOB with Lt UE to push up. steady once standing.    Ambulation/Gait Ambulation/Gait assistance: Min assist, Min guard Gait Distance (Feet): 90 Feet Assistive device: None Gait Pattern/deviations: Step-through pattern, Shuffle, Narrow base of support, Decreased dorsiflexion - right, Decreased dorsiflexion - left (forward head posture) Gait velocity: decr     General Gait Details: pt overall steady  wtih no major LOB. Min assist at start and pt progressed to guarding. pt with limited  hip/knee flexion and reduced dorsiflexion leading to low foot clearance during swing phase.  Stairs            Wheelchair Mobility    Modified Rankin (Stroke Patients Only)       Balance Overall balance assessment: Needs assistance Sitting-balance support: Feet supported, Bilateral upper extremity supported Sitting balance-Leahy Scale: Good     Standing balance support: No upper extremity supported, During functional activity Standing balance-Leahy Scale: Good                               Pertinent Vitals/Pain Pain Assessment Pain Assessment: Faces Faces Pain Scale: Hurts a little bit Pain Location: Rt shoulder Pain Descriptors / Indicators: Discomfort Pain Intervention(s): Monitored during session, Repositioned    Home Living Family/patient expects to be discharged to:: Private residence Living Arrangements: Children (lives wtih his son) Available Help at Discharge: Family;Available PRN/intermittently Type of Home: House Home Access: Stairs to enter Entrance Stairs-Rails: None Entrance Stairs-Number of Steps: 3   Home Layout: One level Home Equipment: Hand held shower head;Toilet riser;Rolling Walker (2 wheels);Cane - single point Additional Comments: reports son lives with him but works    Prior Function Prior Level of Function : History of Falls (last six months)             Mobility Comments: pt reports he was ambulating with no AD at [facility] SNF prior to this admission ADLs Comments: Can drive, but does not. Wears glasses for reading. Sister manages finances.     Hand Dominance   Dominant Hand: Right    Extremity/Trunk Assessment   Upper Extremity Assessment Upper Extremity Assessment: RUE deficits/detail RUE Deficits / Details: Massive complete tear of Rt supraspinatus and infraspinsatus. pt has no AROM for shoulder flexion/abduciton (assists with Lt UE). good hand grip. RUE: Shoulder pain with ROM RUE Sensation: WNL RUE  Coordination: WNL    Lower Extremity Assessment Lower Extremity Assessment: Overall WFL for tasks assessed    Cervical / Trunk Assessment Cervical / Trunk Assessment: Normal  Communication   Communication: No difficulties  Cognition Arousal/Alertness: Awake/alert Behavior During Therapy: WFL for tasks assessed/performed Overall Cognitive Status: Within Functional Limits for tasks assessed                                          General Comments      Exercises     Assessment/Plan    PT Assessment Patient needs continued PT services  PT Problem List Decreased strength;Decreased activity tolerance;Decreased balance;Decreased mobility;Decreased knowledge of use of DME;Decreased safety awareness;Decreased knowledge of precautions       PT Treatment Interventions DME instruction;Gait training;Stair training;Functional mobility training;Therapeutic activities;Balance training;Therapeutic exercise;Patient/family education    PT Goals (Current goals can be found in the Care Plan section)  Acute Rehab PT Goals Patient Stated Goal: to get home as he reports he is paying OOP for the nursing facility PT Goal Formulation: With patient Time For Goal Achievement: 08/29/21 Potential to Achieve Goals: Good    Frequency Min 3X/week     Co-evaluation               AM-PAC PT "6 Clicks" Mobility  Outcome Measure Help needed turning from your back to your side while in  a flat bed without using bedrails?: None Help needed moving from lying on your back to sitting on the side of a flat bed without using bedrails?: None Help needed moving to and from a bed to a chair (including a wheelchair)?: A Little Help needed standing up from a chair using your arms (e.g., wheelchair or bedside chair)?: A Little Help needed to walk in hospital room?: A Little Help needed climbing 3-5 steps with a railing? : A Little 6 Click Score: 20    End of Session Equipment Utilized  During Treatment: Gait belt Activity Tolerance: Patient tolerated treatment well Patient left: in chair;with call bell/phone within reach Nurse Communication: Mobility status PT Visit Diagnosis: Unsteadiness on feet (R26.81);Other abnormalities of gait and mobility (R26.89);Difficulty in walking, not elsewhere classified (R26.2)    Time: 1749-4496 PT Time Calculation (min) (ACUTE ONLY): 19 min   Charges:   PT Evaluation $PT Eval Low Complexity: 1 Low          Verner Mould, DPT Acute Rehabilitation Services Office [phone] Pager [phone]  08/15/21 2:34 PM

## 2021-08-16 DIAGNOSIS — N179 Acute kidney failure, unspecified: Secondary | ICD-10-CM | POA: Diagnosis not present

## 2021-08-16 DIAGNOSIS — A4152 Sepsis due to Pseudomonas: Secondary | ICD-10-CM | POA: Diagnosis not present

## 2021-08-16 DIAGNOSIS — R6520 Severe sepsis without septic shock: Secondary | ICD-10-CM | POA: Diagnosis not present

## 2021-08-16 LAB — CBC
HCT: 33.4 % — ABNORMAL LOW (ref 39.0–52.0)
Hemoglobin: 10.9 g/dL — ABNORMAL LOW (ref 13.0–17.0)
MCH: 26.3 pg (ref 26.0–34.0)
MCHC: 32.6 g/dL (ref 30.0–36.0)
MCV: 80.7 fL (ref 80.0–100.0)
Platelets: 232 10*3/uL (ref 150–400)
RBC: 4.14 MIL/uL — ABNORMAL LOW (ref 4.22–5.81)
RDW: 15.7 % — ABNORMAL HIGH (ref 11.5–15.5)
WBC: 14.4 10*3/uL — ABNORMAL HIGH (ref 4.0–10.5)
nRBC: 0 % (ref 0.0–0.2)

## 2021-08-16 LAB — BASIC METABOLIC PANEL
Anion gap: 9 (ref 5–15)
BUN: 15 mg/dL (ref 8–23)
CO2: 22 mmol/L (ref 22–32)
Calcium: 8.3 mg/dL — ABNORMAL LOW (ref 8.9–10.3)
Chloride: 104 mmol/L (ref 98–111)
Creatinine, Ser: 1.05 mg/dL (ref 0.61–1.24)
GFR, Estimated: >60 mL/min (ref >60)
Glucose, Bld: 104 mg/dL — ABNORMAL HIGH (ref 70–99)
Potassium: 2.9 mmol/L — ABNORMAL LOW (ref 3.5–5.1)
Sodium: 135 mmol/L (ref 135–145)

## 2021-08-16 MED ORDER — POTASSIUM CHLORIDE 10 MEQ/100ML IV SOLN
10.0000 meq | INTRAVENOUS | Status: AC
  Filled 2021-08-16: qty 100

## 2021-08-16 MED ORDER — POTASSIUM CHLORIDE 10 MEQ/100ML IV SOLN
10.0000 meq | INTRAVENOUS | Status: AC
  Administered 2021-08-16 (×2): 10 meq via INTRAVENOUS
  Filled 2021-08-16: qty 100

## 2021-08-16 MED ORDER — POTASSIUM CHLORIDE CRYS ER 20 MEQ PO TBCR
40.0000 meq | EXTENDED_RELEASE_TABLET | Freq: Once | ORAL | Status: AC
  Administered 2021-08-16: 40 meq via ORAL
  Filled 2021-08-16: qty 2

## 2021-08-16 NOTE — Progress Notes (Signed)
PROGRESS NOTE  Ian Boyd WUJ:[id number] DOB: 03/19/1951 DOA: 08/12/2021 PCP: Curlene Labrum, MD   LOS: 4 days   Brief Narrative / Interim history: 71 y.o. male with medical history significant of hypertension, recent psoas abscess, COPD, nephrolithiasis, comes into the hospital from the SNF due to fever and tachycardia.  He was recently hospitalized in April 2023 with E. 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  He had a drain placed at that time, and ID followed patient while hospitalized.  Eventually improved, follow-up with ID as an outpatient about 3 weeks ago, and was supposed to stay on cefadroxil until 5/25.  He underwent treatment for his nephrolithiasis 5/22 with cystoscopy, right ureteroscopy, stone extraction and right ureteral stent placement.  24 hours afterwards developed a fever, tachycardia, and was brought to the hospital.  He was found to have Pseudomonas bacteremia as well as Pseudomonas UTI  Subjective / 24h Interval events: Feeling better this morning, feeling stronger.  Worked with physical therapy yesterday, thinks he is too weak to be home  Assesement and Plan: Principal Problem:   Severe sepsis without septic shock ([place]) Active Problems:   Psoas abscess, right ([place])   Hydronephrosis of left kidney   AKI (acute kidney injury) ([place])   Urinary tract infection   Nephrolithiasis   Hepatitis C   Bacteremia due to Pseudomonas  Principal problem Sepsis due to Pseudomonas UTI and Pseudomonas bacteremia-patient presents 24 hours after instrumentation with elevated WBC, fever, tachycardia and pyuria.  Blood cultures and urine cultures were sent, speciated Pseudomonas.  ID consulted.  Continue meropenem for now, when clinically improving white count gets better could potentially be transitioned to Cipro for total of 14 days. Dose on dc will be 500 mg po q8h or 750 mg po q12h per pharmacy, due to  pseudomonas bacteremia -CT scan of the abdomen and pelvis raised suspicion for diverticulitis however he has no abdominal pain and his exam is unremarkable.    Active problems Nephrolithiasis/bladder stone, left kidney hydronephrosis-patient just underwent cystoscopy with the prior to admission with laser lithotripsy on the right and extraction of stones, he also had a right ureteral stent placement.  Also underwent a cystoscopy which showed bladder diverticuli.  He also has a left percutaneous nephrostomy tube.  Urology consulted, L PCN tube removed on 5/26, Foley catheter replaced on 5/26 also.  Hypokalemia-got Lasix yesterday.  Replete and recheck tomorrow morning  Essential hypertension-blood pressure little bit on the high side but overall acceptable, continue amlodipine   Acute kidney injury-due to sepsis, creatinine improving with fluids   Urinary retention-continue home medications   COPD-stable, no wheezing, continue home inhalers   Recent psoas abscess, recent E. coli bacteremia-was already on antibiotics as an outpatient for 2 additional days with plan to end on 5/25.  Currently on meropenem   Right rotator cuff tear-chronic, has been going on for the past 3 years  History of hep C-ID will follow as an outpatient  Scheduled Meds:  amLODipine  5 mg Oral Daily   Chlorhexidine Gluconate Cloth  6 each Topical Daily   dorzolamide-timolol  1 drop Left Eye BID   feeding supplement  237 mL Oral BID BM   heparin  5,000 Units Subcutaneous Q8H   latanoprost  1 drop Left Eye QHS   multivitamin with minerals  1 tablet Oral Daily   Netarsudil Dimesylate  1 drop Left Eye QHS   tamsulosin  0.4 mg Oral QHS   umeclidinium bromide  1 puff Inhalation Daily   Continuous Infusions:  meropenem (MERREM) IV 1 g (08/16/21 0908)   PRN Meds:.acetaminophen **OR** acetaminophen, docusate sodium, guaiFENesin-dextromethorphan, hydrALAZINE, ipratropium-albuterol, lip balm  Diet Orders (From  admission, onward)     Start     Ordered   08/12/21 2115  Diet Heart Room service appropriate? Yes; Fluid consistency: Thin  Diet effective now       Question Answer Comment  Room service appropriate? Yes   Fluid consistency: Thin      08/12/21 2115            DVT prophylaxis: heparin injection 5,000 Units Start: 08/12/21 2200   Lab Results  Component Value Date   PLT 232 08/16/2021      Code Status: Full Code  Family Communication: no family at bedside   Status is: Inpatient  Remains inpatient appropriate because: Potassium, PT  Level of care: Progressive  Consultants:  Urology   Procedures:  none  Microbiology  Urine cultures - pseudomonas  Objective: Vitals:   08/15/21 2045 08/15/21 2200 08/16/21 0610 08/16/21 0813  BP: (!) 149/89  (!) 153/87   Pulse: (!) 110  93   Resp: '20 19 18   '$ Temp: 99.5 F (37.5 C)  98.1 F (36.7 C)   TempSrc: Oral  Oral   SpO2: 97%  98% 97%  Weight:      Height:        Intake/Output Summary (Last 24 hours) at 08/16/2021 7169 Last data filed at 08/16/2021 0600 Gross per 24 hour  Intake 663.68 ml  Output 4700 ml  Net -4036.32 ml    Wt Readings from Last 3 Encounters:  08/12/21 63.6 kg  08/11/21 57.6 kg  06/29/21 57.3 kg    Examination:  Constitutional: NAD Eyes: lids and conjunctivae normal, no scleral icterus ENMT: mmm Neck: normal, supple Respiratory: clear to auscultation bilaterally, no wheezing, no crackles. Normal respiratory effort.  Cardiovascular: Regular rate and rhythm, no murmurs / rubs / gallops. No LE edema. Abdomen: soft, no distention, no tenderness. Bowel sounds positive.  Skin: no rashes Neurologic: no focal deficits, equal strength   Data Reviewed: I have independently reviewed following labs and imaging studies  CBC Recent Labs  Lab 08/12/21 1552 08/13/21 0105 08/14/21 0449 08/15/21 0524 08/16/21 0440  WBC 26.7* 33.2* 23.2* 17.1* 14.4*  HGB 11.0* 10.2* 9.7* 9.6* 10.9*  HCT 34.5*  31.8* 30.8* 30.8* 33.4*  PLT 320 302 217 226 232  MCV 85.2 82.6 84.6 84.2 80.7  MCH 27.2 26.5 26.6 26.2 26.3  MCHC 31.9 32.1 31.5 31.2 32.6  RDW 15.9* 16.1* 16.0* 15.8* 15.7*  LYMPHSABS 1.0  --   --   --   --   MONOABS 2.0*  --   --   --   --   EOSABS 0.0  --   --   --   --   BASOSABS 0.0  --   --   --   --      Recent Labs  Lab 08/11/21 1158 08/11/21 1158 08/12/21 1552 08/12/21 1601 08/12/21 1838 08/12/21 2232 08/13/21 0105 08/13/21 0436 08/13/21 0658 08/14/21 0449 08/15/21 0524 08/16/21 0440  NA 139  --  137  --   --   --  138  --   --  140 139 135  K 4.1  --  4.1  --   --   --  4.1  --   --  3.6 3.6  2.9*  CL 107  --  105  --   --   --  108  --   --  116* 114* 104  CO2 22  --  22  --   --   --  22  --   --  21* 19* 22  GLUCOSE 93  --  119*  --   --   --  99  --   --  93 101* 104*  BUN 12  --  20  --   --   --  21  --   --  '22 21 15  '$ CREATININE 1.15  --  1.79*  --   --   --  1.75*  --   --  1.43* 1.16 1.05  CALCIUM 9.2  --  8.4*  --   --   --  8.2*  --   --  8.0* 8.0* 8.3*  AST 50*  --  54*  --   --   --  51*  --   --  24  --   --   ALT 41  --  39  --   --   --  38  --   --  23  --   --   ALKPHOS 102  --  83  --   --   --  70  --   --  60  --   --   BILITOT 0.6  --  0.7  --   --   --  1.0  --   --  0.6  --   --   ALBUMIN 3.3*  --  2.8*  --   --   --  2.5*  --   --  2.2*  --   --   MG  --   --   --   --   --   --   --  1.3*  --  2.1 2.0  --   LATICACIDVEN  --    < > 4.3*  --  3.4* 4.0* 2.6* 2.0* 2.3*  --   --   --   INR  --   --   --  1.2  --   --   --   --   --   --   --   --    < > = values in this interval not displayed.     ------------------------------------------------------------------------------------------------------------------ No results for input(s): CHOL, HDL, LDLCALC, TRIG, CHOLHDL, LDLDIRECT in the last 72 hours.  No results found for:  HGBA1C ------------------------------------------------------------------------------------------------------------------ No results for input(s): TSH, T4TOTAL, T3FREE, THYROIDAB in the last 72 hours.  Invalid input(s): FREET3  Cardiac Enzymes No results for input(s): CKMB, TROPONINI, MYOGLOBIN in the last 168 hours.  Invalid input(s): CK ------------------------------------------------------------------------------------------------------------------ No results found for: BNP  CBG: No results for input(s): GLUCAP in the last 168 hours.  Recent Results (from the past 240 hour(s))  Blood culture (routine x 2)     Status: Abnormal   Collection Time: 08/12/21  3:52 PM   Specimen: BLOOD  Result Value Ref Range Status   Specimen Description BLOOD LEFT ANTECUBITAL  Final   Special Requests   Final    BOTTLES DRAWN AEROBIC AND ANAEROBIC Blood Culture adequate volume   Culture  Setup Time   Final    GRAM NEGATIVE RODS IN BOTH AEROBIC AND ANAEROBIC BOTTLES CRITICAL RESULT CALLED TO, READ BACK BY AND VERIFIED WITH: PHARMD A ELLINGTON [id number] FCP  Culture PSEUDOMONAS AERUGINOSA (A)  Final   Report Status 08/15/2021 FINAL  Final   Organism ID, Bacteria PSEUDOMONAS AERUGINOSA  Final      Susceptibility   Pseudomonas aeruginosa - MIC*    CEFTAZIDIME 8 SENSITIVE Sensitive     CIPROFLOXACIN <=0.25 SENSITIVE Sensitive     GENTAMICIN 2 SENSITIVE Sensitive     IMIPENEM 2 SENSITIVE Sensitive     CEFEPIME Value in next row Resistant      RESISTANT16Performed at [facility] [address], [place], [place] [address]    * PSEUDOMONAS AERUGINOSA  Blood culture (routine x 2)     Status: Abnormal   Collection Time: 08/12/21  3:52 PM   Specimen: BLOOD  Result Value Ref Range Status   Specimen Description   Final    BLOOD LEFT ANTECUBITAL Performed at [facility] [address] [place], [place] [address]    Special Requests   Final    BOTTLES DRAWN AEROBIC  AND ANAEROBIC Blood Culture adequate volume Performed at [facility] [address], [place], [place] [address]    Culture  Setup Time   Final    GRAM NEGATIVE RODS AEROBIC BOTTLE ONLY CRITICAL VALUE NOTED.  VALUE IS CONSISTENT WITH PREVIOUSLY REPORTED AND CALLED VALUE.    Culture (A)  Final    PSEUDOMONAS AERUGINOSA SUSCEPTIBILITIES PERFORMED ON PREVIOUS CULTURE WITHIN THE LAST 5 DAYS. Performed at [facility], [place] [address], [place], [place] [address]    Report Status 08/15/2021 FINAL  Final  Blood Culture ID Panel (Reflexed)     Status: Abnormal   Collection Time: 08/12/21  3:52 PM  Result Value Ref Range Status   Enterococcus faecalis NOT DETECTED NOT DETECTED Final   Enterococcus Faecium NOT DETECTED NOT DETECTED Final   Listeria monocytogenes NOT DETECTED NOT DETECTED Final   Staphylococcus species NOT DETECTED NOT DETECTED Final   Staphylococcus aureus (BCID) NOT DETECTED NOT DETECTED Final   Staphylococcus epidermidis NOT DETECTED NOT DETECTED Final   Staphylococcus lugdunensis NOT DETECTED NOT DETECTED Final   Streptococcus species NOT DETECTED NOT DETECTED Final   Streptococcus agalactiae NOT DETECTED NOT DETECTED Final   Streptococcus pneumoniae NOT DETECTED NOT DETECTED Final   Streptococcus pyogenes NOT DETECTED NOT DETECTED Final   A.calcoaceticus-baumannii NOT DETECTED NOT DETECTED Final   Bacteroides fragilis NOT DETECTED NOT DETECTED Final   Enterobacterales NOT DETECTED NOT DETECTED Final   Enterobacter cloacae complex NOT DETECTED NOT DETECTED Final   Escherichia coli NOT DETECTED NOT DETECTED Final   Klebsiella aerogenes NOT DETECTED NOT DETECTED Final   Klebsiella oxytoca NOT DETECTED NOT DETECTED Final   Klebsiella pneumoniae NOT DETECTED NOT DETECTED Final   Proteus species NOT DETECTED NOT DETECTED Final   Salmonella species NOT DETECTED NOT DETECTED Final   Serratia marcescens NOT DETECTED NOT DETECTED Final   Haemophilus  influenzae NOT DETECTED NOT DETECTED Final   Neisseria meningitidis NOT DETECTED NOT DETECTED Final   Pseudomonas aeruginosa DETECTED (A) NOT DETECTED Final    Comment: CRITICAL RESULT CALLED TO, READ BACK BY AND VERIFIED WITH: PHARMD A ELLINGTON [id number] FCP    Stenotrophomonas maltophilia NOT DETECTED NOT DETECTED Final   Candida albicans NOT DETECTED NOT DETECTED Final   Candida auris NOT DETECTED NOT DETECTED Final   Candida glabrata NOT DETECTED NOT DETECTED Final   Candida krusei NOT DETECTED NOT DETECTED Final   Candida parapsilosis NOT DETECTED NOT DETECTED Final   Candida tropicalis NOT DETECTED NOT  DETECTED Final   Cryptococcus neoformans/gattii NOT DETECTED NOT DETECTED Final   CTX-M ESBL NOT DETECTED NOT DETECTED Final   Carbapenem resistance IMP NOT DETECTED NOT DETECTED Final   Carbapenem resistance KPC NOT DETECTED NOT DETECTED Final   Carbapenem resistance NDM NOT DETECTED NOT DETECTED Final   Carbapenem resistance VIM NOT DETECTED NOT DETECTED Final    Comment: Performed at [facility], [place] [address], [place], [place] [address]  Urine Culture     Status: Abnormal   Collection Time: 08/12/21  4:25 PM   Specimen: Urine, Clean Catch  Result Value Ref Range Status   Specimen Description URINE, CLEAN CATCH  Final   Special Requests NONE  Final   Culture >=100,000 COLONIES/mL PSEUDOMONAS AERUGINOSA (A)  Final   Report Status 08/14/2021 FINAL  Final   Organism ID, Bacteria PSEUDOMONAS AERUGINOSA (A)  Final      Susceptibility   Pseudomonas aeruginosa - MIC*    CEFTAZIDIME 4 SENSITIVE Sensitive     CIPROFLOXACIN 0.5 SENSITIVE Sensitive     GENTAMICIN 4 SENSITIVE Sensitive     IMIPENEM 2 SENSITIVE Sensitive     CEFEPIME Value in next row Sensitive      SENSITIVE8Performed at [facility] [address], [place], [place] [address]    * >=100,000 COLONIES/mL PSEUDOMONAS AERUGINOSA  Resp Panel by RT-PCR (Flu A&B, Covid) Nasopharyngeal Swab     Status: None    Collection Time: 08/12/21  4:25 PM   Specimen: Nasopharyngeal Swab; Nasopharyngeal(NP) swabs in vial transport medium  Result Value Ref Range Status   SARS Coronavirus 2 by RT PCR NEGATIVE NEGATIVE Final    Comment: (NOTE) SARS-CoV-2 target nucleic acids are NOT DETECTED.  The SARS-CoV-2 RNA is generally detectable in upper respiratory specimens during the acute phase of infection. The lowest concentration of SARS-CoV-2 viral copies this assay can detect is 138 copies/mL. A negative result does not preclude SARS-Cov-2 infection and should not be used as the sole basis for treatment or other patient management decisions. A negative result may occur with  improper specimen collection/handling, submission of specimen other than nasopharyngeal swab, presence of viral mutation(s) within the areas targeted by this assay, and inadequate number of viral copies(<138 copies/mL). A negative result must be combined with clinical observations, patient history, and epidemiological information. The expected result is Negative.  Fact Sheet for Patients:  EntrepreneurPulse.com.au  Fact Sheet for Healthcare Providers:  IncredibleEmployment.be  This test is no t yet approved or cleared by the [place] FDA and  has been authorized for detection and/or diagnosis of SARS-CoV-2 by FDA under an Emergency Use Authorization (EUA). This EUA will remain  in effect (meaning this test can be used) for the duration of the COVID-19 declaration under Section 564(b)(1) of the Act, 21 U.S.C.section 360bbb-3(b)(1), unless the authorization is terminated  or revoked sooner.       Influenza A by PCR NEGATIVE NEGATIVE Final   Influenza B by PCR NEGATIVE NEGATIVE Final    Comment: (NOTE) The Xpert Xpress SARS-CoV-2/FLU/RSV plus assay is intended as an aid in the diagnosis of influenza from Nasopharyngeal swab specimens and should not be used as a sole basis for treatment.  Nasal washings and aspirates are unacceptable for Xpert Xpress SARS-CoV-2/FLU/RSV testing.  Fact Sheet for Patients: EntrepreneurPulse.com.au  Fact Sheet for Healthcare Providers: IncredibleEmployment.be  This test is not yet approved or cleared by the [place] FDA and has been authorized for detection and/or diagnosis of SARS-CoV-2 by FDA under  an Emergency Use Authorization (EUA). This EUA will remain in effect (meaning this test can be used) for the duration of the COVID-19 declaration under Section 564(b)(1) of the Act, 21 U.S.C. section 360bbb-3(b)(1), unless the authorization is terminated or revoked.  Performed at [facility], [place] [address] [place], [place] [address]   MRSA Next Gen by PCR, Nasal     Status: None   Collection Time: 08/13/21  6:36 AM   Specimen: Nasal Mucosa; Nasal Swab  Result Value Ref Range Status   MRSA by PCR Next Gen NOT DETECTED NOT DETECTED Final    Comment: (NOTE) The GeneXpert MRSA Assay (FDA approved for NASAL specimens only), is one component of a comprehensive MRSA colonization surveillance program. It is not intended to diagnose MRSA infection nor to guide or monitor treatment for MRSA infections. Test performance is not FDA approved in patients less than 17 years old. Performed at [facility], [place] [address], [place],  [address]      Radiology Studies: No results found.   Marzetta Board, MD, PhD [facility]  Between 7 am - 7 pm I am available, please contact me via Amion (for emergencies) or Securechat (non urgent messages)  Between 7 pm - 7 am I am not available, please contact night coverage MD/APP via Amion

## 2021-08-17 DIAGNOSIS — B965 Pseudomonas (aeruginosa) (mallei) (pseudomallei) as the cause of diseases classified elsewhere: Secondary | ICD-10-CM

## 2021-08-17 DIAGNOSIS — R7881 Bacteremia: Secondary | ICD-10-CM | POA: Diagnosis not present

## 2021-08-17 DIAGNOSIS — R6520 Severe sepsis without septic shock: Secondary | ICD-10-CM | POA: Diagnosis not present

## 2021-08-17 DIAGNOSIS — B182 Chronic viral hepatitis C: Secondary | ICD-10-CM

## 2021-08-17 DIAGNOSIS — A419 Sepsis, unspecified organism: Secondary | ICD-10-CM | POA: Diagnosis not present

## 2021-08-17 LAB — CBC
HCT: 34.2 % — ABNORMAL LOW (ref 39.0–52.0)
Hemoglobin: 10.8 g/dL — ABNORMAL LOW (ref 13.0–17.0)
MCH: 25.9 pg — ABNORMAL LOW (ref 26.0–34.0)
MCHC: 31.6 g/dL (ref 30.0–36.0)
MCV: 82 fL (ref 80.0–100.0)
Platelets: 257 10*3/uL (ref 150–400)
RBC: 4.17 MIL/uL — ABNORMAL LOW (ref 4.22–5.81)
RDW: 15.9 % — ABNORMAL HIGH (ref 11.5–15.5)
WBC: 16.9 10*3/uL — ABNORMAL HIGH (ref 4.0–10.5)
nRBC: 0 % (ref 0.0–0.2)

## 2021-08-17 LAB — BASIC METABOLIC PANEL
Anion gap: 9 (ref 5–15)
BUN: 15 mg/dL (ref 8–23)
CO2: 22 mmol/L (ref 22–32)
Calcium: 8.4 mg/dL — ABNORMAL LOW (ref 8.9–10.3)
Chloride: 104 mmol/L (ref 98–111)
Creatinine, Ser: 1.09 mg/dL (ref 0.61–1.24)
GFR, Estimated: >60 mL/min (ref >60)
Glucose, Bld: 85 mg/dL (ref 70–99)
Potassium: 3.6 mmol/L (ref 3.5–5.1)
Sodium: 135 mmol/L (ref 135–145)

## 2021-08-17 LAB — MAGNESIUM: Magnesium: 1.9 mg/dL (ref 1.7–2.4)

## 2021-08-17 NOTE — NC FL2 (Signed)
[place] LEVEL OF CARE SCREENING TOOL     IDENTIFICATION  Patient Name: Ian Boyd Birthdate: 1950-04-08 Sex: male Admission Date (Current Location): 08/12/2021  [place] and [place] Number:  Kathleen Argue [id number] [phone] Facility and Address:  [facility],  [place], [place]      Provider Number: [id number]  Attending Physician Name and Address:  Caren Griffins, MD  Relative Name and Phone Number:  Vaughan Basta [place](sister) [place]: Hospital Recommended Level of Care: Nursing Facility Prior Approval Number:    Date Approved/Denied:   PASRR Number:    Discharge Plan: Other (Comment) ([facility])    Current Diagnoses: Patient Active Problem List   Diagnosis Date Noted   Severe sepsis without septic shock ([place]) 08/12/2021   Hyponatremia 07/01/2021   Thrombocytopenia ([place]) 06/27/2021   Hypokalemia 06/27/2021   Bacteremia due to Pseudomonas 06/26/2021   Psoas abscess, right ([place]) 06/25/2021   Hydronephrosis of left kidney 06/25/2021   Urinary tract infection 06/25/2021   Cocaine use 06/25/2021   Rotator cuff tear arthropathy 06/25/2021   Nephrolithiasis 06/25/2021   AKI (acute kidney injury) ([place]) 06/25/2021   Esophageal dysphagia 05/04/2018   Dysphagia 05/02/2018   Hepatitis C 05/02/2018    Orientation RESPIRATION BLADDER Height & Weight     Self, Time, Situation  Normal Continent Weight: 63.6 kg Height:  6' (182.9 cm)  BEHAVIORAL SYMPTOMS/MOOD NEUROLOGICAL BOWEL NUTRITION STATUS      Continent Diet (Heart Healthy)  AMBULATORY STATUS COMMUNICATION OF NEEDS Skin   Limited Assist Verbally                         Personal Care Assistance Level of Assistance  Bathing, Feeding, Dressing Bathing Assistance: Limited assistance Feeding assistance: Limited assistance Dressing Assistance: Limited assistance     Functional Limitations Info  Sight, Hearing, Speech Sight Info:  Adequate Hearing Info: Adequate Speech Info: Adequate    SPECIAL CARE FACTORS FREQUENCY  PT (By licensed PT), OT (By licensed OT)     PT Frequency:  (3x week) OT Frequency:  (3x week)            Contractures Contractures Info: Not present    Additional Factors Info  Code Status, Allergies Code Status Info:  (Full) Allergies Info:  (NKA)           Current Medications (08/17/2021):  This is the current hospital active medication list Current Facility-Administered Medications  Medication Dose Route Frequency Provider Last Rate Last Admin   acetaminophen (TYLENOL) tablet 650 mg  650 mg Oral Q6H PRN Caren Griffins, MD   650 mg at 08/16/21 1724   Or   acetaminophen (TYLENOL) suppository 650 mg  650 mg Rectal Q6H PRN Caren Griffins, MD       amLODipine (NORVASC) tablet 5 mg  5 mg Oral Daily Marzetta Board M, MD   5 mg at 08/17/21 1010   Chlorhexidine Gluconate Cloth 2 % PADS 6 each  6 each Topical Daily Caren Griffins, MD   6 each at 08/17/21 1010   docusate sodium (COLACE) capsule 100 mg  100 mg Oral Daily PRN Caren Griffins, MD       dorzolamide-timolol (COSOPT) 22.3-6.8 MG/ML ophthalmic solution 1 drop  1 drop Left Eye BID Caren Griffins, MD   1 drop at 08/17/21 1011   feeding supplement (ENSURE ENLIVE / ENSURE PLUS) liquid 237 mL  237 mL  Oral BID BM Caren Griffins, MD   237 mL at 08/17/21 1011   guaiFENesin-dextromethorphan (ROBITUSSIN DM) 100-10 MG/5ML syrup 5 mL  5 mL Oral Q4H PRN Caren Griffins, MD   5 mL at 08/14/21 0904   heparin injection 5,000 Units  5,000 Units Subcutaneous Q8H Caren Griffins, MD   5,000 Units at 08/17/21 0515   hydrALAZINE (APRESOLINE) injection 10 mg  10 mg Intravenous Q6H PRN Caren Griffins, MD   10 mg at 08/15/21 1844   ipratropium-albuterol (DUONEB) 0.5-2.5 (3) MG/3ML nebulizer solution 3 mL  3 mL Nebulization Q6H PRN Caren Griffins, MD   3 mL at 08/15/21 0827   latanoprost (XALATAN) 0.005 % ophthalmic solution 1 drop   1 drop Left Eye QHS Caren Griffins, MD   1 drop at 08/16/21 2152   lip balm (CARMEX) ointment   Topical PRN Caren Griffins, MD       meropenem (MERREM) 1 g in sodium chloride 0.9 % 100 mL IVPB  1 g Intravenous Q8H Vu, Trung T, MD 200 mL/hr at 08/17/21 0843 1 g at 08/17/21 0843   multivitamin with minerals tablet 1 tablet  1 tablet Oral Daily Caren Griffins, MD   1 tablet at 08/17/21 1010   Netarsudil Dimesylate 0.02 % SOLN 1 drop  1 drop Left Eye QHS Caren Griffins, MD       tamsulosin (FLOMAX) capsule 0.4 mg  0.4 mg Oral QHS Marzetta Board M, MD   0.4 mg at 08/16/21 2149   umeclidinium bromide (INCRUSE ELLIPTA) 62.5 MCG/ACT 1 puff  1 puff Inhalation Daily Caren Griffins, MD   1 puff at 08/17/21 0745     Discharge Medications: Please see discharge summary for a list of discharge medications.  Relevant Imaging Results:  Relevant Lab Results:   Additional Information SSN [id number]  Dessa Phi, RN

## 2021-08-17 NOTE — TOC Progression Note (Addendum)
Transition of Care [facility]) - Progression Note    Patient Details  Name: Ian Boyd MRN: [id number] Date of Birth: September 29, 1950  [facility] CM/SW Contact  Kiauna Zywicki, Juliann Pulse, RN Phone Number: 08/17/2021, 10:25 AM  Clinical Narrative: spoke to patient/sister Ian Boyd-both agree to return back to [facility] beds available today)-they are not able to provide 24hr asst @ home once explained difference between LTC vs [facility] services(intermittent).      Expected Discharge Plan: Long Term Nursing Home Barriers to Discharge: Continued Medical Work up  Expected Discharge Plan and Services Expected Discharge Plan: [place]   Discharge Planning Services: CM Consult   Living arrangements for the past 2 months: [place]                                       Social Determinants of Health (SDOH) Interventions    Readmission Risk Interventions     View : No data to display.

## 2021-08-17 NOTE — Progress Notes (Signed)
PROGRESS NOTE  Ian Boyd WER:[id number] DOB: Oct 08, 1950 DOA: 08/12/2021 PCP: Curlene Labrum, MD   LOS: 5 days   Brief Narrative / Interim history: 71 y.o. male with medical history significant of hypertension, recent psoas abscess, COPD, nephrolithiasis, comes into the hospital from the SNF due to fever and tachycardia.  He was recently hospitalized in April 2023 with E. 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  He had a drain placed at that time, and ID followed patient while hospitalized.  Eventually improved, follow-up with ID as an outpatient about 3 weeks ago, and was supposed to stay on cefadroxil until 5/25.  He underwent treatment for his nephrolithiasis 5/22 with cystoscopy, right ureteroscopy, stone extraction and right ureteral stent placement.  24 hours afterwards developed a fever, tachycardia, and was brought to the hospital.  He was found to have Pseudomonas bacteremia as well as Pseudomonas UTI  Subjective / 24h Interval events: Feeling well. Eating breakfast. Feels like his appetite is back. Walked in the hallway last night, felt stronger  Assesement and Plan: Principal Problem:   Severe sepsis without septic shock ([place]) Active Problems:   Psoas abscess, right ([place])   Hydronephrosis of left kidney   AKI (acute kidney injury) ([place])   Urinary tract infection   Nephrolithiasis   Hepatitis C   Bacteremia due to Pseudomonas  Principal problem Sepsis due to Pseudomonas UTI and Pseudomonas bacteremia-patient presents 24 hours after instrumentation with elevated WBC, fever, tachycardia and pyuria.  Blood cultures and urine cultures were sent, speciated Pseudomonas.  ID consulted.  Continue meropenem for now, when clinically improving white count gets better could potentially be transitioned to Cipro for total of 14 days. Dose on dc will be 500 mg po q8h or 750 mg po q12h per pharmacy, due to pseudomonas  bacteremia -CT scan of the abdomen and pelvis raised suspicion for diverticulitis however he has no abdominal pain and his exam is unremarkable.  -WBC slightly up today, monitor closely. Clinically improving, afebrile.    Active problems Nephrolithiasis/bladder stone, left kidney hydronephrosis-patient just underwent cystoscopy with the prior to admission with laser lithotripsy on the right and extraction of stones, he also had a right ureteral stent placement.  Also underwent a cystoscopy which showed bladder diverticuli.  He also has a left percutaneous nephrostomy tube.  Urology consulted, L PCN tube removed on 5/26, Foley catheter replaced on 5/26 also.  Hypokalemia-following Lasix.  Potassium improved, 3.6 this morning.  Magnesium 1.9  Essential hypertension-overall acceptable, continue amlodipine   Acute kidney injury-due to sepsis.  Creatinine has now normalized   Urinary retention-continue home medications   COPD-stable, no wheezing, continue home inhalers   Recent psoas abscess, recent E. coli bacteremia-was already on antibiotics as an outpatient for 2 additional days with plan to end on 5/25.  Currently on meropenem   Right rotator cuff tear-chronic, has been going on for the past 3 years  History of hep C-ID will follow as an outpatient  Scheduled Meds:  amLODipine  5 mg Oral Daily   Chlorhexidine Gluconate Cloth  6 each Topical Daily   dorzolamide-timolol  1 drop Left Eye BID   feeding supplement  237 mL Oral BID BM   heparin  5,000 Units Subcutaneous Q8H   latanoprost  1 drop Left Eye QHS   multivitamin with minerals  1 tablet Oral Daily   Netarsudil Dimesylate  1 drop Left Eye QHS  tamsulosin  0.4 mg Oral QHS   umeclidinium bromide  1 puff Inhalation Daily   Continuous Infusions:  meropenem (MERREM) IV 1 g (08/17/21 0843)   PRN Meds:.acetaminophen **OR** acetaminophen, docusate sodium, guaiFENesin-dextromethorphan, hydrALAZINE, ipratropium-albuterol, lip  balm  Diet Orders (From admission, onward)     Start     Ordered   08/12/21 2115  Diet Heart Room service appropriate? Yes; Fluid consistency: Thin  Diet effective now       Question Answer Comment  Room service appropriate? Yes   Fluid consistency: Thin      08/12/21 2115            DVT prophylaxis: heparin injection 5,000 Units Start: 08/12/21 2200   Lab Results  Component Value Date   PLT 257 08/17/2021      Code Status: Full Code  Family Communication: no family at bedside, updated sister over the phone  Status is: Inpatient  Remains inpatient appropriate because: Slightly elevation in his WBC  Level of care: Progressive  Consultants:  Urology   Procedures:  none  Microbiology  Urine cultures - pseudomonas Blood cultures-Pseudomonas  Objective: Vitals:   08/16/21 1508 08/16/21 2002 08/17/21 0453 08/17/21 0745  BP: (!) 146/83 (!) 154/90 (!) 150/93   Pulse: 97 92 85   Resp: '18 20 17   '$ Temp: 98 F (36.7 C) 98.3 F (36.8 C) 98.3 F (36.8 C)   TempSrc:  Oral Oral   SpO2: 97% 98% 96% 97%  Weight:      Height:        Intake/Output Summary (Last 24 hours) at 08/17/2021 0944 Last data filed at 08/17/2021 0454 Gross per 24 hour  Intake 360 ml  Output 2350 ml  Net -1990 ml    Wt Readings from Last 3 Encounters:  08/12/21 63.6 kg  08/11/21 57.6 kg  06/29/21 57.3 kg    Examination:  Constitutional: NAD Eyes: lids and conjunctivae normal, no scleral icterus ENMT: mmm Neck: normal, supple Respiratory: clear to auscultation bilaterally, no wheezing, no crackles. Normal respiratory effort.  Cardiovascular: Regular rate and rhythm, no murmurs / rubs / gallops. No LE edema. Abdomen: soft, no distention, no tenderness. Bowel sounds positive.  Skin: no rashes Neurologic: no focal deficits, equal strength   Data Reviewed: I have independently reviewed following labs and imaging studies  CBC Recent Labs  Lab 08/12/21 1552 08/13/21 0105  08/14/21 0449 08/15/21 0524 08/16/21 0440 08/17/21 0433  WBC 26.7* 33.2* 23.2* 17.1* 14.4* 16.9*  HGB 11.0* 10.2* 9.7* 9.6* 10.9* 10.8*  HCT 34.5* 31.8* 30.8* 30.8* 33.4* 34.2*  PLT 320 302 217 226 232 257  MCV 85.2 82.6 84.6 84.2 80.7 82.0  MCH 27.2 26.5 26.6 26.2 26.3 25.9*  MCHC 31.9 32.1 31.5 31.2 32.6 31.6  RDW 15.9* 16.1* 16.0* 15.8* 15.7* 15.9*  LYMPHSABS 1.0  --   --   --   --   --   MONOABS 2.0*  --   --   --   --   --   EOSABS 0.0  --   --   --   --   --   BASOSABS 0.0  --   --   --   --   --      Recent Labs  Lab 08/11/21 1158 08/11/21 1158 08/12/21 1552 08/12/21 1601 08/12/21 1838 08/12/21 2232 08/13/21 0105 08/13/21 0436 08/13/21 8119 08/14/21 0449 08/15/21 0524 08/16/21 0440 08/17/21 0433  NA 139  --  137  --   --   --  138  --   --  140 139 135 135  K 4.1  --  4.1  --   --   --  4.1  --   --  3.6 3.6 2.9* 3.6  CL 107  --  105  --   --   --  108  --   --  116* 114* 104 104  CO2 22  --  22  --   --   --  22  --   --  21* 19* 22 22  GLUCOSE 93  --  119*  --   --   --  99  --   --  93 101* 104* 85  BUN 12  --  20  --   --   --  21  --   --  '22 21 15 15  '$ CREATININE 1.15  --  1.79*  --   --   --  1.75*  --   --  1.43* 1.16 1.05 1.09  CALCIUM 9.2  --  8.4*  --   --   --  8.2*  --   --  8.0* 8.0* 8.3* 8.4*  AST 50*  --  54*  --   --   --  51*  --   --  24  --   --   --   ALT 41  --  39  --   --   --  38  --   --  23  --   --   --   ALKPHOS 102  --  83  --   --   --  70  --   --  60  --   --   --   BILITOT 0.6  --  0.7  --   --   --  1.0  --   --  0.6  --   --   --   ALBUMIN 3.3*  --  2.8*  --   --   --  2.5*  --   --  2.2*  --   --   --   MG  --   --   --   --   --   --   --  1.3*  --  2.1 2.0  --  1.9  LATICACIDVEN  --    < > 4.3*  --  3.4* 4.0* 2.6* 2.0* 2.3*  --   --   --   --   INR  --   --   --  1.2  --   --   --   --   --   --   --   --   --    < > = values in this interval not displayed.      ------------------------------------------------------------------------------------------------------------------ No results for input(s): CHOL, HDL, LDLCALC, TRIG, CHOLHDL, LDLDIRECT in the last 72 hours.  No results found for: HGBA1C ------------------------------------------------------------------------------------------------------------------ No results for input(s): TSH, T4TOTAL, T3FREE, THYROIDAB in the last 72 hours.  Invalid input(s): FREET3  Cardiac Enzymes No results for input(s): CKMB, TROPONINI, MYOGLOBIN in the last 168 hours.  Invalid input(s): CK ------------------------------------------------------------------------------------------------------------------ No results found for: BNP  CBG: No results for input(s): GLUCAP in the last 168 hours.  Recent Results (from the past 240 hour(s))  Blood culture (routine x 2)     Status: Abnormal   Collection Time: 08/12/21  3:52 PM   Specimen: BLOOD  Result Value Ref Range Status  Specimen Description BLOOD LEFT ANTECUBITAL  Final   Special Requests   Final    BOTTLES DRAWN AEROBIC AND ANAEROBIC Blood Culture adequate volume   Culture  Setup Time   Final    GRAM NEGATIVE RODS IN BOTH AEROBIC AND ANAEROBIC BOTTLES CRITICAL RESULT CALLED TO, READ BACK BY AND VERIFIED WITH: PHARMD A ELLINGTON [id number] FCP    Culture PSEUDOMONAS AERUGINOSA (A)  Final   Report Status 08/15/2021 FINAL  Final   Organism ID, Bacteria PSEUDOMONAS AERUGINOSA  Final      Susceptibility   Pseudomonas aeruginosa - MIC*    CEFTAZIDIME 8 SENSITIVE Sensitive     CIPROFLOXACIN <=0.25 SENSITIVE Sensitive     GENTAMICIN 2 SENSITIVE Sensitive     IMIPENEM 2 SENSITIVE Sensitive     CEFEPIME Value in next row Resistant      RESISTANT16Performed at [facility], [place] [address] [place], [place] [address]    * PSEUDOMONAS AERUGINOSA  Blood culture (routine x 2)     Status: Abnormal   Collection Time: 08/12/21  3:52 PM   Specimen:  BLOOD  Result Value Ref Range Status   Specimen Description   Final    BLOOD LEFT ANTECUBITAL Performed at [facility] [address], [place], [place] [address]    Special Requests   Final    BOTTLES DRAWN AEROBIC AND ANAEROBIC Blood Culture adequate volume Performed at [facility] [address] [place], [place] [address]    Culture  Setup Time   Final    GRAM NEGATIVE RODS AEROBIC BOTTLE ONLY CRITICAL VALUE NOTED.  VALUE IS CONSISTENT WITH PREVIOUSLY REPORTED AND CALLED VALUE.    Culture (A)  Final    PSEUDOMONAS AERUGINOSA SUSCEPTIBILITIES PERFORMED ON PREVIOUS CULTURE WITHIN THE LAST 5 DAYS. Performed at [facility], [place] [address], [place], [place] [address]    Report Status 08/15/2021 FINAL  Final  Blood Culture ID Panel (Reflexed)     Status: Abnormal   Collection Time: 08/12/21  3:52 PM  Result Value Ref Range Status   Enterococcus faecalis NOT DETECTED NOT DETECTED Final   Enterococcus Faecium NOT DETECTED NOT DETECTED Final   Listeria monocytogenes NOT DETECTED NOT DETECTED Final   Staphylococcus species NOT DETECTED NOT DETECTED Final   Staphylococcus aureus (BCID) NOT DETECTED NOT DETECTED Final   Staphylococcus epidermidis NOT DETECTED NOT DETECTED Final   Staphylococcus lugdunensis NOT DETECTED NOT DETECTED Final   Streptococcus species NOT DETECTED NOT DETECTED Final   Streptococcus agalactiae NOT DETECTED NOT DETECTED Final   Streptococcus pneumoniae NOT DETECTED NOT DETECTED Final   Streptococcus pyogenes NOT DETECTED NOT DETECTED Final   A.calcoaceticus-baumannii NOT DETECTED NOT DETECTED Final   Bacteroides fragilis NOT DETECTED NOT DETECTED Final   Enterobacterales NOT DETECTED NOT DETECTED Final   Enterobacter cloacae complex NOT DETECTED NOT DETECTED Final   Escherichia coli NOT DETECTED NOT DETECTED Final   Klebsiella aerogenes NOT DETECTED NOT DETECTED Final   Klebsiella oxytoca NOT DETECTED NOT  DETECTED Final   Klebsiella pneumoniae NOT DETECTED NOT DETECTED Final   Proteus species NOT DETECTED NOT DETECTED Final   Salmonella species NOT DETECTED NOT DETECTED Final   Serratia marcescens NOT DETECTED NOT DETECTED Final   Haemophilus influenzae NOT DETECTED NOT DETECTED Final   Neisseria meningitidis NOT DETECTED NOT DETECTED Final   Pseudomonas aeruginosa DETECTED (A) NOT DETECTED Final    Comment: CRITICAL RESULT CALLED TO, READ BACK BY AND VERIFIED WITH: PHARMD A  ELLINGTON [id number] FCP    Stenotrophomonas maltophilia NOT DETECTED NOT DETECTED Final   Candida albicans NOT DETECTED NOT DETECTED Final   Candida auris NOT DETECTED NOT DETECTED Final   Candida glabrata NOT DETECTED NOT DETECTED Final   Candida krusei NOT DETECTED NOT DETECTED Final   Candida parapsilosis NOT DETECTED NOT DETECTED Final   Candida tropicalis NOT DETECTED NOT DETECTED Final   Cryptococcus neoformans/gattii NOT DETECTED NOT DETECTED Final   CTX-M ESBL NOT DETECTED NOT DETECTED Final   Carbapenem resistance IMP NOT DETECTED NOT DETECTED Final   Carbapenem resistance KPC NOT DETECTED NOT DETECTED Final   Carbapenem resistance NDM NOT DETECTED NOT DETECTED Final   Carbapenem resistance VIM NOT DETECTED NOT DETECTED Final    Comment: Performed at [facility], [place] [address], [place], [place] [address]  Urine Culture     Status: Abnormal   Collection Time: 08/12/21  4:25 PM   Specimen: Urine, Clean Catch  Result Value Ref Range Status   Specimen Description URINE, CLEAN CATCH  Final   Special Requests NONE  Final   Culture >=100,000 COLONIES/mL PSEUDOMONAS AERUGINOSA (A)  Final   Report Status 08/14/2021 FINAL  Final   Organism ID, Bacteria PSEUDOMONAS AERUGINOSA (A)  Final      Susceptibility   Pseudomonas aeruginosa - MIC*    CEFTAZIDIME 4 SENSITIVE Sensitive     CIPROFLOXACIN 0.5 SENSITIVE Sensitive     GENTAMICIN 4 SENSITIVE Sensitive     IMIPENEM 2 SENSITIVE Sensitive     CEFEPIME  Value in next row Sensitive      SENSITIVE8Performed at [facility] [address], [place], [place] [address]    * >=100,000 COLONIES/mL PSEUDOMONAS AERUGINOSA  Resp Panel by RT-PCR (Flu A&B, Covid) Nasopharyngeal Swab     Status: None   Collection Time: 08/12/21  4:25 PM   Specimen: Nasopharyngeal Swab; Nasopharyngeal(NP) swabs in vial transport medium  Result Value Ref Range Status   SARS Coronavirus 2 by RT PCR NEGATIVE NEGATIVE Final    Comment: (NOTE) SARS-CoV-2 target nucleic acids are NOT DETECTED.  The SARS-CoV-2 RNA is generally detectable in upper respiratory specimens during the acute phase of infection. The lowest concentration of SARS-CoV-2 viral copies this assay can detect is 138 copies/mL. A negative result does not preclude SARS-Cov-2 infection and should not be used as the sole basis for treatment or other patient management decisions. A negative result may occur with  improper specimen collection/handling, submission of specimen other than nasopharyngeal swab, presence of viral mutation(s) within the areas targeted by this assay, and inadequate number of viral copies(<138 copies/mL). A negative result must be combined with clinical observations, patient history, and epidemiological information. The expected result is Negative.  Fact Sheet for Patients:  EntrepreneurPulse.com.au  Fact Sheet for Healthcare Providers:  IncredibleEmployment.be  This test is no t yet approved or cleared by the [place] FDA and  has been authorized for detection and/or diagnosis of SARS-CoV-2 by FDA under an Emergency Use Authorization (EUA). This EUA will remain  in effect (meaning this test can be used) for the duration of the COVID-19 declaration under Section 564(b)(1) of the Act, 21 U.S.C.section 360bbb-3(b)(1), unless the authorization is terminated  or revoked sooner.       Influenza A by PCR NEGATIVE NEGATIVE Final    Influenza B by PCR NEGATIVE NEGATIVE Final    Comment: (NOTE) The Xpert Xpress SARS-CoV-2/FLU/RSV plus assay is intended as an aid in the diagnosis of influenza  from Nasopharyngeal swab specimens and should not be used as a sole basis for treatment. Nasal washings and aspirates are unacceptable for Xpert Xpress SARS-CoV-2/FLU/RSV testing.  Fact Sheet for Patients: EntrepreneurPulse.com.au  Fact Sheet for Healthcare Providers: IncredibleEmployment.be  This test is not yet approved or cleared by the [place] FDA and has been authorized for detection and/or diagnosis of SARS-CoV-2 by FDA under an Emergency Use Authorization (EUA). This EUA will remain in effect (meaning this test can be used) for the duration of the COVID-19 declaration under Section 564(b)(1) of the Act, 21 U.S.C. section 360bbb-3(b)(1), unless the authorization is terminated or revoked.  Performed at [facility], [place] [address], [place], [place] [address]   MRSA Next Gen by PCR, Nasal     Status: None   Collection Time: 08/13/21  6:36 AM   Specimen: Nasal Mucosa; Nasal Swab  Result Value Ref Range Status   MRSA by PCR Next Gen NOT DETECTED NOT DETECTED Final    Comment: (NOTE) The GeneXpert MRSA Assay (FDA approved for NASAL specimens only), is one component of a comprehensive MRSA colonization surveillance program. It is not intended to diagnose MRSA infection nor to guide or monitor treatment for MRSA infections. Test performance is not FDA approved in patients less than 9 years old. Performed at [facility], [place] [address] [place], [place] [address]      Radiology Studies: No results found.   Marzetta Board, MD, PhD [facility]  Between 7 am - 7 pm I am available, please contact me via Amion (for emergencies) or Securechat (non urgent messages)  Between 7 pm - 7 am I am not available, please contact night  coverage MD/APP via Amion

## 2021-08-17 NOTE — Progress Notes (Signed)
  Subjective: Denies pain. No nausea or emesis. Afebrile.  Objective: Vital signs in last 24 hours: Temp:  [98 F (36.7 C)-98.3 F (36.8 C)] 98.3 F (36.8 C) (05/28 0453) Pulse Rate:  [85-97] 85 (05/28 0453) Resp:  [17-20] 17 (05/28 0453) BP: (146-154)/(83-93) 153/83 (05/28 1010) SpO2:  [96 %-98 %] 97 % (05/28 0745)  Intake/Output from previous day: 05/27 0701 - 05/28 0700 In: 360 [P.O.:360] Out: 2350 [Urine:2350] Intake/Output this shift: No intake/output data recorded.  UOP: 2.3L clear yellow  Physical Exam:  General: Alert and oriented CV: RRR Lungs: Clear Abdomen: Soft, ND, NT Ext: NT, No erythema  Lab Results: Recent Labs    08/15/21 0524 08/16/21 0440 08/17/21 0433  HGB 9.6* 10.9* 10.8*  HCT 30.8* 33.4* 34.2*   BMET Recent Labs    08/16/21 0440 08/17/21 0433  NA 135 135  K 2.9* 3.6  CL 104 104  CO2 22 22  GLUCOSE 104* 85  BUN 15 15  CREATININE 1.05 1.09  CALCIUM 8.3* 8.4*     Studies/Results: No results found.  Assessment/Plan: 1.         Sepsis due to UTI after right ureteroscopy with laser lithotripsy for right sided stones   -He is doing well after left nephrostomy tube removed and right ureteral stent removed. -Keep indwelling Foley catheter and will have him void trial outpatient. -Overall, white blood cell count has been downtrending and is 16.9 today. -He continues on IV antibiotics, discharged home on a course of p.o. antibiotics. -We will follow-up outpatient.  Please call with questions.   LOS: 5 days   Matt R. Nethra Mehlberg MD 08/17/2021, 10:23 AM [facility]  Pager: [phone]

## 2021-08-18 DIAGNOSIS — B182 Chronic viral hepatitis C: Secondary | ICD-10-CM | POA: Diagnosis not present

## 2021-08-18 DIAGNOSIS — R6520 Severe sepsis without septic shock: Secondary | ICD-10-CM | POA: Diagnosis not present

## 2021-08-18 DIAGNOSIS — A419 Sepsis, unspecified organism: Secondary | ICD-10-CM | POA: Diagnosis not present

## 2021-08-18 DIAGNOSIS — R7881 Bacteremia: Secondary | ICD-10-CM | POA: Diagnosis not present

## 2021-08-18 LAB — CBC
HCT: 33.6 % — ABNORMAL LOW (ref 39.0–52.0)
Hemoglobin: 11 g/dL — ABNORMAL LOW (ref 13.0–17.0)
MCH: 26.6 pg (ref 26.0–34.0)
MCHC: 32.7 g/dL (ref 30.0–36.0)
MCV: 81.4 fL (ref 80.0–100.0)
Platelets: 311 10*3/uL (ref 150–400)
RBC: 4.13 MIL/uL — ABNORMAL LOW (ref 4.22–5.81)
RDW: 15.9 % — ABNORMAL HIGH (ref 11.5–15.5)
WBC: 18.6 10*3/uL — ABNORMAL HIGH (ref 4.0–10.5)
nRBC: 0 % (ref 0.0–0.2)

## 2021-08-18 LAB — BASIC METABOLIC PANEL
Anion gap: 6 (ref 5–15)
BUN: 16 mg/dL (ref 8–23)
CO2: 26 mmol/L (ref 22–32)
Calcium: 8.9 mg/dL (ref 8.9–10.3)
Chloride: 106 mmol/L (ref 98–111)
Creatinine, Ser: 1.1 mg/dL (ref 0.61–1.24)
GFR, Estimated: >60 mL/min (ref >60)
Glucose, Bld: 101 mg/dL — ABNORMAL HIGH (ref 70–99)
Potassium: 3.9 mmol/L (ref 3.5–5.1)
Sodium: 138 mmol/L (ref 135–145)

## 2021-08-18 NOTE — TOC Progression Note (Signed)
Transition of Care [facility]) - Progression Note    Patient Details  Name: Ian Boyd MRN: [id number] Date of Birth: 1950-10-05  [facility] CM/SW Contact  Shylo Dillenbeck, Juliann Pulse, RN Phone Number: 08/18/2021, 1:35 PM  Clinical Narrative:Awaiting medical stability for return to [facility].       Expected Discharge Plan: Long Term Nursing Home Barriers to Discharge: Continued Medical Work up  Expected Discharge Plan and Services Expected Discharge Plan: [facility]   Discharge Planning Services: CM Consult   Living arrangements for the past 2 months: [place]                                       Social Determinants of Health (SDOH) Interventions    Readmission Risk Interventions     View : No data to display.

## 2021-08-18 NOTE — Progress Notes (Signed)
Ian Boyd WUJ:[id number] DOB: [birth year] DOA: 08/12/2021 PCP: Curlene Labrum, MD   LOS: 6 days   Brief Narrative / Interim history: 71 y.o. male with medical history significant of hypertension, recent psoas abscess, COPD, nephrolithiasis, comes [facility] the hospital [facility] the [facility] due to fever and tachycardia.  He was recently hospitalized in April 2023 with E. 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  He had a drain placed at that time, and ID followed patient while hospitalized.  Eventually improved, follow-up with ID as an outpatient about 3 weeks ago, and was supposed to stay on cefadroxil until 5/25.  He underwent treatment for his nephrolithiasis 5/22 with cystoscopy, right ureteroscopy, stone extraction and right ureteral stent placement.  24 hours afterwards developed a fever, tachycardia, and was brought to the hospital.  He was found to have Pseudomonas bacteremia as well as Pseudomonas UTI.  Urology and ID consulted.  Subjective / 24h Interval events: He is feeling well this morning.  No nausea or vomiting.  Eating breakfast.  Feels stronger  Assesement and Plan: Principal Problem:   Severe sepsis without septic shock ([place]) Active Problems:   Psoas abscess, right ([facility])   Hydronephrosis of left kidney   AKI (acute kidney injury) ([facility])   Urinary tract infection   Nephrolithiasis   Hepatitis C   Bacteremia due to Pseudomonas  Principal problem Sepsis due to Pseudomonas UTI and Pseudomonas bacteremia-patient presents 24 hours after instrumentation with elevated WBC, fever, tachycardia and pyuria.  Blood cultures and urine cultures were sent, speciated Pseudomonas.  ID consulted.  Continue meropenem for now, when clinically improving white count gets better could potentially be transitioned to Cipro for total of 14 days. Dose on dc will be 500 mg po q8h or 750 mg po q12h per pharmacy, due to  pseudomonas bacteremia -CT scan of the abdomen and pelvis raised suspicion for diverticulitis however he has no abdominal pain and his exam is unremarkable.  -His white count climbing today after removal of his ureteral stent and percutaneous nephrostomy.  Discussed with Dr. Abner Greenspan this morning, continue to monitor.  He does not recommend repeat imaging at this point.   Active problems Nephrolithiasis/bladder stone, left kidney hydronephrosis-patient just underwent cystoscopy with the prior to admission with laser lithotripsy on the right and extraction of stones, he also had a right ureteral stent placement.  Also underwent a cystoscopy which showed bladder diverticuli.  He also has a left percutaneous nephrostomy tube.  Urology consulted, L PCN tube removed on 5/26 along with a right ureteral stent, also Foley catheter replaced on 5/26 also.  Since then, WBC increasing, see discussion above.  Low threshold for reimaging but for now urology wants to hold and watch WBC trend  Hypokalemia-due to Lasix, potassium repleted and now normalized.  Magnesium 1.9.  Essential hypertension-continue amlodipine   Acute kidney injury-due to sepsis.  Creatinine remains normal at this morning   Urinary retention-continue home medications.  Has a Foley in place   COPD-stable, no wheezing, continue home inhalers   Recent psoas abscess, recent E. coli bacteremia during prior hospital stay-was already on antibiotics as an outpatient for 2 additional days with plan to end on 5/25.  This issue seems to be resolved.  Currently on meropenem for the problems above   Right rotator cuff tear-chronic, has been going on for the past 3 years  History of hep C-ID will follow as  an outpatient  Scheduled Meds:  amLODipine  5 mg Oral Daily   Chlorhexidine Gluconate Cloth  6 each Topical Daily   dorzolamide-timolol  1 drop Left Eye BID   feeding supplement  237 mL Oral BID BM   heparin  5,000 Units Subcutaneous Q8H    latanoprost  1 drop Left Eye QHS   multivitamin with minerals  1 tablet Oral Daily   Netarsudil Dimesylate  1 drop Left Eye QHS   tamsulosin  0.4 mg Oral QHS   umeclidinium bromide  1 puff Inhalation Daily   Continuous Infusions:  meropenem (MERREM) IV 1 g (08/18/21 0026)   PRN Meds:.acetaminophen **OR** acetaminophen, docusate sodium, guaiFENesin-dextromethorphan, hydrALAZINE, ipratropium-albuterol, lip balm  Diet Orders (From admission, onward)     Start     Ordered   08/12/21 2115  Diet Heart Room service appropriate? Yes; Fluid consistency: Thin  Diet effective now       Question Answer Comment  Room service appropriate? Yes   Fluid consistency: Thin      08/12/21 2115            DVT prophylaxis: heparin injection 5,000 Units Start: 08/12/21 2200   Lab Results  Component Value Date   PLT 311 08/18/2021      Code Status: Full Code  Family Communication: no family at bedside, updated sister over the phone  Status is: Inpatient  Remains inpatient appropriate because: Slightly elevation in his WBC  Level of care: Progressive  Consultants:  Urology   Procedures:  none  Microbiology  Urine cultures - pseudomonas Blood cultures-Pseudomonas  Objective: Vitals:   08/17/21 1405 08/17/21 1917 08/18/21 0337 08/18/21 0831  BP: (!) 151/88 (!) 160/86 (!) 159/93   Pulse: 89 92 86   Resp: '18 14 14   '$ Temp: 99.4 F (37.4 C) 98.7 F (37.1 C) 98.6 F (37 C)   TempSrc: Oral Oral Oral   SpO2: 98% 99% 97% 97%  Weight:      Height:        Intake/Output Summary (Last 24 hours) at 08/18/2021 0843 Last data filed at 08/18/2021 1610 Gross per 24 hour  Intake 880 ml  Output 2850 ml  Net -1970 ml    Wt Readings from Last 3 Encounters:  08/12/21 63.6 kg  08/11/21 57.6 kg  06/29/21 57.3 kg    Examination: Constitutional: NAD Eyes: lids and conjunctivae normal, no scleral icterus ENMT: mmm Neck: normal, supple Respiratory: clear to auscultation bilaterally,  no wheezing, no crackles. Normal respiratory effort.  Cardiovascular: Regular rate and rhythm, no murmurs / rubs / gallops. No LE edema. Abdomen: soft, no distention, no tenderness. Bowel sounds positive.  Skin: no rashes Neurologic: no focal deficits, equal strength  Data Reviewed: I have independently reviewed following labs and imaging studies  CBC Recent Labs  Lab 08/12/21 1552 08/13/21 0105 08/14/21 0449 08/15/21 0524 08/16/21 0440 08/17/21 0433 08/18/21 0356  WBC 26.7*   < > 23.2* 17.1* 14.4* 16.9* 18.6*  HGB 11.0*   < > 9.7* 9.6* 10.9* 10.8* 11.0*  HCT 34.5*   < > 30.8* 30.8* 33.4* 34.2* 33.6*  PLT 320   < > 217 226 232 257 311  MCV 85.2   < > 84.6 84.2 80.7 82.0 81.4  MCH 27.2   < > 26.6 26.2 26.3 25.9* 26.6  MCHC 31.9   < > 31.5 31.2 32.6 31.6 32.7  RDW 15.9*   < > 16.0* 15.8* 15.7* 15.9* 15.9*  LYMPHSABS 1.0  --   --   --   --   --   --  MONOABS 2.0*  --   --   --   --   --   --   EOSABS 0.0  --   --   --   --   --   --   BASOSABS 0.0  --   --   --   --   --   --    < > = values in this interval not displayed.     Recent Labs  Lab 08/11/21 1158 08/11/21 1158 08/12/21 1552 08/12/21 1601 08/12/21 1838 08/12/21 2232 08/13/21 0105 08/13/21 0436 08/13/21 9528 08/14/21 0449 08/15/21 0524 08/16/21 0440 08/17/21 0433 08/18/21 0356  NA 139  --  137  --   --   --  138  --   --  140 139 135 135 138  K 4.1  --  4.1  --   --   --  4.1  --   --  3.6 3.6 2.9* 3.6 3.9  CL 107  --  105  --   --   --  108  --   --  116* 114* 104 104 106  CO2 22  --  22  --   --   --  22  --   --  21* 19* '22 22 26  '$ GLUCOSE 93  --  119*  --   --   --  99  --   --  93 101* 104* 85 101*  BUN 12  --  20  --   --   --  21  --   --  '22 21 15 15 16  '$ CREATININE 1.15  --  1.79*  --   --   --  1.75*  --   --  1.43* 1.16 1.05 1.09 1.10  CALCIUM 9.2  --  8.4*  --   --   --  8.2*  --   --  8.0* 8.0* 8.3* 8.4* 8.9  AST 50*  --  54*  --   --   --  51*  --   --  24  --   --   --   --   ALT 41  --   39  --   --   --  38  --   --  23  --   --   --   --   ALKPHOS 102  --  83  --   --   --  70  --   --  60  --   --   --   --   BILITOT 0.6  --  0.7  --   --   --  1.0  --   --  0.6  --   --   --   --   ALBUMIN 3.3*  --  2.8*  --   --   --  2.5*  --   --  2.2*  --   --   --   --   MG  --   --   --   --   --   --   --  1.3*  --  2.1 2.0  --  1.9  --   LATICACIDVEN  --    < > 4.3*  --  3.4* 4.0* 2.6* 2.0* 2.3*  --   --   --   --   --   INR  --   --   --  1.2  --   --   --   --   --   --   --   --   --   --    < > =  values in this interval not displayed.     ------------------------------------------------------------------------------------------------------------------ No results for input(s): CHOL, HDL, LDLCALC, TRIG, CHOLHDL, LDLDIRECT in the last 72 hours.  No results found for: HGBA1C ------------------------------------------------------------------------------------------------------------------ No results for input(s): TSH, T4TOTAL, T3FREE, THYROIDAB in the last 72 hours.  Invalid input(s): FREET3  Cardiac Enzymes No results for input(s): CKMB, TROPONINI, MYOGLOBIN in the last 168 hours.  Invalid input(s): CK ------------------------------------------------------------------------------------------------------------------ No results found for: BNP  CBG: No results for input(s): GLUCAP in the last 168 hours.  Recent Results (from the past 240 hour(s))  Blood culture (routine x 2)     Status: Abnormal   Collection Time: 08/12/21  3:52 PM   Specimen: BLOOD  Result Value Ref Range Status   Specimen Description BLOOD LEFT ANTECUBITAL  Final   Special Requests   Final    BOTTLES DRAWN AEROBIC AND ANAEROBIC Blood Culture adequate volume   Culture  Setup Time   Final    GRAM NEGATIVE RODS IN BOTH AEROBIC AND ANAEROBIC BOTTLES CRITICAL RESULT CALLED TO, READ BACK BY AND VERIFIED WITH: PHARMD A ELLINGTON [phone] FCP    Culture PSEUDOMONAS AERUGINOSA (A)  Final   Report  Status 08/15/2021 FINAL  Final   Organism ID, Bacteria PSEUDOMONAS AERUGINOSA  Final      Susceptibility   Pseudomonas aeruginosa - MIC*    CEFTAZIDIME 8 SENSITIVE Sensitive     CIPROFLOXACIN <=0.25 SENSITIVE Sensitive     GENTAMICIN 2 SENSITIVE Sensitive     IMIPENEM 2 SENSITIVE Sensitive     CEFEPIME Value in next row Resistant      RESISTANT16Performed at [facility] [address] [place], [place] [address]    * PSEUDOMONAS AERUGINOSA  Blood culture (routine x 2)     Status: Abnormal   Collection Time: 08/12/21  3:52 PM   Specimen: BLOOD  Result Value Ref Range Status   Specimen Description   Final    BLOOD LEFT ANTECUBITAL Performed at [facility] [address], [place], [place] [address]    Special Requests   Final    BOTTLES DRAWN AEROBIC AND ANAEROBIC Blood Culture adequate volume Performed at [facility] [address], [place], [place] [address]    Culture  Setup Time   Final    GRAM NEGATIVE RODS AEROBIC BOTTLE ONLY CRITICAL VALUE NOTED.  VALUE IS CONSISTENT WITH PREVIOUSLY REPORTED AND CALLED VALUE.    Culture (A)  Final    PSEUDOMONAS AERUGINOSA SUSCEPTIBILITIES PERFORMED ON PREVIOUS CULTURE WITHIN THE LAST 5 DAYS. Performed at [facility], [place] [address] [place], [place] [address]    Report Status 08/15/2021 FINAL  Final  Blood Culture ID Panel (Reflexed)     Status: Abnormal   Collection Time: 08/12/21  3:52 PM  Result Value Ref Range Status   Enterococcus faecalis NOT DETECTED NOT DETECTED Final   Enterococcus Faecium NOT DETECTED NOT DETECTED Final   Listeria monocytogenes NOT DETECTED NOT DETECTED Final   Staphylococcus species NOT DETECTED NOT DETECTED Final   Staphylococcus aureus (BCID) NOT DETECTED NOT DETECTED Final   Staphylococcus epidermidis NOT DETECTED NOT DETECTED Final   Staphylococcus lugdunensis NOT DETECTED NOT DETECTED Final   Streptococcus species NOT DETECTED NOT DETECTED Final    Streptococcus agalactiae NOT DETECTED NOT DETECTED Final   Streptococcus pneumoniae NOT DETECTED NOT DETECTED Final   Streptococcus pyogenes NOT DETECTED NOT DETECTED Final   A.calcoaceticus-baumannii NOT DETECTED NOT DETECTED Final   Bacteroides fragilis NOT DETECTED  NOT DETECTED Final   Enterobacterales NOT DETECTED NOT DETECTED Final   Enterobacter cloacae complex NOT DETECTED NOT DETECTED Final   Escherichia coli NOT DETECTED NOT DETECTED Final   Klebsiella aerogenes NOT DETECTED NOT DETECTED Final   Klebsiella oxytoca NOT DETECTED NOT DETECTED Final   Klebsiella pneumoniae NOT DETECTED NOT DETECTED Final   Proteus species NOT DETECTED NOT DETECTED Final   Salmonella species NOT DETECTED NOT DETECTED Final   Serratia marcescens NOT DETECTED NOT DETECTED Final   Haemophilus influenzae NOT DETECTED NOT DETECTED Final   Neisseria meningitidis NOT DETECTED NOT DETECTED Final   Pseudomonas aeruginosa DETECTED (A) NOT DETECTED Final    Comment: CRITICAL RESULT CALLED TO, READ BACK BY AND VERIFIED WITH: PHARMD A ELLINGTON [phone] FCP    Stenotrophomonas maltophilia NOT DETECTED NOT DETECTED Final   Candida albicans NOT DETECTED NOT DETECTED Final   Candida auris NOT DETECTED NOT DETECTED Final   Candida glabrata NOT DETECTED NOT DETECTED Final   Candida krusei NOT DETECTED NOT DETECTED Final   Candida parapsilosis NOT DETECTED NOT DETECTED Final   Candida tropicalis NOT DETECTED NOT DETECTED Final   Cryptococcus neoformans/gattii NOT DETECTED NOT DETECTED Final   CTX-M ESBL NOT DETECTED NOT DETECTED Final   Carbapenem resistance IMP NOT DETECTED NOT DETECTED Final   Carbapenem resistance KPC NOT DETECTED NOT DETECTED Final   Carbapenem resistance NDM NOT DETECTED NOT DETECTED Final   Carbapenem resistance VIM NOT DETECTED NOT DETECTED Final    Comment: Performed at [facility], [address] [place], [place] [address]  Urine Culture     Status: Abnormal   Collection  Time: 08/12/21  4:25 PM   Specimen: Urine, Clean Catch  Result Value Ref Range Status   Specimen Description URINE, CLEAN CATCH  Final   Special Requests NONE  Final   Culture >=100,000 COLONIES/mL PSEUDOMONAS AERUGINOSA (A)  Final   Report Status 08/14/2021 FINAL  Final   Organism ID, Bacteria PSEUDOMONAS AERUGINOSA (A)  Final      Susceptibility   Pseudomonas aeruginosa - MIC*    CEFTAZIDIME 4 SENSITIVE Sensitive     CIPROFLOXACIN 0.5 SENSITIVE Sensitive     GENTAMICIN 4 SENSITIVE Sensitive     IMIPENEM 2 SENSITIVE Sensitive     CEFEPIME Value in next row Sensitive      SENSITIVE8Performed at [facility] [address] [place], [place] [address]    * >=100,000 COLONIES/mL PSEUDOMONAS AERUGINOSA  Resp Panel by RT-PCR (Flu A&B, Covid) Nasopharyngeal Swab     Status: None   Collection Time: 08/12/21  4:25 PM   Specimen: Nasopharyngeal Swab; Nasopharyngeal(NP) swabs in vial transport medium  Result Value Ref Range Status   SARS Coronavirus 2 by RT PCR NEGATIVE NEGATIVE Final    Comment: (NOTE) SARS-CoV-2 target nucleic acids are NOT DETECTED.  The SARS-CoV-2 RNA is generally detectable in upper respiratory specimens during the acute phase of infection. The lowest concentration of SARS-CoV-2 viral copies this assay can detect is 138 copies/mL. A negative result does not preclude SARS-Cov-2 infection and should not be used as the sole basis for treatment or other patient management decisions. A negative result may occur with  improper specimen collection/handling, submission of specimen other than nasopharyngeal swab, presence of viral mutation(s) within the areas targeted by this assay, and inadequate number of viral copies(<138 copies/mL). A negative result must be combined with clinical observations, patient history, and epidemiological information. The expected result is Negative.  Fact Sheet for  Patients:  EntrepreneurPulse.com.au  Fact Sheet for  Healthcare Providers:  IncredibleEmployment.be  This test is no t yet approved or cleared by the [place] FDA and  has been authorized for detection and/or diagnosis of SARS-CoV-2 by FDA under an Emergency Use Authorization (EUA). This EUA will remain  in effect (meaning this test can be used) for the duration of the COVID-19 declaration under Section 564(b)(1) of the Act, 21 U.S.C.section 360bbb-3(b)(1), unless the authorization is terminated  or revoked sooner.       Influenza A by PCR NEGATIVE NEGATIVE Final   Influenza B by PCR NEGATIVE NEGATIVE Final    Comment: (NOTE) The Xpert Xpress SARS-CoV-2/FLU/RSV plus assay is intended as an aid in the diagnosis of influenza from Nasopharyngeal swab specimens and should not be used as a sole basis for treatment. Nasal washings and aspirates are unacceptable for Xpert Xpress SARS-CoV-2/FLU/RSV testing.  Fact Sheet for Patients: EntrepreneurPulse.com.au  Fact Sheet for Healthcare Providers: IncredibleEmployment.be  This test is not yet approved or cleared by the [place] FDA and has been authorized for detection and/or diagnosis of SARS-CoV-2 by FDA under an Emergency Use Authorization (EUA). This EUA will remain in effect (meaning this test can be used) for the duration of the COVID-19 declaration under Section 564(b)(1) of the Act, 21 U.S.C. section 360bbb-3(b)(1), unless the authorization is terminated or revoked.  Performed at [facility], [place] [address] [place], [place] [address]   MRSA Next Gen by PCR, Nasal     Status: None   Collection Time: 08/13/21  6:36 AM   Specimen: Nasal Mucosa; Nasal Swab  Result Value Ref Range Status   MRSA by PCR Next Gen NOT DETECTED NOT DETECTED Final    Comment: (NOTE) The GeneXpert MRSA Assay (FDA approved for NASAL specimens only), is one component of a comprehensive MRSA colonization  surveillance program. It is not intended to diagnose MRSA infection nor to guide or monitor treatment for MRSA infections. Test performance is not FDA approved in patients less than 21 years old. Performed at [facility], [place] [address], [place], [place] [address]      Radiology Studies: No results found.   Marzetta Board, MD, PhD [facility]  Between 7 am - 7 pm I am available, please contact me via Amion (for emergencies) or Securechat (non urgent messages)  Between 7 pm - 7 am I am not available, please contact night coverage MD/APP via Amion

## 2021-08-18 NOTE — Progress Notes (Signed)
  Subjective: Denies pain. No nausea or emesis. Afebrile.  Objective: Vital signs in last 24 hours: Temp:  [98.6 F (37 C)-99.4 F (37.4 C)] 98.6 F (37 C) (05/29 0337) Pulse Rate:  [86-92] 86 (05/29 0337) Resp:  [14-18] 14 (05/29 0337) BP: (142-160)/(86-93) 142/86 (05/29 0949) SpO2:  [97 %-99 %] 97 % (05/29 0831)  Intake/Output from previous day: 05/28 0701 - 05/29 0700 In: 640 [P.O.:240; IV Piggyback:400] Out: 2850 [Urine:2850] Intake/Output this shift: Total I/O In: 240 [P.O.:240] Out: 650 [Urine:650]  UOP: 2.8L clear yellow  Physical Exam:  General: Alert and oriented CV: RRR Lungs: Clear Abdomen: Soft, ND, NT Ext: NT, No erythema  Lab Results: Recent Labs    08/16/21 0440 08/17/21 0433 08/18/21 0356  HGB 10.9* 10.8* 11.0*  HCT 33.4* 34.2* 33.6*   BMET Recent Labs    08/17/21 0433 08/18/21 0356  NA 135 138  K 3.6 3.9  CL 104 106  CO2 22 26  GLUCOSE 85 101*  BUN 15 16  CREATININE 1.09 1.10  CALCIUM 8.4* 8.9     Studies/Results: No results found.  Assessment/Plan: 1.         Sepsis due to UTI after right ureteroscopy with laser lithotripsy for right sided stones   -He is doing well after left nephrostomy tube removed and right ureteral stent removed. -Keep indwelling Foley catheter and will have him void trial outpatient. -WBC up today, but afebrile, asymptomatic -Continue IV abx. Would not plan to rescan unless develops fevers or symptoms.   LOS: 6 days   Matt R. Ashya Nicolaisen MD 08/18/2021, 10:16 AM [facility]  Pager: [phone]

## 2021-08-18 NOTE — Progress Notes (Signed)
Physical Therapy Treatment Patient Details Name: Ian Boyd MRN: [id number] DOB: Mar 14, 1951 Today's Date: 08/18/2021   History of Present Illness Patient is 71 y.o. male recently hospitalized in April 2023 with E. 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  He had a drain placed at that time, and ID followed patient while hospitalized.  Eventually improved, follow-up with ID as an outpatient about 3 weeks ago, and was supposed to stay on cefadroxil until 5/25.  He underwent treatment for his nephrolithiasis 5/22 with cystoscopy, right ureteroscopy, stone extraction and right ureteral stent placement.  24 hours afterwards developed a fever, tachycardia, and was brought to the hospital.  He was found to have Pseudomonas bacteremia as well as Pseudomonas UTI. PMH significant of hypertension, recent psoas abscess, COPD, nephrolithiasis, comes into the hospital from the SNF due to fever and tachycardia.    PT Comments    Pt is amb hallway distance ~ mod I to sueprvision. Pt states he is planning to return to [facility], chart states LTC. Pt preferred to use RW today for amb, will see again to review/progress gait as pt was not reliant on RW last session   Recommendations for follow up therapy are one component of a multi-disciplinary discharge planning process, led by the attending physician.  Recommendations may be updated based on patient status, additional functional criteria and insurance authorization.  Follow Up Recommendations  No PT follow up     Assistance Recommended at Discharge PRN  Patient can return home with the following     Equipment Recommendations  None recommended by PT    Recommendations for Other Services       Precautions / Restrictions Precautions Precautions: Fall Precaution Comments: Rt RC torn, pt unable to use Rt shoulder bu thas use of elbow, wrist, hand Restrictions Weight Bearing  Restrictions: No     Mobility  Bed Mobility Overal bed mobility: Needs Assistance Bed Mobility: Supine to Sit, Sit to Supine     Supine to sit: Modified independent (Device/Increase time) Sit to supine: Modified independent (Device/Increase time)        Transfers Overall transfer level: Modified independent Equipment used: None Transfers: Sit to/from Stand Sit to Stand: Modified independent (Device/Increase time)                Ambulation/Gait Ambulation/Gait assistance: Supervision, Modified independent (Device/Increase time) Gait Distance (Feet): 360 Feet Assistive device: Rolling walker (2 wheels) Gait Pattern/deviations: Step-through pattern       General Gait Details: steady gait, pt preferred to use RW today for stability. no LOB.   Stairs             Wheelchair Mobility    Modified Rankin (Stroke Patients Only)       Balance                                            Cognition Arousal/Alertness: Awake/alert Behavior During Therapy: WFL for tasks assessed/performed Overall Cognitive Status: Within Functional Limits for tasks assessed                                          Exercises      General Comments  Pertinent Vitals/Pain Pain Assessment Pain Assessment: Faces Faces Pain Scale: Hurts a little bit Pain Location: Rt shoulder with movement Pain Descriptors / Indicators: Discomfort, Grimacing Pain Intervention(s): Monitored during session    Home Living                          Prior Function            PT Goals (current goals can now be found in the care plan section) Acute Rehab PT Goals Patient Stated Goal: to get home as he reports he is paying OOP for the nursing facility PT Goal Formulation: With patient Time For Goal Achievement: 08/29/21 Potential to Achieve Goals: Good Progress towards PT goals: Progressing toward goals    Frequency    Min  2X/week      PT Plan Current plan remains appropriate    Co-evaluation              AM-PAC PT "6 Clicks" Mobility   Outcome Measure  Help needed turning from your back to your side while in a flat bed without using bedrails?: None Help needed moving from lying on your back to sitting on the side of a flat bed without using bedrails?: None Help needed moving to and from a bed to a chair (including a wheelchair)?: None Help needed standing up from a chair using your arms (e.g., wheelchair or bedside chair)?: None Help needed to walk in hospital room?: None Help needed climbing 3-5 steps with a railing? : A Little 6 Click Score: 23    End of Session Equipment Utilized During Treatment: Gait belt Activity Tolerance: Patient tolerated treatment well Patient left: in bed;with call bell/phone within reach Nurse Communication: Mobility status PT Visit Diagnosis: Unsteadiness on feet (R26.81);Other abnormalities of gait and mobility (R26.89);Difficulty in walking, not elsewhere classified (R26.2)     Time: 6468-0321 PT Time Calculation (min) (ACUTE ONLY): 13 min  Charges:  $Gait Training: 8-22 mins                     Baxter Flattery, PT  Acute Rehab Dept ([place]) [phone] Pager [phone]  08/18/2021    [facility] 08/18/2021, 3:33 PM

## 2021-08-19 DIAGNOSIS — N1330 Unspecified hydronephrosis: Secondary | ICD-10-CM

## 2021-08-19 DIAGNOSIS — N179 Acute kidney failure, unspecified: Secondary | ICD-10-CM | POA: Diagnosis not present

## 2021-08-19 DIAGNOSIS — R7881 Bacteremia: Secondary | ICD-10-CM | POA: Diagnosis not present

## 2021-08-19 DIAGNOSIS — A419 Sepsis, unspecified organism: Secondary | ICD-10-CM | POA: Diagnosis not present

## 2021-08-19 DIAGNOSIS — B1920 Unspecified viral hepatitis C without hepatic coma: Secondary | ICD-10-CM | POA: Diagnosis not present

## 2021-08-19 DIAGNOSIS — N200 Calculus of kidney: Secondary | ICD-10-CM

## 2021-08-19 LAB — CBC
HCT: 36.1 % — ABNORMAL LOW (ref 39.0–52.0)
Hemoglobin: 11.3 g/dL — ABNORMAL LOW (ref 13.0–17.0)
MCH: 25.8 pg — ABNORMAL LOW (ref 26.0–34.0)
MCHC: 31.3 g/dL (ref 30.0–36.0)
MCV: 82.4 fL (ref 80.0–100.0)
Platelets: 375 10*3/uL (ref 150–400)
RBC: 4.38 MIL/uL (ref 4.22–5.81)
RDW: 16.3 % — ABNORMAL HIGH (ref 11.5–15.5)
WBC: 18.4 10*3/uL — ABNORMAL HIGH (ref 4.0–10.5)
nRBC: 0 % (ref 0.0–0.2)

## 2021-08-19 LAB — BASIC METABOLIC PANEL WITH GFR
Anion gap: 7 (ref 5–15)
BUN: 19 mg/dL (ref 8–23)
CO2: 26 mmol/L (ref 22–32)
Calcium: 8.9 mg/dL (ref 8.9–10.3)
Chloride: 104 mmol/L (ref 98–111)
Creatinine, Ser: 1.17 mg/dL (ref 0.61–1.24)
GFR, Estimated: >60 mL/min
Glucose, Bld: 99 mg/dL (ref 70–99)
Potassium: 5.1 mmol/L (ref 3.5–5.1)
Sodium: 137 mmol/L (ref 135–145)

## 2021-08-19 LAB — MAGNESIUM: Magnesium: 2.2 mg/dL (ref 1.7–2.4)

## 2021-08-19 NOTE — Progress Notes (Signed)
Lanetta Inch [phone]) from [facility] admission trying to get into contact with patients sister Bascom Levels. Called Vaughan Basta she said she is out of the state driving to a funeral and unable to sign papers Narda Rutherford is trying to request. Elberta Fortis (son) contacted and he is also driving and cannot write down [place] number. Called Janie back to tell her this information and asked per sons request if she can call the son herself.

## 2021-08-19 NOTE — Progress Notes (Signed)
  Subjective: Denies pain. No complaints. No nausea or emesis. Afebrile.  Objective: Vital signs in last 24 hours: Temp:  [97.9 F (36.6 C)-98.2 F (36.8 C)] 97.9 F (36.6 C) (05/30 0500) Pulse Rate:  [88-90] 88 (05/30 0500) Resp:  [16-22] 16 (05/30 0500) BP: (129-142)/(81-89) 129/81 (05/30 0500) SpO2:  [97 %-99 %] 99 % (05/30 0500)  Intake/Output from previous day: 05/29 0701 - 05/30 0700 In: 1260 [P.O.:960; IV Piggyback:300] Out: 3050 [Urine:3050] Intake/Output this shift: No intake/output data recorded.  UOP: 3L clear yellow  Physical Exam:  General: Alert and oriented CV: RRR Lungs: Clear Abdomen: Soft, ND, NT Ext: NT, No erythema  Lab Results: Recent Labs    08/17/21 0433 08/18/21 0356 08/19/21 0452  HGB 10.8* 11.0* 11.3*  HCT 34.2* 33.6* 36.1*   BMET Recent Labs    08/18/21 0356 08/19/21 0452  NA 138 137  K 3.9 5.1  CL 106 104  CO2 26 26  GLUCOSE 101* 99  BUN 16 19  CREATININE 1.10 1.17  CALCIUM 8.9 8.9     Studies/Results: No results found.  Assessment/Plan: 1.         Sepsis due to UTI after right ureteroscopy with laser lithotripsy for right sided stones   -He is doing well after left nephrostomy tube removed and right ureteral stent removed. -Keep indwelling Foley catheter and will have him void trial outpatient. -WBC stable.  -Likely discharge home today -Will arrange outpatient f/u next week for void trial   LOS: 7 days   Matt R. Tametria Aho MD 08/19/2021, 7:34 AM [facility]  Pager: [phone]

## 2021-08-19 NOTE — Progress Notes (Signed)
Nutrition Follow-up  DOCUMENTATION CODES:   Not applicable  INTERVENTION:  - continue Ensure BID. - weigh patient today.   NUTRITION DIAGNOSIS:   Increased nutrient needs related to acute illness as evidenced by estimated needs. -ongoing  GOAL:   Patient will meet greater than or equal to 90% of their needs -beginning to meet on average  MONITOR:   PO intake, Supplement acceptance, Labs, Weight trends  ASSESSMENT:   71 y.o. male with medical history of HTN, recent psoas abscess, COPD, nephrolithiasis, R eye blindness, acid reflux, hepatitis C, glaucoma, COPD, rotator cuff tear, and BPH. He presented to the ED from [facility] due to fever and tachycardia. He was hospitalized in 06/2021 due to E.coli bacteremia. He underwent treatment for nephrolithiasis on 08/11/21 with cystoscopy, R ureteroscopy, stone extraction, and ureteral stent placement. In the ED he was found to have bacteremia and UTI and was admitted for sepsis.  Meal intakes have been 40-100% over the past 5 days. Patient laying in bed watching TV. No visitors present at the time of RD visit this AM.   Patient shares that his appetite has been improving, that he ate a good breakfast, and that he had finished bottle of Ensure shortly before RD arrival (tossed empty bottle in the trash can for patient).  Review of Ensure order indicates he has been accepting this supplement ~75% of the time offered.  He has not been weighed since admission on 5/23. No information documented in the edema section of flow sheet this admission.  Urology note this AM indicates possible d/c later today.    Labs reviewed. Medications reviewed; 1 tablet multivitamin with minerals/day.   Diet Order:   Diet Order             Diet Heart Room service appropriate? Yes; Fluid consistency: Thin  Diet effective now                   EDUCATION NEEDS:   Education needs have been addressed  Skin:  Skin Assessment: Reviewed RN  Assessment  Last BM:  PTA/unknown  Height:   Ht Readings from Last 1 Encounters:  08/12/21 6' (1.829 m)    Weight:   Wt Readings from Last 1 Encounters:  08/12/21 63.6 kg     BMI:  Body mass index is 19.02 kg/m.  Estimated Nutritional Needs:  Kcal:  1910-2110 kcal Protein:  95-110 grams Fluid:  >/= 2.2 L/day     Jarome Matin, MS, RD, LDN Registered Dietitian II Inpatient Clinical Nutrition RD pager # and on-call/weekend pager # available in [facility]

## 2021-08-19 NOTE — Progress Notes (Signed)
PT Cancellation Note  Patient Details Name: Ian Boyd MRN: [id number] DOB: 1950/05/06   Cancelled Treatment:    Reason Eval/Treat Not Completed: Other (comment). [name] states he is too "tired" today.   [facility] 08/19/2021, 4:05 PM

## 2021-08-19 NOTE — Progress Notes (Signed)
PROGRESS NOTE    Ian Boyd  ZOX:[id number] DOB: September 09, 1950 DOA: 08/12/2021 PCP: Curlene Labrum, MD   Brief Narrative:  71 y.o. male with medical history significant of hypertension, recent psoas abscess, COPD, nephrolithiasis, comes into the hospital from the SNF due to fever and tachycardia.  He was recently hospitalized in April 2023 with E. coli bacteremia felt to be due to UTI, also found to have nephrolithiasis, and a psoas abscess.  He had a drain placed at that time, and ID followed patient while hospitalized.  Eventually improved; he followed-up with ID as an outpatient about 3 weeks ago, and was supposed to stay on cefadroxil until 5/25.  He underwent treatment for his nephrolithiasis 5/22 with cystoscopy, right ureteroscopy, stone extraction and right ureteral stent placement.  24 hours afterwards developed a fever, tachycardia, and was brought to the hospital.  He was found to have Pseudomonas bacteremia as well as Pseudomonas UTI.  Urology and ID consulted.  He had left percutaneous nephrostomy tube removal and right ureteric stent placement along with Foley catheter replacement on 08/15/2021 by urology.  Assessment & Plan:   Sepsis: Present on admission Pseudomonas UTI and bacteremia Leukocytosis -Currently on meropenem.  ID recommended to transition to oral ciprofloxacin for total of 14 days of antibiotics once white count gets better: Ciprofloxacin would be 500 mg p.o. every 8 hours or 750 mg p.o. every 12 hours as per pharmacy -Sepsis has resolved.  Currently hemodynamically stable.  Still has significant leukocytosis.  We will continue meropenem for 1 more day.  Repeat a.m. WBCs. -CT scan of the abdomen and pelvis  raised suspicion for diverticulitis however he has no abdominal pain and his exam is unremarkable.   Left kidney hydronephrosis/nephrolithiasis/bladder stone Urinary retention -Urology following. He had left percutaneous nephrostomy tube removal and right  ureteric stent placement along with Foley catheter replacement on 08/15/2021 by urology. -Will need to be discharged with indwelling Foley catheter.  Outpatient follow-up with urology. -Continue Flomax  Essential hypertension -Continue amlodipine  Acute kidney injury -Resolved  Recent psoas abscess/recent E. coli bacteremia during prior hospital stay -This issue seems to have resolved.  Completed course of antibiotics  Right rotator cuff tear -Chronic, has been going on for the past 3 years.  Outpatient follow-up with orthopedics  History of hep C -Outpatient follow-up with ID  Physical deconditioning -Will need to return back to long-term care   DVT prophylaxis: Heparin subcutaneous Code Status: Full Family Communication: None at bedside Disposition Plan: Status is: Inpatient Remains inpatient appropriate because: Of severity of illness.  Continue IV meropenem for another day because of significant leukocytosis.  Possible discharge in a.m.    Consultants: Urology  Procedures: As above  Antimicrobials: Meropenem from 08/15/2021 onwards   Subjective: Patient seen and examined at bedside.  Denies worsening abdominal pain, nausea, vomiting or fever.  Slow to respond.  Objective: Vitals:   08/18/21 1315 08/18/21 2143 08/19/21 0500 08/19/21 0800  BP: 130/89 135/83 129/81   Pulse: 90 90 88   Resp: 16 (!) 22 16   Temp: 98.2 F (36.8 C) 97.9 F (36.6 C) 97.9 F (36.6 C)   TempSrc: Oral Oral Oral   SpO2: 97%  99% 98%  Weight:      Height:        Intake/Output Summary (Last 24 hours) at 08/19/2021 0919 Last data filed at 08/19/2021 0514 Gross per 24 hour  Intake 1020 ml  Output 3050 ml  Net -2030 ml   Danley Danker  Weights   08/12/21 1541 08/12/21 2121  Weight: 57.6 kg 63.6 kg    Examination:  General exam: Appears calm and comfortable.  Looks chronically ill and deconditioned.  Currently on room air.  Slow to respond. Respiratory system: Bilateral decreased breath  sounds at bases with intermittent tachypnea Cardiovascular system: S1 & S2 heard, Rate controlled Gastrointestinal system: Abdomen is nondistended, soft and nontender. Normal bowel sounds heard. Extremities: No cyanosis, clubbing; trace lower extremity edema  Central nervous system: Alert; slow to respond.  No focal neurological deficits. Moving extremities Skin: No rashes, lesions or ulcers Psychiatry: Flat affect.  No signs of agitation.   Genitourinary: Indwelling Foley catheter present   Data Reviewed: I have personally reviewed following labs and imaging studies  CBC: Recent Labs  Lab 08/12/21 1552 08/13/21 0105 08/15/21 0524 08/16/21 0440 08/17/21 0433 08/18/21 0356 08/19/21 0452  WBC 26.7*   < > 17.1* 14.4* 16.9* 18.6* 18.4*  NEUTROABS 23.5*  --   --   --   --   --   --   HGB 11.0*   < > 9.6* 10.9* 10.8* 11.0* 11.3*  HCT 34.5*   < > 30.8* 33.4* 34.2* 33.6* 36.1*  MCV 85.2   < > 84.2 80.7 82.0 81.4 82.4  PLT 320   < > 226 232 257 311 375   < > = values in this interval not displayed.   Basic Metabolic Panel: Recent Labs  Lab 08/13/21 0436 08/14/21 0449 08/15/21 0524 08/16/21 0440 08/17/21 0433 08/18/21 0356 08/19/21 0452  NA  --  140 139 135 135 138 137  K  --  3.6 3.6 2.9* 3.6 3.9 5.1  CL  --  116* 114* 104 104 106 104  CO2  --  21* 19* '22 22 26 26  '$ GLUCOSE  --  93 101* 104* 85 101* 99  BUN  --  '22 21 15 15 16 19  '$ CREATININE  --  1.43* 1.16 1.05 1.09 1.10 1.17  CALCIUM  --  8.0* 8.0* 8.3* 8.4* 8.9 8.9  MG 1.3* 2.1 2.0  --  1.9  --  2.2   GFR: Estimated Creatinine Clearance: 50.6 mL/min (by C-G formula based on SCr of 1.17 mg/dL). Liver Function Tests: Recent Labs  Lab 08/12/21 1552 08/13/21 0105 08/14/21 0449  AST 54* 51* 24  ALT 39 38 23  ALKPHOS 83 70 60  BILITOT 0.7 1.0 0.6  PROT 6.8 6.4* 5.7*  ALBUMIN 2.8* 2.5* 2.2*   Recent Labs  Lab 08/12/21 1552  LIPASE 23   No results for input(s): AMMONIA in the last 168 hours. Coagulation  Profile: Recent Labs  Lab 08/12/21 1601  INR 1.2   Cardiac Enzymes: No results for input(s): CKTOTAL, CKMB, CKMBINDEX, TROPONINI in the last 168 hours. BNP (last 3 results) No results for input(s): PROBNP in the last 8760 hours. HbA1C: No results for input(s): HGBA1C in the last 72 hours. CBG: No results for input(s): GLUCAP in the last 168 hours. Lipid Profile: No results for input(s): CHOL, HDL, LDLCALC, TRIG, CHOLHDL, LDLDIRECT in the last 72 hours. Thyroid Function Tests: No results for input(s): TSH, T4TOTAL, FREET4, T3FREE, THYROIDAB in the last 72 hours. Anemia Panel: No results for input(s): VITAMINB12, FOLATE, FERRITIN, TIBC, IRON, RETICCTPCT in the last 72 hours. Sepsis Labs: Recent Labs  Lab 08/12/21 2232 08/13/21 0105 08/13/21 0436 08/13/21 0658  LATICACIDVEN 4.0* 2.6* 2.0* 2.3*    Recent Results (from the past 240 hour(s))  Blood culture (routine x 2)  Status: Abnormal   Collection Time: 08/12/21  3:52 PM   Specimen: BLOOD  Result Value Ref Range Status   Specimen Description BLOOD LEFT ANTECUBITAL  Final   Special Requests   Final    BOTTLES DRAWN AEROBIC AND ANAEROBIC Blood Culture adequate volume   Culture  Setup Time   Final    GRAM NEGATIVE RODS IN BOTH AEROBIC AND ANAEROBIC BOTTLES CRITICAL RESULT CALLED TO, READ BACK BY AND VERIFIED WITH: PHARMD A ELLINGTON [id number] FCP    Culture PSEUDOMONAS AERUGINOSA (A)  Final   Report Status 08/15/2021 FINAL  Final   Organism ID, Bacteria PSEUDOMONAS AERUGINOSA  Final      Susceptibility   Pseudomonas aeruginosa - MIC*    CEFTAZIDIME 8 SENSITIVE Sensitive     CIPROFLOXACIN <=0.25 SENSITIVE Sensitive     GENTAMICIN 2 SENSITIVE Sensitive     IMIPENEM 2 SENSITIVE Sensitive     CEFEPIME Value in next row Resistant      RESISTANT16Performed at [facility], [place] [address] [place], [place] [address]    * PSEUDOMONAS AERUGINOSA  Blood culture (routine x 2)     Status: Abnormal   Collection Time:  08/12/21  3:52 PM   Specimen: BLOOD  Result Value Ref Range Status   Specimen Description   Final    BLOOD LEFT ANTECUBITAL Performed at [facility] [address], [place], [place] [address]    Special Requests   Final    BOTTLES DRAWN AEROBIC AND ANAEROBIC Blood Culture adequate volume Performed at [facility] [address], [place], [place] [address]    Culture  Setup Time   Final    GRAM NEGATIVE RODS AEROBIC BOTTLE ONLY CRITICAL VALUE NOTED.  VALUE IS CONSISTENT WITH PREVIOUSLY REPORTED AND CALLED VALUE.    Culture (A)  Final    PSEUDOMONAS AERUGINOSA SUSCEPTIBILITIES PERFORMED ON PREVIOUS CULTURE WITHIN THE LAST 5 DAYS. Performed at [facility], [place] [address] [place], [place] [address]    Report Status 08/15/2021 FINAL  Final  Blood Culture ID Panel (Reflexed)     Status: Abnormal   Collection Time: 08/12/21  3:52 PM  Result Value Ref Range Status   Enterococcus faecalis NOT DETECTED NOT DETECTED Final   Enterococcus Faecium NOT DETECTED NOT DETECTED Final   Listeria monocytogenes NOT DETECTED NOT DETECTED Final   Staphylococcus species NOT DETECTED NOT DETECTED Final   Staphylococcus aureus (BCID) NOT DETECTED NOT DETECTED Final   Staphylococcus epidermidis NOT DETECTED NOT DETECTED Final   Staphylococcus lugdunensis NOT DETECTED NOT DETECTED Final   Streptococcus species NOT DETECTED NOT DETECTED Final   Streptococcus agalactiae NOT DETECTED NOT DETECTED Final   Streptococcus pneumoniae NOT DETECTED NOT DETECTED Final   Streptococcus pyogenes NOT DETECTED NOT DETECTED Final   A.calcoaceticus-baumannii NOT DETECTED NOT DETECTED Final   Bacteroides fragilis NOT DETECTED NOT DETECTED Final   Enterobacterales NOT DETECTED NOT DETECTED Final   Enterobacter cloacae complex NOT DETECTED NOT DETECTED Final   Escherichia coli NOT DETECTED NOT DETECTED Final   Klebsiella aerogenes NOT DETECTED NOT DETECTED Final   Klebsiella  oxytoca NOT DETECTED NOT DETECTED Final   Klebsiella pneumoniae NOT DETECTED NOT DETECTED Final   Proteus species NOT DETECTED NOT DETECTED Final   Salmonella species NOT DETECTED NOT DETECTED Final   Serratia marcescens NOT DETECTED NOT DETECTED Final   Haemophilus influenzae NOT DETECTED NOT DETECTED Final   Neisseria meningitidis NOT DETECTED NOT DETECTED Final   Pseudomonas  aeruginosa DETECTED (A) NOT DETECTED Final    Comment: CRITICAL RESULT CALLED TO, READ BACK BY AND VERIFIED WITH: PHARMD A ELLINGTON [id number] FCP    Stenotrophomonas maltophilia NOT DETECTED NOT DETECTED Final   Candida albicans NOT DETECTED NOT DETECTED Final   Candida auris NOT DETECTED NOT DETECTED Final   Candida glabrata NOT DETECTED NOT DETECTED Final   Candida krusei NOT DETECTED NOT DETECTED Final   Candida parapsilosis NOT DETECTED NOT DETECTED Final   Candida tropicalis NOT DETECTED NOT DETECTED Final   Cryptococcus neoformans/gattii NOT DETECTED NOT DETECTED Final   CTX-M ESBL NOT DETECTED NOT DETECTED Final   Carbapenem resistance IMP NOT DETECTED NOT DETECTED Final   Carbapenem resistance KPC NOT DETECTED NOT DETECTED Final   Carbapenem resistance NDM NOT DETECTED NOT DETECTED Final   Carbapenem resistance VIM NOT DETECTED NOT DETECTED Final    Comment: Performed at [facility], [address] [place], [place] [address]  Urine Culture     Status: Abnormal   Collection Time: 08/12/21  4:25 PM   Specimen: Urine, Clean Catch  Result Value Ref Range Status   Specimen Description URINE, CLEAN CATCH  Final   Special Requests NONE  Final   Culture >=100,000 COLONIES/mL PSEUDOMONAS AERUGINOSA (A)  Final   Report Status 08/14/2021 FINAL  Final   Organism ID, Bacteria PSEUDOMONAS AERUGINOSA (A)  Final      Susceptibility   Pseudomonas aeruginosa - MIC*    CEFTAZIDIME 4 SENSITIVE Sensitive     CIPROFLOXACIN 0.5 SENSITIVE Sensitive     GENTAMICIN 4 SENSITIVE Sensitive     IMIPENEM 2 SENSITIVE  Sensitive     CEFEPIME Value in next row Sensitive      SENSITIVE8Performed at [facility] [address], [place], [place] [address]    * >=100,000 COLONIES/mL PSEUDOMONAS AERUGINOSA  Resp Panel by RT-PCR (Flu A&B, Covid) Nasopharyngeal Swab     Status: None   Collection Time: 08/12/21  4:25 PM   Specimen: Nasopharyngeal Swab; Nasopharyngeal(NP) swabs in vial transport medium  Result Value Ref Range Status   SARS Coronavirus 2 by RT PCR NEGATIVE NEGATIVE Final    Comment: (NOTE) SARS-CoV-2 target nucleic acids are NOT DETECTED.  The SARS-CoV-2 RNA is generally detectable in upper respiratory specimens during the acute phase of infection. The lowest concentration of SARS-CoV-2 viral copies this assay can detect is 138 copies/mL. A negative result does not preclude SARS-Cov-2 infection and should not be used as the sole basis for treatment or other patient management decisions. A negative result may occur with  improper specimen collection/handling, submission of specimen other than nasopharyngeal swab, presence of viral mutation(s) within the areas targeted by this assay, and inadequate number of viral copies(<138 copies/mL). A negative result must be combined with clinical observations, patient history, and epidemiological information. The expected result is Negative.  Fact Sheet for Patients:  EntrepreneurPulse.com.au  Fact Sheet for Healthcare Providers:  IncredibleEmployment.be  This test is no t yet approved or cleared by the [place] FDA and  has been authorized for detection and/or diagnosis of SARS-CoV-2 by FDA under an Emergency Use Authorization (EUA). This EUA will remain  in effect (meaning this test can be used) for the duration of the COVID-19 declaration under Section 564(b)(1) of the Act, 21 U.S.C.section 360bbb-3(b)(1), unless the authorization is terminated  or revoked sooner.       Influenza A by PCR NEGATIVE  NEGATIVE Final   Influenza B by PCR NEGATIVE NEGATIVE  Final    Comment: (NOTE) The Xpert Xpress SARS-CoV-2/FLU/RSV plus assay is intended as an aid in the diagnosis of influenza from Nasopharyngeal swab specimens and should not be used as a sole basis for treatment. Nasal washings and aspirates are unacceptable for Xpert Xpress SARS-CoV-2/FLU/RSV testing.  Fact Sheet for Patients: EntrepreneurPulse.com.au  Fact Sheet for Healthcare Providers: IncredibleEmployment.be  This test is not yet approved or cleared by the [place] FDA and has been authorized for detection and/or diagnosis of SARS-CoV-2 by FDA under an Emergency Use Authorization (EUA). This EUA will remain in effect (meaning this test can be used) for the duration of the COVID-19 declaration under Section 564(b)(1) of the Act, 21 U.S.C. section 360bbb-3(b)(1), unless the authorization is terminated or revoked.  Performed at [facility], [place] [address], [place], [place] [address]   MRSA Next Gen by PCR, Nasal     Status: None   Collection Time: 08/13/21  6:36 AM   Specimen: Nasal Mucosa; Nasal Swab  Result Value Ref Range Status   MRSA by PCR Next Gen NOT DETECTED NOT DETECTED Final    Comment: (NOTE) The GeneXpert MRSA Assay (FDA approved for NASAL specimens only), is one component of a comprehensive MRSA colonization surveillance program. It is not intended to diagnose MRSA infection nor to guide or monitor treatment for MRSA infections. Test performance is not FDA approved in patients less than 32 years old. Performed at [facility], [place] [address], [place], [place] [address]          Radiology Studies: No results found.      Scheduled Meds:  amLODipine  5 mg Oral Daily   Chlorhexidine Gluconate Cloth  6 each Topical Daily   dorzolamide-timolol  1 drop Left Eye BID   feeding supplement  237 mL Oral BID BM   heparin   5,000 Units Subcutaneous Q8H   latanoprost  1 drop Left Eye QHS   multivitamin with minerals  1 tablet Oral Daily   Netarsudil Dimesylate  1 drop Left Eye QHS   tamsulosin  0.4 mg Oral QHS   umeclidinium bromide  1 puff Inhalation Daily   Continuous Infusions:  meropenem (MERREM) IV 1 g (08/19/21 0058)          Aline August, MD [facility] 08/19/2021, 9:19 AM

## 2021-08-20 DIAGNOSIS — N179 Acute kidney failure, unspecified: Secondary | ICD-10-CM | POA: Diagnosis not present

## 2021-08-20 DIAGNOSIS — B1920 Unspecified viral hepatitis C without hepatic coma: Secondary | ICD-10-CM | POA: Diagnosis not present

## 2021-08-20 DIAGNOSIS — R7881 Bacteremia: Secondary | ICD-10-CM | POA: Diagnosis not present

## 2021-08-20 DIAGNOSIS — A419 Sepsis, unspecified organism: Secondary | ICD-10-CM | POA: Diagnosis not present

## 2021-08-20 LAB — CBC WITH DIFFERENTIAL/PLATELET
Abs Immature Granulocytes: 1.52 10*3/uL — ABNORMAL HIGH (ref 0.00–0.07)
Basophils Absolute: 0.1 10*3/uL (ref 0.0–0.1)
Basophils Relative: 0 %
Eosinophils Absolute: 0.7 10*3/uL — ABNORMAL HIGH (ref 0.0–0.5)
Eosinophils Relative: 4 %
HCT: 36.5 % — ABNORMAL LOW (ref 39.0–52.0)
Hemoglobin: 11.4 g/dL — ABNORMAL LOW (ref 13.0–17.0)
Immature Granulocytes: 8 %
Lymphocytes Relative: 21 %
Lymphs Abs: 3.9 10*3/uL (ref 0.7–4.0)
MCH: 26.1 pg (ref 26.0–34.0)
MCHC: 31.2 g/dL (ref 30.0–36.0)
MCV: 83.5 fL (ref 80.0–100.0)
Monocytes Absolute: 1.5 10*3/uL — ABNORMAL HIGH (ref 0.1–1.0)
Monocytes Relative: 8 %
Neutro Abs: 10.7 10*3/uL — ABNORMAL HIGH (ref 1.7–7.7)
Neutrophils Relative %: 59 %
Platelets: 425 10*3/uL — ABNORMAL HIGH (ref 150–400)
RBC: 4.37 MIL/uL (ref 4.22–5.81)
RDW: 16.6 % — ABNORMAL HIGH (ref 11.5–15.5)
WBC: 18.3 10*3/uL — ABNORMAL HIGH (ref 4.0–10.5)
nRBC: 0 % (ref 0.0–0.2)

## 2021-08-20 LAB — BASIC METABOLIC PANEL
Anion gap: 6 (ref 5–15)
BUN: 27 mg/dL — ABNORMAL HIGH (ref 8–23)
CO2: 26 mmol/L (ref 22–32)
Calcium: 9.1 mg/dL (ref 8.9–10.3)
Chloride: 105 mmol/L (ref 98–111)
Creatinine, Ser: 1.1 mg/dL (ref 0.61–1.24)
GFR, Estimated: >60 mL/min (ref >60)
Glucose, Bld: 101 mg/dL — ABNORMAL HIGH (ref 70–99)
Potassium: 4.8 mmol/L (ref 3.5–5.1)
Sodium: 137 mmol/L (ref 135–145)

## 2021-08-20 LAB — MAGNESIUM: Magnesium: 2.3 mg/dL (ref 1.7–2.4)

## 2021-08-20 MED ORDER — CIPROFLOXACIN HCL 750 MG PO TABS: 750.0000 mg | ORAL_TABLET | Freq: Two times a day (BID) | ORAL | 0 refills | Status: AC

## 2021-08-20 MED ORDER — CIPROFLOXACIN HCL 750 MG PO TABS: 750.0000 mg | ORAL_TABLET | Freq: Two times a day (BID) | ORAL | 0 refills | Status: DC

## 2021-08-20 NOTE — Progress Notes (Signed)
Patient discharging home with son.  IV removed - WNL.  HH set up per TOC.  Reviewed AVS and medications, emphasized importance of completing dose of antibiotics.  Patient verbalizes understanding with no questions at this time.  Patient will DC with foley in place and follow up with urology. Will educate and switch to leg bag when son arrives for pick up after he gets off work today.  Patient resting at this time in NAD.

## 2021-08-20 NOTE — Plan of Care (Signed)
  Problem: Clinical Measurements: Goal: Ability to maintain clinical measurements within normal limits will improve Outcome: Adequate for Discharge   Problem: Nutrition: Goal: Adequate nutrition will be maintained Outcome: Adequate for Discharge   Problem: Pain Managment: Goal: General experience of comfort will improve Outcome: Adequate for Discharge

## 2021-08-20 NOTE — TOC Transition Note (Addendum)
Transition of Care [facility]) - CM/SW Discharge Note   Patient Details  Name: Ian Boyd MRN: [id number] Date of Birth: [birth year]  Transition of [facility] CM/SW Contact:  Dessa Phi, RN Phone Number: 08/20/2021, 9:38 AM   Clinical Narrative: Patient A+0x3-declines return to [facility] aware. Agrees to home w/HHC-informed of intermittent services-HHPT/OT-patient agrees. Will check on [facility] agency to accept. Has own transport home.   -11:32 [facility] rep Ramond Marrow accepted for HHPT/OT. No further CM needs.   Final next level of care: [place] Barriers to Discharge: No Barriers Identified   Patient Goals and CMS Choice Patient states their goals for this hospitalization and ongoing recovery are:: Home CMS Medicare.gov Compare Post Acute Care list provided to:: Patient Choice offered to / list presented to : Patient  Discharge Placement                       Discharge Plan and Services   Discharge Planning Services: CM Consult Post Acute Care Choice: Home Health                               Social Determinants of Health (SDOH) Interventions     Readmission Risk Interventions     View : No data to display.

## 2021-08-20 NOTE — Consult Note (Addendum)
[facility] Inpatient Consult  08/20/2021  Ian Boyd 09/02/1950 [id number]  [facility] CM)   Patient chart has been reviewed with noted high risk score for unplanned readmissions.  Patient assessed for community [facility] follow up needs.   12:55pm [facility] Follow Up: Spoke with Juliann Pulse, RN Case Manager. Patient for home with home health services. Per review, patient's primary MD is outside of [facility] network of physicians. [facility] CM does not follow.   Of note, [facility] Care Management services does not replace or interfere with any services that are arranged by inpatient case management or social work.    Netta Cedars, MSN, RN [facility] Liaison Toll free office [phone]

## 2021-08-20 NOTE — Care Management Important Message (Signed)
Important Message  Patient Details IM Letter given to the Patient. Name: Ian Boyd MRN: [id number] Date of Birth: [birth year]   Medicare Important Message Given:  Yes     Kerin Salen 08/20/2021, 12:40 PM

## 2021-08-20 NOTE — Discharge Summary (Signed)
Physician Discharge Summary  Ian Boyd YHC:[id number] DOB: [birth year] DOA: 08/12/2021  PCP: Curlene Labrum, MD  Admit date: 08/12/2021 Discharge date: 08/20/2021  Admitted From: Long term care facility Disposition: Home; refuses to return back to long-term care facility  Recommendations for Outpatient Follow-up:  Follow up with PCP in 1 week with repeat CBC/BMP Outpatient follow-up with urology Follow up in ED if symptoms worsen or new appear   Home Health: Home health PT/OT Equipment/Devices: Foley catheter  Discharge Condition: Stable CODE STATUS: Full Diet recommendation: Heart healthy  Brief/Interim Summary: 71 y.o. male with medical history significant of hypertension, recent psoas abscess, COPD, nephrolithiasis, comes into the hospital from the SNF due to fever and tachycardia.  He was recently hospitalized in April 2023 with E. coli bacteremia felt to be due to UTI, also found to have nephrolithiasis, and a psoas abscess.  He had a drain placed at that time, and ID followed patient while hospitalized.  Eventually improved; he followed-up with ID as an outpatient about 3 weeks ago, and was supposed to stay on cefadroxil until 5/25.  He underwent treatment for his nephrolithiasis 5/22 with cystoscopy, right ureteroscopy, stone extraction and right ureteral stent placement.  24 hours afterwards developed a fever, tachycardia, and was brought to the hospital.  He was found to have Pseudomonas bacteremia as well as Pseudomonas UTI.  Urology and ID consulted.  He had left percutaneous nephrostomy tube removal and right ureteric stent placement along with Foley catheter replacement on 08/15/2021 by urology.  Subsequently, urology has cleared him for discharge.  He will be discharged home today with indwelling Foley catheter on oral antibiotics.  He refused to return back to long-term care facility.  Outpatient follow-up with PCP and urology.    Discharge Diagnoses:   Sepsis:  Present on admission Pseudomonas UTI and bacteremia Leukocytosis -Currently on meropenem.  ID recommended to transition to oral ciprofloxacin for total of 14 days of antibiotics once white count gets better:  -Sepsis has resolved.  Currently hemodynamically stable.  Still has significant leukocytosis but patient is afebrile and wants to go home today. -CT scan of the abdomen and pelvis  raised suspicion for diverticulitis however he has no abdominal pain and his exam is unremarkable.  -Urology has cleared the patient for discharge with outpatient follow-up with urology. -Discharge home today on ciprofloxacin 750 mg twice a day to complete total antibiotic course of 14 days   Left kidney hydronephrosis/nephrolithiasis/bladder stone Urinary retention -Urology following. He had left percutaneous nephrostomy tube removal and right ureteric stent placement along with Foley catheter replacement on 08/15/2021 by urology. -Will need to be discharged with indwelling Foley catheter.  Outpatient follow-up with urology. -Continue Flomax   Essential hypertension -Continue amlodipine  Acute kidney injury -Resolved   Recent psoas abscess/recent E. coli bacteremia during prior hospital stay -This issue seems to have resolved.  Completed course of antibiotics   Right rotator cuff tear -Chronic, has been going on for the past 3 years.  Outpatient follow-up with orthopedics   History of hep C -Outpatient follow-up with ID   Physical deconditioning -Refusing to return back to long-term care.  Will need home health PT/OT  Discharge Instructions  Discharge Instructions     Diet - low sodium heart healthy   Complete by: As directed    Face-to-face encounter (required for Medicare/Medicaid patients)   Complete by: As directed    I Ian Boyd certify that this patient is under my care and that I, or  a nurse practitioner or physician's assistant working with me, had a face-to-face encounter that  meets the physician face-to-face encounter requirements with this patient on 08/20/2021. The encounter with the patient was in whole, or in part for the following medical condition(s) which is the primary reason for home health care (List medical condition): bacteremia/weakness   The encounter with the patient was in whole, or in part, for the following medical condition, which is the primary reason for home health care: bacteremia/weakness   I certify that, based on my findings, the following services are medically necessary home health services: Physical therapy   Reason for Medically Necessary Home Health Services: Therapy- Therapeutic Exercises to Increase Strength and Endurance   My clinical findings support the need for the above services: Unable to leave home safely without assistance and/or assistive device   Further, I certify that my clinical findings support that this patient is homebound due to: Unsafe ambulation due to balance issues   Home Health   Complete by: As directed    To provide the following care/treatments:  PT OT     Increase activity slowly   Complete by: As directed       Allergies as of 08/20/2021   No Known Allergies      Medication List     STOP taking these medications    cefadroxil 500 MG capsule Commonly known as: DURICEF   cephALEXin 500 MG capsule Commonly known as: KEFLEX   oxyCODONE-acetaminophen 5-325 MG tablet Commonly known as: Percocet       TAKE these medications    amLODipine 5 MG tablet Commonly known as: NORVASC Take 5 mg by mouth daily.   ciprofloxacin 750 MG tablet Commonly known as: CIPRO Take 1 tablet (750 mg total) by mouth 2 (two) times daily for 9 days.   docusate sodium 100 MG capsule Commonly known as: Colace Take 1 capsule (100 mg total) by mouth daily as needed. What changed: reasons to take this   dorzolamide-timolol 22.3-6.8 MG/ML ophthalmic solution Commonly known as: COSOPT Place 1 drop into the left eye  2 (two) times daily. (0900 & 2100)   Incruse Ellipta 62.5 MCG/ACT Aepb Generic drug: umeclidinium bromide Inhale 1 puff into the lungs in the morning.   NUTRITIONAL SUPPLEMENT PO Take 120 mLs by mouth in the morning, at noon, and at bedtime. Medpass (0800,1300 & 1700)   pantoprazole 40 MG tablet Commonly known as: PROTONIX TAKE 1 TABLET (40 MG TOTAL) BY MOUTH 2 (TWO) TIMES DAILY BEFORE A MEAL.   ProAir Digihaler 108 (90 Base) MCG/ACT Aepb Generic drug: Albuterol Sulfate (sensor) Inhale 1 puff into the lungs daily as needed (shortness of breath).   Rhopressa 0.02 % Soln Generic drug: Netarsudil Dimesylate Place 1 drop into the left eye at bedtime. (2100)   Spiriva Respimat 2.5 MCG/ACT Aers Generic drug: Tiotropium Bromide Monohydrate Take 1 Inhaler by mouth daily.   tamsulosin 0.4 MG Caps capsule Commonly known as: FLOMAX Take 0.4 mg by mouth at bedtime. (2100)   Vyzulta 0.024 % Soln Generic drug: Latanoprostene Bunod Place 1 drop into the left eye at bedtime. (2100)        Follow-up Information     Janith Lima, MD. Schedule an appointment as soon as possible for a visit in 1 week(s).   Specialty: Urology Contact information: [place] [address] [phone]                No Known Allergies  Consultations: Urology  Procedures/Studies: CT ABDOMEN PELVIS WO CONTRAST  Result Date: 08/12/2021 CLINICAL DATA:  Nephrostomy tube dislodgement, recent procedure, concern for complication and/or sepsis. EXAM: CT ABDOMEN AND PELVIS WITHOUT CONTRAST TECHNIQUE: Multidetector CT imaging of the abdomen and pelvis was performed following the standard protocol without IV contrast. RADIATION DOSE REDUCTION: This exam was performed according to the departmental dose-optimization program which includes automated exposure control, adjustment of the mA and/or kV according to patient size and/or use of iterative reconstruction technique. COMPARISON:  CT July 16, 2021. FINDINGS: Lower chest: Hypoventilatory change in the lung bases. Hepatobiliary: Hepatic cysts. Gallbladder is unremarkable. No biliary ductal dilation. Pancreas: No pancreatic ductal dilation or evidence of acute inflammation. Spleen: No splenomegaly or focal splenic lesion. Adrenals/Urinary Tract: Bilateral adrenal glands are unremarkable. Left percutaneous nephrostomy tube with pigtail coiled in the renal pelvis. No hydronephrosis. Nonobstructive left renal calculi measure up to 2 mm. Left nephroureterostomy tube with pigtails coiled in the renal pelvis and urinary bladder without hydronephrosis. Nonobstructive right renal stones measure up to 4 mm. Gas in the urinary bladder which is decompressed around a Foley catheter. Stomach/Bowel: No radiopaque enteric contrast material was administered. Stomach is nondistended limiting evaluation. No pathologic dilation of small or large bowel. Right-sided colonic diverticulosis with inflammatory stranding along the cecum/ascending colon. Noninflamed appendix is visualized. Vascular/Lymphatic: Aortic and branch vessel atherosclerosis without abdominal aortic aneurysm. No pathologically enlarged abdominal or pelvic lymph nodes within the limitation of no intravenous contrast material and relative paucity of peritoneal fat. Reproductive: Enlarged prostate gland. Other: No walled off fluid collections. No pneumoperitoneum. Small bilateral inguinal hernias both containing fat and a short segment of nonobstructed bowel. Musculoskeletal: Multilevel degenerative changes spine most severe at L4-L5 marked discogenic disease and Modic type endplate changes. IMPRESSION: 1. There is inflammatory stranding along the cecum and ascending colon the etiology of which is difficult to determine given the relative paucity of abdominal fat and lack of intravenous contrast material. There is no significant right-sided perinephric stranding and the appendix appears normal. However,  there are right-sided colonic diverticula, as such these findings are most compatible with acute uncomplicated diverticulitis. 2. Left percutaneous nephrostomy tube with pigtails coiled in the renal pelvis and urinary bladder without hydronephrosis. 3. Left nephroureterostomy tube with pigtails coiled in the renal pelvis and urinary bladder without hydronephrosis. 4. Bilateral nonobstructive renal stones. 5. Small bilateral inguinal hernias both containing fat and a short segment of nonobstructed bowel. 6.  Aortic Atherosclerosis (ICD10-I70.0). Electronically Signed   By: Dahlia Bailiff M.D.   On: 08/12/2021 16:31   DG CHEST PORT 1 VIEW  Result Date: 08/14/2021 CLINICAL DATA:  COPD EXAM: PORTABLE CHEST 1 VIEW COMPARISON:  Chest x-ray dated June 25, 2021 FINDINGS: Cardiac and mediastinal contours within normal limits. Focal consolidation. New mild hazy right basilar opacity, possibly due to atelectasis or layering pleural effusion. No evidence of pneumothorax. IMPRESSION: New mild hazy right basilar opacity, possibly due to atelectasis or small layering pleural effusion. Electronically Signed   By: Yetta Glassman M.D.   On: 08/14/2021 13:54   DG Chest Portable 1 View  Result Date: 08/12/2021 CLINICAL DATA:  Cough EXAM: PORTABLE CHEST 1 VIEW COMPARISON:  06/25/2021 FINDINGS: Cardiac size is within normal limits. Thoracic aorta is tortuous. There are no signs of pulmonary edema or focal pulmonary consolidation. There is no pleural effusion or pneumothorax. IMPRESSION: No active disease. Electronically Signed   By: Elmer Picker M.D.   On: 08/12/2021 16:13   DG C-Arm 1-60 Min-No Report  Result  Date: 08/11/2021 Fluoroscopy was utilized by the requesting physician.  No radiographic interpretation.   DG C-Arm 1-60 Min-No Report  Result Date: 08/11/2021 Fluoroscopy was utilized by the requesting physician.  No radiographic interpretation.   DG C-Arm 1-60 Min-No Report  Result Date:  08/11/2021 Fluoroscopy was utilized by the requesting physician.  No radiographic interpretation.      Subjective: Patient seen and examined at bedside.  Feels much better and is adamant that she wants to go home today and not back to his long-term care facility.  No overnight fever or vomiting reported.  Discharge Exam: Vitals:   08/20/21 0451 08/20/21 0820  BP: 128/87   Pulse: 81   Resp: 18   Temp: 97.9 F (36.6 C)   SpO2: 98% 99%    General: Pt is alert, awake, not in acute distress.  Looks chronically ill and deconditioned.  Currently on room air.  Indwelling Foley catheter present.  Poor historian.  Slow to respond. Cardiovascular: rate controlled, S1/S2 + Respiratory: bilateral decreased breath sounds at bases Abdominal: Soft, NT, ND, bowel sounds + Extremities: Trace lower extremity edema, no cyanosis    The results of significant diagnostics from this hospitalization (including imaging, microbiology, ancillary and laboratory) are listed below for reference.     Microbiology: Recent Results (from the past 240 hour(s))  Blood culture (routine x 2)     Status: Abnormal   Collection Time: 08/12/21  3:52 PM   Specimen: BLOOD  Result Value Ref Range Status   Specimen Description BLOOD LEFT ANTECUBITAL  Final   Special Requests   Final    BOTTLES DRAWN AEROBIC AND ANAEROBIC Blood Culture adequate volume   Culture  Setup Time   Final    GRAM NEGATIVE RODS IN BOTH AEROBIC AND ANAEROBIC BOTTLES CRITICAL RESULT CALLED TO, READ BACK BY AND VERIFIED WITH: PHARMD A ELLINGTON [id number] FCP    Culture PSEUDOMONAS AERUGINOSA (A)  Final   Report Status 08/15/2021 FINAL  Final   Organism ID, Bacteria PSEUDOMONAS AERUGINOSA  Final      Susceptibility   Pseudomonas aeruginosa - MIC*    CEFTAZIDIME 8 SENSITIVE Sensitive     CIPROFLOXACIN <=0.25 SENSITIVE Sensitive     GENTAMICIN 2 SENSITIVE Sensitive     IMIPENEM 2 SENSITIVE Sensitive     CEFEPIME Value in next row Resistant       RESISTANT16Performed at [facility], [place] [address], [place], [place] [address]    * PSEUDOMONAS AERUGINOSA  Blood culture (routine x 2)     Status: Abnormal   Collection Time: 08/12/21  3:52 PM   Specimen: BLOOD  Result Value Ref Range Status   Specimen Description   Final    BLOOD LEFT ANTECUBITAL Performed at [facility] [address], [place], [place] [address]    Special Requests   Final    BOTTLES DRAWN AEROBIC AND ANAEROBIC Blood Culture adequate volume Performed at [facility] [address] [place], [place] [address]    Culture  Setup Time   Final    GRAM NEGATIVE RODS AEROBIC BOTTLE ONLY CRITICAL VALUE NOTED.  VALUE IS CONSISTENT WITH PREVIOUSLY REPORTED AND CALLED VALUE.    Culture (A)  Final    PSEUDOMONAS AERUGINOSA SUSCEPTIBILITIES PERFORMED ON PREVIOUS CULTURE WITHIN THE LAST 5 DAYS. Performed at [facility], [place] [address], [place],  [address]    Report Status 08/15/2021 FINAL  Final  Blood Culture ID Panel (Reflexed)  Status: Abnormal   Collection Time: 08/12/21  3:52 PM  Result Value Ref Range Status   Enterococcus faecalis NOT DETECTED NOT DETECTED Final   Enterococcus Faecium NOT DETECTED NOT DETECTED Final   Listeria monocytogenes NOT DETECTED NOT DETECTED Final   Staphylococcus species NOT DETECTED NOT DETECTED Final   Staphylococcus aureus (BCID) NOT DETECTED NOT DETECTED Final   Staphylococcus epidermidis NOT DETECTED NOT DETECTED Final   Staphylococcus lugdunensis NOT DETECTED NOT DETECTED Final   Streptococcus species NOT DETECTED NOT DETECTED Final   Streptococcus agalactiae NOT DETECTED NOT DETECTED Final   Streptococcus pneumoniae NOT DETECTED NOT DETECTED Final   Streptococcus pyogenes NOT DETECTED NOT DETECTED Final   A.calcoaceticus-baumannii NOT DETECTED NOT DETECTED Final   Bacteroides fragilis NOT DETECTED NOT DETECTED Final   Enterobacterales NOT DETECTED NOT DETECTED  Final   Enterobacter cloacae complex NOT DETECTED NOT DETECTED Final   Escherichia coli NOT DETECTED NOT DETECTED Final   Klebsiella aerogenes NOT DETECTED NOT DETECTED Final   Klebsiella oxytoca NOT DETECTED NOT DETECTED Final   Klebsiella pneumoniae NOT DETECTED NOT DETECTED Final   Proteus species NOT DETECTED NOT DETECTED Final   Salmonella species NOT DETECTED NOT DETECTED Final   Serratia marcescens NOT DETECTED NOT DETECTED Final   Haemophilus influenzae NOT DETECTED NOT DETECTED Final   Neisseria meningitidis NOT DETECTED NOT DETECTED Final   Pseudomonas aeruginosa DETECTED (A) NOT DETECTED Final    Comment: CRITICAL RESULT CALLED TO, READ BACK BY AND VERIFIED WITH: PHARMD A ELLINGTON [id number] FCP    Stenotrophomonas maltophilia NOT DETECTED NOT DETECTED Final   Candida albicans NOT DETECTED NOT DETECTED Final   Candida auris NOT DETECTED NOT DETECTED Final   Candida glabrata NOT DETECTED NOT DETECTED Final   Candida krusei NOT DETECTED NOT DETECTED Final   Candida parapsilosis NOT DETECTED NOT DETECTED Final   Candida tropicalis NOT DETECTED NOT DETECTED Final   Cryptococcus neoformans/gattii NOT DETECTED NOT DETECTED Final   CTX-M ESBL NOT DETECTED NOT DETECTED Final   Carbapenem resistance IMP NOT DETECTED NOT DETECTED Final   Carbapenem resistance KPC NOT DETECTED NOT DETECTED Final   Carbapenem resistance NDM NOT DETECTED NOT DETECTED Final   Carbapenem resistance VIM NOT DETECTED NOT DETECTED Final    Comment: Performed at [facility], [address] [place], [place] [address]  Urine Culture     Status: Abnormal   Collection Time: 08/12/21  4:25 PM   Specimen: Urine, Clean Catch  Result Value Ref Range Status   Specimen Description URINE, CLEAN CATCH  Final   Special Requests NONE  Final   Culture >=100,000 COLONIES/mL PSEUDOMONAS AERUGINOSA (A)  Final   Report Status 08/14/2021 FINAL  Final   Organism ID, Bacteria PSEUDOMONAS AERUGINOSA (A)  Final       Susceptibility   Pseudomonas aeruginosa - MIC*    CEFTAZIDIME 4 SENSITIVE Sensitive     CIPROFLOXACIN 0.5 SENSITIVE Sensitive     GENTAMICIN 4 SENSITIVE Sensitive     IMIPENEM 2 SENSITIVE Sensitive     CEFEPIME Value in next row Sensitive      SENSITIVE8Performed at [facility] [address] [place], [place] [address]    * >=100,000 COLONIES/mL PSEUDOMONAS AERUGINOSA  Resp Panel by RT-PCR (Flu A&B, Covid) Nasopharyngeal Swab     Status: None   Collection Time: 08/12/21  4:25 PM   Specimen: Nasopharyngeal Swab; Nasopharyngeal(NP) swabs in vial transport medium  Result Value Ref Range Status   SARS Coronavirus 2  by RT PCR NEGATIVE NEGATIVE Final    Comment: (NOTE) SARS-CoV-2 target nucleic acids are NOT DETECTED.  The SARS-CoV-2 RNA is generally detectable in upper respiratory specimens during the acute phase of infection. The lowest concentration of SARS-CoV-2 viral copies this assay can detect is 138 copies/mL. A negative result does not preclude SARS-Cov-2 infection and should not be used as the sole basis for treatment or other patient management decisions. A negative result may occur with  improper specimen collection/handling, submission of specimen other than nasopharyngeal swab, presence of viral mutation(s) within the areas targeted by this assay, and inadequate number of viral copies(<138 copies/mL). A negative result must be combined with clinical observations, patient history, and epidemiological information. The expected result is Negative.  Fact Sheet for Patients:  EntrepreneurPulse.com.au  Fact Sheet for Healthcare Providers:  IncredibleEmployment.be  This test is no t yet approved or cleared by the [place] FDA and  has been authorized for detection and/or diagnosis of SARS-CoV-2 by FDA under an Emergency Use Authorization (EUA). This EUA will remain  in effect (meaning this test can be used) for the duration of  the COVID-19 declaration under Section 564(b)(1) of the Act, 21 U.S.C.section 360bbb-3(b)(1), unless the authorization is terminated  or revoked sooner.       Influenza A by PCR NEGATIVE NEGATIVE Final   Influenza B by PCR NEGATIVE NEGATIVE Final    Comment: (NOTE) The Xpert Xpress SARS-CoV-2/FLU/RSV plus assay is intended as an aid in the diagnosis of influenza from Nasopharyngeal swab specimens and should not be used as a sole basis for treatment. Nasal washings and aspirates are unacceptable for Xpert Xpress SARS-CoV-2/FLU/RSV testing.  Fact Sheet for Patients: EntrepreneurPulse.com.au  Fact Sheet for Healthcare Providers: IncredibleEmployment.be  This test is not yet approved or cleared by the [place] FDA and has been authorized for detection and/or diagnosis of SARS-CoV-2 by FDA under an Emergency Use Authorization (EUA). This EUA will remain in effect (meaning this test can be used) for the duration of the COVID-19 declaration under Section 564(b)(1) of the Act, 21 U.S.C. section 360bbb-3(b)(1), unless the authorization is terminated or revoked.  Performed at [facility], [place] [address], [place], [place] [address]   MRSA Next Gen by PCR, Nasal     Status: None   Collection Time: 08/13/21  6:36 AM   Specimen: Nasal Mucosa; Nasal Swab  Result Value Ref Range Status   MRSA by PCR Next Gen NOT DETECTED NOT DETECTED Final    Comment: (NOTE) The GeneXpert MRSA Assay (FDA approved for NASAL specimens only), is one component of a comprehensive MRSA colonization surveillance program. It is not intended to diagnose MRSA infection nor to guide or monitor treatment for MRSA infections. Test performance is not FDA approved in patients less than 20 years old. Performed at [facility], [place] [address], [place], [place] [address]      Labs: BNP (last 3 results) No results for input(s): BNP in the  last 8760 hours. Basic Metabolic Panel: Recent Labs  Lab 08/14/21 0449 08/15/21 0524 08/16/21 0440 08/17/21 0433 08/18/21 0356 08/19/21 0452 08/20/21 0504  NA 140 139 135 135 138 137 137  K 3.6 3.6 2.9* 3.6 3.9 5.1 4.8  CL 116* 114* 104 104 106 104 105  CO2 21* 19* '22 22 26 26 26  '$ GLUCOSE 93 101* 104* 85 101* 99 101*  BUN '22 21 15 15 16 19 '$ 27*  CREATININE 1.43* 1.16 1.05 1.09 1.10 1.17 1.10  CALCIUM 8.0* 8.0* 8.3* 8.4* 8.9 8.9 9.1  MG 2.1 2.0  --  1.9  --  2.2 2.3   Liver Function Tests: Recent Labs  Lab 08/14/21 0449  AST 24  ALT 23  ALKPHOS 60  BILITOT 0.6  PROT 5.7*  ALBUMIN 2.2*   No results for input(s): LIPASE, AMYLASE in the last 168 hours. No results for input(s): AMMONIA in the last 168 hours. CBC: Recent Labs  Lab 08/16/21 0440 08/17/21 0433 08/18/21 0356 08/19/21 0452 08/20/21 0504  WBC 14.4* 16.9* 18.6* 18.4* 18.3*  NEUTROABS  --   --   --   --  10.7*  HGB 10.9* 10.8* 11.0* 11.3* 11.4*  HCT 33.4* 34.2* 33.6* 36.1* 36.5*  MCV 80.7 82.0 81.4 82.4 83.5  PLT 232 257 311 375 425*   Cardiac Enzymes: No results for input(s): CKTOTAL, CKMB, CKMBINDEX, TROPONINI in the last 168 hours. BNP: Invalid input(s): POCBNP CBG: No results for input(s): GLUCAP in the last 168 hours. D-Dimer No results for input(s): DDIMER in the last 72 hours. Hgb A1c No results for input(s): HGBA1C in the last 72 hours. Lipid Profile No results for input(s): CHOL, HDL, LDLCALC, TRIG, CHOLHDL, LDLDIRECT in the last 72 hours. Thyroid function studies No results for input(s): TSH, T4TOTAL, T3FREE, THYROIDAB in the last 72 hours.  Invalid input(s): FREET3 Anemia work up No results for input(s): VITAMINB12, FOLATE, FERRITIN, TIBC, IRON, RETICCTPCT in the last 72 hours. Urinalysis    Component Value Date/Time   COLORURINE AMBER (A) 08/12/2021 1625   APPEARANCEUR TURBID (A) 08/12/2021 1625   LABSPEC 1.017 08/12/2021 1625   PHURINE 5.0 08/12/2021 1625   GLUCOSEU NEGATIVE  08/12/2021 1625   HGBUR LARGE (A) 08/12/2021 1625   BILIRUBINUR NEGATIVE 08/12/2021 1625   KETONESUR NEGATIVE 08/12/2021 1625   PROTEINUR >=300 (A) 08/12/2021 1625   NITRITE NEGATIVE 08/12/2021 1625   LEUKOCYTESUR MODERATE (A) 08/12/2021 1625   Sepsis Labs Invalid input(s): PROCALCITONIN,  WBC,  LACTICIDVEN Microbiology Recent Results (from the past 240 hour(s))  Blood culture (routine x 2)     Status: Abnormal   Collection Time: 08/12/21  3:52 PM   Specimen: BLOOD  Result Value Ref Range Status   Specimen Description BLOOD LEFT ANTECUBITAL  Final   Special Requests   Final    BOTTLES DRAWN AEROBIC AND ANAEROBIC Blood Culture adequate volume   Culture  Setup Time   Final    GRAM NEGATIVE RODS IN BOTH AEROBIC AND ANAEROBIC BOTTLES CRITICAL RESULT CALLED TO, READ BACK BY AND VERIFIED WITH: PHARMD A ELLINGTON [id number] FCP    Culture PSEUDOMONAS AERUGINOSA (A)  Final   Report Status 08/15/2021 FINAL  Final   Organism ID, Bacteria PSEUDOMONAS AERUGINOSA  Final      Susceptibility   Pseudomonas aeruginosa - MIC*    CEFTAZIDIME 8 SENSITIVE Sensitive     CIPROFLOXACIN <=0.25 SENSITIVE Sensitive     GENTAMICIN 2 SENSITIVE Sensitive     IMIPENEM 2 SENSITIVE Sensitive     CEFEPIME Value in next row Resistant      RESISTANT16Performed at [facility], [address] [place], [place] [address]    * PSEUDOMONAS AERUGINOSA  Blood culture (routine x 2)     Status: Abnormal   Collection Time: 08/12/21  3:52 PM   Specimen: BLOOD  Result Value Ref Range Status   Specimen Description   Final    BLOOD LEFT ANTECUBITAL Performed at [facility] [address], [place], [place] [address]  Special Requests   Final    BOTTLES DRAWN AEROBIC AND ANAEROBIC Blood Culture adequate volume Performed at [facility] [address], [place], [place] [address]    Culture  Setup Time   Final    GRAM NEGATIVE RODS AEROBIC BOTTLE ONLY CRITICAL VALUE  NOTED.  VALUE IS CONSISTENT WITH PREVIOUSLY REPORTED AND CALLED VALUE.    Culture (A)  Final    PSEUDOMONAS AERUGINOSA SUSCEPTIBILITIES PERFORMED ON PREVIOUS CULTURE WITHIN THE LAST 5 DAYS. Performed at [facility], [place] [address], [place], [place] [address]    Report Status 08/15/2021 FINAL  Final  Blood Culture ID Panel (Reflexed)     Status: Abnormal   Collection Time: 08/12/21  3:52 PM  Result Value Ref Range Status   Enterococcus faecalis NOT DETECTED NOT DETECTED Final   Enterococcus Faecium NOT DETECTED NOT DETECTED Final   Listeria monocytogenes NOT DETECTED NOT DETECTED Final   Staphylococcus species NOT DETECTED NOT DETECTED Final   Staphylococcus aureus (BCID) NOT DETECTED NOT DETECTED Final   Staphylococcus epidermidis NOT DETECTED NOT DETECTED Final   Staphylococcus lugdunensis NOT DETECTED NOT DETECTED Final   Streptococcus species NOT DETECTED NOT DETECTED Final   Streptococcus agalactiae NOT DETECTED NOT DETECTED Final   Streptococcus pneumoniae NOT DETECTED NOT DETECTED Final   Streptococcus pyogenes NOT DETECTED NOT DETECTED Final   A.calcoaceticus-baumannii NOT DETECTED NOT DETECTED Final   Bacteroides fragilis NOT DETECTED NOT DETECTED Final   Enterobacterales NOT DETECTED NOT DETECTED Final   Enterobacter cloacae complex NOT DETECTED NOT DETECTED Final   Escherichia coli NOT DETECTED NOT DETECTED Final   Klebsiella aerogenes NOT DETECTED NOT DETECTED Final   Klebsiella oxytoca NOT DETECTED NOT DETECTED Final   Klebsiella pneumoniae NOT DETECTED NOT DETECTED Final   Proteus species NOT DETECTED NOT DETECTED Final   Salmonella species NOT DETECTED NOT DETECTED Final   Serratia marcescens NOT DETECTED NOT DETECTED Final   Haemophilus influenzae NOT DETECTED NOT DETECTED Final   Neisseria meningitidis NOT DETECTED NOT DETECTED Final   Pseudomonas aeruginosa DETECTED (A) NOT DETECTED Final    Comment: CRITICAL RESULT CALLED TO, READ BACK BY AND VERIFIED  WITH: PHARMD A ELLINGTON 1406 [id number] FCP    Stenotrophomonas maltophilia NOT DETECTED NOT DETECTED Final   Candida albicans NOT DETECTED NOT DETECTED Final   Candida auris NOT DETECTED NOT DETECTED Final   Candida glabrata NOT DETECTED NOT DETECTED Final   Candida krusei NOT DETECTED NOT DETECTED Final   Candida parapsilosis NOT DETECTED NOT DETECTED Final   Candida tropicalis NOT DETECTED NOT DETECTED Final   Cryptococcus neoformans/gattii NOT DETECTED NOT DETECTED Final   CTX-M ESBL NOT DETECTED NOT DETECTED Final   Carbapenem resistance IMP NOT DETECTED NOT DETECTED Final   Carbapenem resistance KPC NOT DETECTED NOT DETECTED Final   Carbapenem resistance NDM NOT DETECTED NOT DETECTED Final   Carbapenem resistance VIM NOT DETECTED NOT DETECTED Final    Comment: Performed at [facility], [address], [place], [place] [address]  Urine Culture     Status: Abnormal   Collection Time: 08/12/21  4:25 PM   Specimen: Urine, Clean Catch  Result Value Ref Range Status   Specimen Description URINE, CLEAN CATCH  Final   Special Requests NONE  Final   Culture >=100,000 COLONIES/mL PSEUDOMONAS AERUGINOSA (A)  Final   Report Status 08/14/2021 FINAL  Final   Organism ID, Bacteria PSEUDOMONAS AERUGINOSA (A)  Final      Susceptibility   Pseudomonas  aeruginosa - MIC*    CEFTAZIDIME 4 SENSITIVE Sensitive     CIPROFLOXACIN 0.5 SENSITIVE Sensitive     GENTAMICIN 4 SENSITIVE Sensitive     IMIPENEM 2 SENSITIVE Sensitive     CEFEPIME Value in next row Sensitive      SENSITIVE8Performed at [facility] [address] [place], [place] [address]    * >=100,000 COLONIES/mL PSEUDOMONAS AERUGINOSA  Resp Panel by RT-PCR (Flu A&B, Covid) Nasopharyngeal Swab     Status: None   Collection Time: 08/12/21  4:25 PM   Specimen: Nasopharyngeal Swab; Nasopharyngeal(NP) swabs in vial transport medium  Result Value Ref Range Status   SARS Coronavirus 2 by RT PCR NEGATIVE NEGATIVE Final    Comment:  (NOTE) SARS-CoV-2 target nucleic acids are NOT DETECTED.  The SARS-CoV-2 RNA is generally detectable in upper respiratory specimens during the acute phase of infection. The lowest concentration of SARS-CoV-2 viral copies this assay can detect is 138 copies/mL. A negative result does not preclude SARS-Cov-2 infection and should not be used as the sole basis for treatment or other patient management decisions. A negative result may occur with  improper specimen collection/handling, submission of specimen other than nasopharyngeal swab, presence of viral mutation(s) within the areas targeted by this assay, and inadequate number of viral copies(<138 copies/mL). A negative result must be combined with clinical observations, patient history, and epidemiological information. The expected result is Negative.  Fact Sheet for Patients:  EntrepreneurPulse.com.au  Fact Sheet for Healthcare Providers:  IncredibleEmployment.be  This test is no t yet approved or cleared by the [place] FDA and  has been authorized for detection and/or diagnosis of SARS-CoV-2 by FDA under an Emergency Use Authorization (EUA). This EUA will remain  in effect (meaning this test can be used) for the duration of the COVID-19 declaration under Section 564(b)(1) of the Act, 21 U.S.C.section 360bbb-3(b)(1), unless the authorization is terminated  or revoked sooner.       Influenza A by PCR NEGATIVE NEGATIVE Final   Influenza B by PCR NEGATIVE NEGATIVE Final    Comment: (NOTE) The Xpert Xpress SARS-CoV-2/FLU/RSV plus assay is intended as an aid in the diagnosis of influenza from Nasopharyngeal swab specimens and should not be used as a sole basis for treatment. Nasal washings and aspirates are unacceptable for Xpert Xpress SARS-CoV-2/FLU/RSV testing.  Fact Sheet for Patients: EntrepreneurPulse.com.au  Fact Sheet for Healthcare  Providers: IncredibleEmployment.be  This test is not yet approved or cleared by the [place] FDA and has been authorized for detection and/or diagnosis of SARS-CoV-2 by FDA under an Emergency Use Authorization (EUA). This EUA will remain in effect (meaning this test can be used) for the duration of the COVID-19 declaration under Section 564(b)(1) of the Act, 21 U.S.C. section 360bbb-3(b)(1), unless the authorization is terminated or revoked.  Performed at [facility], [place] [address] [place], [place] [address]   MRSA Next Gen by PCR, Nasal     Status: None   Collection Time: 08/13/21  6:36 AM   Specimen: Nasal Mucosa; Nasal Swab  Result Value Ref Range Status   MRSA by PCR Next Gen NOT DETECTED NOT DETECTED Final    Comment: (NOTE) The GeneXpert MRSA Assay (FDA approved for NASAL specimens only), is one component of a comprehensive MRSA colonization surveillance program. It is not intended to diagnose MRSA infection nor to guide or monitor treatment for MRSA infections. Test performance is not FDA approved in patients less than 48 years old. Performed at  [facility], [place] [address] [place], [place] [address]      Time coordinating discharge: 35 minutes  SIGNED:   Aline August, MD  [facility] 08/20/2021, 9:48 AM

## 2021-08-20 NOTE — Progress Notes (Signed)
Foley care teaching complete with patient and patients son. Gauze dressing on left flank was removed while getting patient dressed. A small stitch was found where his nephrostomy tube was previously in place. Urologist MD Reines notified of stitch and this RN was given verbal orders to remove stitch before discharge. Removed at bedside with no issue. Gauze placed over area and patient given care instructions. Patient discharged with all patient belongings. All questions answered.

## 2021-08-27 DIAGNOSIS — N200 Calculus of kidney: Secondary | ICD-10-CM | POA: Diagnosis not present

## 2021-08-27 DIAGNOSIS — R3914 Feeling of incomplete bladder emptying: Secondary | ICD-10-CM | POA: Diagnosis not present

## 2021-09-15 DIAGNOSIS — R3914 Feeling of incomplete bladder emptying: Secondary | ICD-10-CM | POA: Diagnosis not present

## 2021-09-19 DIAGNOSIS — J449 Chronic obstructive pulmonary disease, unspecified: Secondary | ICD-10-CM | POA: Diagnosis not present

## 2021-09-19 DIAGNOSIS — J439 Emphysema, unspecified: Secondary | ICD-10-CM | POA: Diagnosis not present

## 2021-09-25 ENCOUNTER — Telehealth: Payer: Self-pay

## 2021-09-25 ENCOUNTER — Other Ambulatory Visit (HOSPITAL_COMMUNITY): Payer: Self-pay

## 2021-09-25 ENCOUNTER — Ambulatory Visit: Payer: Medicare Other | Admitting: Internal Medicine

## 2021-09-25 NOTE — Telephone Encounter (Signed)
RCID Patient Teacher, English as a foreign language completed.    The patient is insured through [employer].  Medication will need a PA.  We will continue to follow to see if copay assistance is needed.  Ileene Patrick, [facility] Patient [facility] Phone: [phone] Fax:  [phone]

## 2021-10-30 DIAGNOSIS — N200 Calculus of kidney: Secondary | ICD-10-CM | POA: Diagnosis not present

## 2021-10-30 DIAGNOSIS — R3914 Feeling of incomplete bladder emptying: Secondary | ICD-10-CM | POA: Diagnosis not present

## 2021-11-02 DIAGNOSIS — K7689 Other specified diseases of liver: Secondary | ICD-10-CM | POA: Diagnosis not present

## 2021-11-02 DIAGNOSIS — I700 Atherosclerosis of aorta: Secondary | ICD-10-CM | POA: Diagnosis not present

## 2021-11-02 DIAGNOSIS — M47816 Spondylosis without myelopathy or radiculopathy, lumbar region: Secondary | ICD-10-CM | POA: Diagnosis not present

## 2021-11-02 DIAGNOSIS — N3289 Other specified disorders of bladder: Secondary | ICD-10-CM | POA: Diagnosis not present

## 2021-11-02 DIAGNOSIS — K5730 Diverticulosis of large intestine without perforation or abscess without bleeding: Secondary | ICD-10-CM | POA: Diagnosis not present

## 2021-11-02 DIAGNOSIS — F17210 Nicotine dependence, cigarettes, uncomplicated: Secondary | ICD-10-CM | POA: Diagnosis not present

## 2021-11-02 DIAGNOSIS — N200 Calculus of kidney: Secondary | ICD-10-CM | POA: Diagnosis not present

## 2021-11-02 DIAGNOSIS — N3091 Cystitis, unspecified with hematuria: Secondary | ICD-10-CM | POA: Diagnosis not present

## 2021-11-03 DIAGNOSIS — R339 Retention of urine, unspecified: Secondary | ICD-10-CM | POA: Diagnosis not present

## 2021-11-03 DIAGNOSIS — R338 Other retention of urine: Secondary | ICD-10-CM | POA: Diagnosis not present

## 2021-11-03 DIAGNOSIS — F17210 Nicotine dependence, cigarettes, uncomplicated: Secondary | ICD-10-CM | POA: Diagnosis not present

## 2021-11-25 DIAGNOSIS — K6812 Psoas muscle abscess: Secondary | ICD-10-CM | POA: Diagnosis not present

## 2021-11-25 DIAGNOSIS — N209 Urinary calculus, unspecified: Secondary | ICD-10-CM | POA: Diagnosis not present

## 2021-11-25 DIAGNOSIS — E559 Vitamin D deficiency, unspecified: Secondary | ICD-10-CM | POA: Diagnosis not present

## 2021-11-25 DIAGNOSIS — A419 Sepsis, unspecified organism: Secondary | ICD-10-CM | POA: Diagnosis not present

## 2021-11-25 DIAGNOSIS — B965 Pseudomonas (aeruginosa) (mallei) (pseudomallei) as the cause of diseases classified elsewhere: Secondary | ICD-10-CM | POA: Diagnosis not present

## 2021-11-25 DIAGNOSIS — I700 Atherosclerosis of aorta: Secondary | ICD-10-CM | POA: Diagnosis not present

## 2021-11-25 DIAGNOSIS — R5383 Other fatigue: Secondary | ICD-10-CM | POA: Diagnosis not present

## 2021-11-25 DIAGNOSIS — N323 Diverticulum of bladder: Secondary | ICD-10-CM | POA: Diagnosis not present

## 2021-11-25 DIAGNOSIS — R1310 Dysphagia, unspecified: Secondary | ICD-10-CM | POA: Diagnosis not present

## 2021-11-25 DIAGNOSIS — N390 Urinary tract infection, site not specified: Secondary | ICD-10-CM | POA: Diagnosis not present

## 2021-11-25 DIAGNOSIS — R7881 Bacteremia: Secondary | ICD-10-CM | POA: Diagnosis not present

## 2021-11-25 DIAGNOSIS — J439 Emphysema, unspecified: Secondary | ICD-10-CM | POA: Diagnosis not present

## 2021-12-01 DIAGNOSIS — R7989 Other specified abnormal findings of blood chemistry: Secondary | ICD-10-CM | POA: Diagnosis not present

## 2021-12-08 DIAGNOSIS — M12811 Other specific arthropathies, not elsewhere classified, right shoulder: Secondary | ICD-10-CM | POA: Diagnosis not present

## 2021-12-08 DIAGNOSIS — M25511 Pain in right shoulder: Secondary | ICD-10-CM | POA: Diagnosis not present

## 2021-12-10 DIAGNOSIS — Z466 Encounter for fitting and adjustment of urinary device: Secondary | ICD-10-CM | POA: Diagnosis not present

## 2021-12-10 DIAGNOSIS — F17210 Nicotine dependence, cigarettes, uncomplicated: Secondary | ICD-10-CM | POA: Diagnosis not present

## 2021-12-10 DIAGNOSIS — R339 Retention of urine, unspecified: Secondary | ICD-10-CM | POA: Diagnosis not present

## 2021-12-16 DIAGNOSIS — M25511 Pain in right shoulder: Secondary | ICD-10-CM | POA: Diagnosis not present

## 2021-12-16 DIAGNOSIS — R29898 Other symptoms and signs involving the musculoskeletal system: Secondary | ICD-10-CM | POA: Diagnosis not present

## 2021-12-16 DIAGNOSIS — M25611 Stiffness of right shoulder, not elsewhere classified: Secondary | ICD-10-CM | POA: Diagnosis not present

## 2021-12-22 DIAGNOSIS — R29898 Other symptoms and signs involving the musculoskeletal system: Secondary | ICD-10-CM | POA: Diagnosis not present

## 2021-12-22 DIAGNOSIS — M25511 Pain in right shoulder: Secondary | ICD-10-CM | POA: Diagnosis not present

## 2021-12-22 DIAGNOSIS — M25611 Stiffness of right shoulder, not elsewhere classified: Secondary | ICD-10-CM | POA: Diagnosis not present

## 2021-12-24 DIAGNOSIS — M25611 Stiffness of right shoulder, not elsewhere classified: Secondary | ICD-10-CM | POA: Diagnosis not present

## 2021-12-24 DIAGNOSIS — R29898 Other symptoms and signs involving the musculoskeletal system: Secondary | ICD-10-CM | POA: Diagnosis not present

## 2021-12-24 DIAGNOSIS — M25511 Pain in right shoulder: Secondary | ICD-10-CM | POA: Diagnosis not present

## 2022-01-01 DIAGNOSIS — M25611 Stiffness of right shoulder, not elsewhere classified: Secondary | ICD-10-CM | POA: Diagnosis not present

## 2022-01-01 DIAGNOSIS — M25511 Pain in right shoulder: Secondary | ICD-10-CM | POA: Diagnosis not present

## 2022-01-01 DIAGNOSIS — R29898 Other symptoms and signs involving the musculoskeletal system: Secondary | ICD-10-CM | POA: Diagnosis not present

## 2022-01-05 DIAGNOSIS — M25611 Stiffness of right shoulder, not elsewhere classified: Secondary | ICD-10-CM | POA: Diagnosis not present

## 2022-01-05 DIAGNOSIS — R29898 Other symptoms and signs involving the musculoskeletal system: Secondary | ICD-10-CM | POA: Diagnosis not present

## 2022-01-05 DIAGNOSIS — M25511 Pain in right shoulder: Secondary | ICD-10-CM | POA: Diagnosis not present

## 2022-01-12 DIAGNOSIS — M25611 Stiffness of right shoulder, not elsewhere classified: Secondary | ICD-10-CM | POA: Diagnosis not present

## 2022-01-12 DIAGNOSIS — R29898 Other symptoms and signs involving the musculoskeletal system: Secondary | ICD-10-CM | POA: Diagnosis not present

## 2022-01-12 DIAGNOSIS — M25511 Pain in right shoulder: Secondary | ICD-10-CM | POA: Diagnosis not present

## 2022-01-19 DIAGNOSIS — M25511 Pain in right shoulder: Secondary | ICD-10-CM | POA: Diagnosis not present

## 2022-01-19 DIAGNOSIS — R29898 Other symptoms and signs involving the musculoskeletal system: Secondary | ICD-10-CM | POA: Diagnosis not present

## 2022-01-19 DIAGNOSIS — M25611 Stiffness of right shoulder, not elsewhere classified: Secondary | ICD-10-CM | POA: Diagnosis not present

## 2022-01-19 DIAGNOSIS — M12811 Other specific arthropathies, not elsewhere classified, right shoulder: Secondary | ICD-10-CM | POA: Diagnosis not present

## 2022-02-11 DIAGNOSIS — R339 Retention of urine, unspecified: Secondary | ICD-10-CM | POA: Diagnosis not present

## 2022-03-21 DIAGNOSIS — F17210 Nicotine dependence, cigarettes, uncomplicated: Secondary | ICD-10-CM | POA: Diagnosis not present

## 2022-03-21 DIAGNOSIS — Z466 Encounter for fitting and adjustment of urinary device: Secondary | ICD-10-CM | POA: Diagnosis not present

## 2022-04-21 DIAGNOSIS — R030 Elevated blood-pressure reading, without diagnosis of hypertension: Secondary | ICD-10-CM | POA: Diagnosis not present

## 2022-04-21 DIAGNOSIS — J439 Emphysema, unspecified: Secondary | ICD-10-CM | POA: Diagnosis not present

## 2022-04-21 DIAGNOSIS — R7989 Other specified abnormal findings of blood chemistry: Secondary | ICD-10-CM | POA: Diagnosis not present

## 2022-04-21 DIAGNOSIS — M75101 Unspecified rotator cuff tear or rupture of right shoulder, not specified as traumatic: Secondary | ICD-10-CM | POA: Diagnosis not present

## 2022-04-21 DIAGNOSIS — N209 Urinary calculus, unspecified: Secondary | ICD-10-CM | POA: Diagnosis not present

## 2022-04-21 DIAGNOSIS — F17210 Nicotine dependence, cigarettes, uncomplicated: Secondary | ICD-10-CM | POA: Diagnosis not present

## 2022-04-21 DIAGNOSIS — Z681 Body mass index (BMI) 19 or less, adult: Secondary | ICD-10-CM | POA: Diagnosis not present

## 2022-04-21 DIAGNOSIS — Z978 Presence of other specified devices: Secondary | ICD-10-CM | POA: Diagnosis not present

## 2022-04-21 DIAGNOSIS — I700 Atherosclerosis of aorta: Secondary | ICD-10-CM | POA: Diagnosis not present

## 2022-04-28 DIAGNOSIS — J439 Emphysema, unspecified: Secondary | ICD-10-CM | POA: Diagnosis not present

## 2022-05-05 DIAGNOSIS — Z01810 Encounter for preprocedural cardiovascular examination: Secondary | ICD-10-CM | POA: Diagnosis not present

## 2022-05-05 DIAGNOSIS — J439 Emphysema, unspecified: Secondary | ICD-10-CM | POA: Diagnosis not present

## 2022-05-05 DIAGNOSIS — R0609 Other forms of dyspnea: Secondary | ICD-10-CM | POA: Diagnosis not present

## 2022-05-05 DIAGNOSIS — F17210 Nicotine dependence, cigarettes, uncomplicated: Secondary | ICD-10-CM | POA: Diagnosis not present

## 2022-05-27 DIAGNOSIS — M12811 Other specific arthropathies, not elsewhere classified, right shoulder: Secondary | ICD-10-CM | POA: Diagnosis not present

## 2022-07-16 DIAGNOSIS — R030 Elevated blood-pressure reading, without diagnosis of hypertension: Secondary | ICD-10-CM | POA: Diagnosis not present

## 2022-07-16 DIAGNOSIS — F17210 Nicotine dependence, cigarettes, uncomplicated: Secondary | ICD-10-CM | POA: Diagnosis not present

## 2022-07-16 DIAGNOSIS — Z681 Body mass index (BMI) 19 or less, adult: Secondary | ICD-10-CM | POA: Diagnosis not present

## 2022-07-16 DIAGNOSIS — M75101 Unspecified rotator cuff tear or rupture of right shoulder, not specified as traumatic: Secondary | ICD-10-CM | POA: Diagnosis not present

## 2022-07-25 DIAGNOSIS — R29898 Other symptoms and signs involving the musculoskeletal system: Secondary | ICD-10-CM | POA: Diagnosis not present

## 2022-07-25 DIAGNOSIS — Z043 Encounter for examination and observation following other accident: Secondary | ICD-10-CM | POA: Diagnosis not present

## 2022-07-25 DIAGNOSIS — S3993XA Unspecified injury of pelvis, initial encounter: Secondary | ICD-10-CM | POA: Diagnosis not present

## 2022-07-25 DIAGNOSIS — Z960 Presence of urogenital implants: Secondary | ICD-10-CM | POA: Diagnosis not present

## 2022-07-25 DIAGNOSIS — F17210 Nicotine dependence, cigarettes, uncomplicated: Secondary | ICD-10-CM | POA: Diagnosis not present

## 2022-07-25 DIAGNOSIS — G459 Transient cerebral ischemic attack, unspecified: Secondary | ICD-10-CM | POA: Diagnosis not present

## 2022-07-25 DIAGNOSIS — Z8619 Personal history of other infectious and parasitic diseases: Secondary | ICD-10-CM | POA: Diagnosis not present

## 2022-07-25 DIAGNOSIS — T83511A Infection and inflammatory reaction due to indwelling urethral catheter, initial encounter: Secondary | ICD-10-CM | POA: Diagnosis not present

## 2022-07-25 DIAGNOSIS — T83511D Infection and inflammatory reaction due to indwelling urethral catheter, subsequent encounter: Secondary | ICD-10-CM | POA: Diagnosis not present

## 2022-07-25 DIAGNOSIS — Y846 Urinary catheterization as the cause of abnormal reaction of the patient, or of later complication, without mention of misadventure at the time of the procedure: Secondary | ICD-10-CM | POA: Diagnosis not present

## 2022-07-25 DIAGNOSIS — W0110XA Fall on same level from slipping, tripping and stumbling with subsequent striking against unspecified object, initial encounter: Secondary | ICD-10-CM | POA: Diagnosis not present

## 2022-07-25 DIAGNOSIS — N390 Urinary tract infection, site not specified: Secondary | ICD-10-CM | POA: Diagnosis not present

## 2022-07-25 DIAGNOSIS — I10 Essential (primary) hypertension: Secondary | ICD-10-CM | POA: Insufficient documentation

## 2022-07-25 DIAGNOSIS — R531 Weakness: Secondary | ICD-10-CM | POA: Diagnosis not present

## 2022-07-25 DIAGNOSIS — Z79899 Other long term (current) drug therapy: Secondary | ICD-10-CM | POA: Diagnosis not present

## 2022-07-25 DIAGNOSIS — Z792 Long term (current) use of antibiotics: Secondary | ICD-10-CM | POA: Diagnosis not present

## 2022-07-25 DIAGNOSIS — S0990XA Unspecified injury of head, initial encounter: Secondary | ICD-10-CM | POA: Diagnosis not present

## 2022-07-25 DIAGNOSIS — R296 Repeated falls: Secondary | ICD-10-CM | POA: Diagnosis not present

## 2022-07-25 DIAGNOSIS — R42 Dizziness and giddiness: Secondary | ICD-10-CM | POA: Diagnosis not present

## 2022-07-25 DIAGNOSIS — B952 Enterococcus as the cause of diseases classified elsewhere: Secondary | ICD-10-CM | POA: Diagnosis not present

## 2022-07-25 DIAGNOSIS — N179 Acute kidney failure, unspecified: Secondary | ICD-10-CM | POA: Diagnosis not present

## 2022-08-06 ENCOUNTER — Telehealth: Payer: Self-pay

## 2022-08-06 DIAGNOSIS — N179 Acute kidney failure, unspecified: Secondary | ICD-10-CM

## 2022-08-07 ENCOUNTER — Telehealth: Payer: Self-pay | Admitting: *Deleted

## 2022-08-07 NOTE — Progress Notes (Unsigned)
  Care Coordination  Outreach Note  08/07/2022 Name: Ian Boyd MRN: [id number] DOB: 25-Mar-1950   Care Coordination Outreach Attempts: An unsuccessful telephone outreach was attempted today to offer the patient information about available care coordination services.  Follow Up Plan:  Additional outreach attempts will be made to offer the patient care coordination information and services.   Encounter Outcome:  No Answer  Christie Nottingham  Care Coordination Care Guide  Direct Dial: [phone]

## 2022-08-10 NOTE — Progress Notes (Unsigned)
  Care Coordination  Outreach Note  08/10/2022 Name: Ian Boyd MRN: [id number] DOB: May 27, 1950   Care Coordination Outreach Attempts: A second unsuccessful outreach was attempted today to offer the patient with information about available care coordination services.  Follow Up Plan:  Additional outreach attempts will be made to offer the patient care coordination information and services.   Encounter Outcome:  No Answer  Christie Nottingham  Care Coordination Care Guide  Direct Dial: [phone]

## 2022-08-11 ENCOUNTER — Telehealth: Payer: Self-pay | Admitting: *Deleted

## 2022-08-11 NOTE — Progress Notes (Signed)
  Care Coordination   Note   08/11/2022 Name: Ian Boyd MRN: [id number] DOB: 06/21/1950  Ian Boyd is a 72 y.o. year old male who sees Burdine, Ananias Pilgrim, MD for primary care. I reached out to Marica Otter by phone today to offer care coordination services.  [name] was given information about Care Coordination services today including:   The Care Coordination services include support from the care team which includes your Nurse Coordinator, Clinical Social Worker, or Pharmacist.  The Care Coordination team is here to help remove barriers to the health concerns and goals most important to you. Care Coordination services are voluntary, and the patient may decline or stop services at any time by request to their care team member.   Care Coordination Consent Status: Patient agreed to services and verbal consent obtained.   Follow up plan:  Telephone appointment with care coordination team member scheduled for:  08/12/22 SW and 5/29 RNCC  Encounter Outcome:  Pt. Scheduled  [facility]  Care Coordination Care Guide  Direct Dial: [phone]

## 2022-08-11 NOTE — Progress Notes (Signed)
  Care Coordination  Outreach Note  08/11/2022 Name: Ian Boyd MRN: [id number] DOB: 05-28-1950   Care Coordination Outreach Attempts: A third unsuccessful outreach was attempted today to offer the patient with information about available care coordination services.  Follow Up Plan:  No further outreach attempts will be made at this time. We have been unable to contact the patient to offer or enroll patient in care coordination services  Encounter Outcome:  No Answer  Christie Nottingham  Care Coordination Care Guide  Direct Dial: [phone]

## 2022-08-12 ENCOUNTER — Ambulatory Visit: Payer: Self-pay

## 2022-08-12 DIAGNOSIS — M9952 Intervertebral disc stenosis of neural canal of thoracic region: Secondary | ICD-10-CM | POA: Diagnosis not present

## 2022-08-12 DIAGNOSIS — M9953 Intervertebral disc stenosis of neural canal of lumbar region: Secondary | ICD-10-CM | POA: Diagnosis not present

## 2022-08-12 DIAGNOSIS — R29898 Other symptoms and signs involving the musculoskeletal system: Secondary | ICD-10-CM | POA: Diagnosis not present

## 2022-08-12 DIAGNOSIS — M545 Low back pain, unspecified: Secondary | ICD-10-CM | POA: Diagnosis not present

## 2022-08-12 NOTE — Patient Outreach (Signed)
  Care Coordination   Initial Visit Note   08/12/2022 Name: Ian Boyd MRN: [id number] DOB: [birth year]  Ian Boyd is a 72 y.o. year old male who sees Ian Boyd, Ian Pilgrim, MD for primary care. I spoke with  Ian Boyd by phone today.  What matters to the patients health and wellness today?  Patient is interested in resources to assist with housing    Goals Addressed             This Visit's Progress    Care Coordination Activities       Care Coordination Interventions: SDo[facility] screening performed - noted acute challenges with housing insecurity Determined the patient and his son live in the same rental home. Patient was given a 60 day notice that he would need to move out by June 15th as the landlord plans to sell the home. Patient reports he has not yet located an option for housing Discussed patients sister drove him to several different low income apartment complex's - patient reports all had waiting lists. He did not choose to join any wait lists Patient reports due to his income level he is having a hard time locating an affordable option that will accept his income level Discussed the patients son and his sons friend are planning to look at a 3 bedroom hours today as they have discussed all moving in together. Patient is unsure if this will work out due to income of all three parties involved Assessed for patients interest in looking into adult care homes or assisted living facilities - patient reports he would be interested if he is appropriate for the level of care Advised the patient to call or visit his local DSS to request assistance with a placement worker; patient stated understanding         SDOH assessments and interventions completed:  Yes  SDOH Interventions Today    Flowsheet Row Most Recent Value  SDOH Interventions   Food Insecurity Interventions Intervention Not Indicated  Housing Interventions Other (Comment)  [Instructed the patient to  contact DSS to request assistance from a placement worker]  Transportation Interventions Intervention Not Indicated        Care Coordination Interventions:  Yes, provided   Interventions Today    Flowsheet Row Most Recent Value  Chronic Disease   Chronic disease during today's visit Other  General Interventions   General Interventions Discussed/Reviewed General Interventions Discussed, Community Resources        Follow up plan:  SW will follow up with the patient over the next two days to assess outcome of call to DSS    Encounter Outcome:  Pt. Visit Completed   Bevelyn Ngo, Kenard Gower, CDP Social Worker, Certified Dementia Practitioner [facility] Care Management  Care Coordination [phone]

## 2022-08-12 NOTE — Patient Instructions (Signed)
Visit Information  Thank you for taking time to visit with me today. Please don't hesitate to contact me if I can be of assistance to you.   Following are the goals we discussed today:  -Call or visit your local DSS regarding placement if applicable -Work with son to identify housing options you can afford    If you are experiencing a Mental Health or Behavioral Health Crisis or need someone to talk to, please call the [facility]: [phone]  The patient verbalized understanding of instructions, educational materials, and care plan provided today and DECLINED offer to receive copy of patient instructions, educational materials, and care plan.   Bevelyn Ngo, BSW, CDP Social Worker, Certified Dementia Practitioner [facility] Care Management  Care Coordination [phone]

## 2022-08-18 ENCOUNTER — Ambulatory Visit: Payer: Self-pay

## 2022-08-18 NOTE — Patient Instructions (Signed)
Visit Information  Thank you for taking time to visit with me today. Please don't hesitate to contact me if I can be of assistance to you.   Following are the goals we discussed today:  - Continue working with your son to find a place to live Oceanographer your primary care provider as needed   If you are experiencing a Mental Health or Behavioral Health Crisis or need someone to talk to, please call the [facility]: [phone]  The patient verbalized understanding of instructions, educational materials, and care plan provided today and DECLINED offer to receive copy of patient instructions, educational materials, and care plan.   No further follow up required: Please contact the care coordination team as needed.  Bevelyn Ngo, BSW, CDP Social Worker, Certified Dementia Practitioner [facility] Care Management  Care Coordination [phone]

## 2022-08-18 NOTE — Patient Outreach (Signed)
  Care Coordination   Follow Up Visit Note   08/18/2022 Name: QUAVIS BANDUCCI MRN: [id number] DOB: Jul 21, 1950  MANCEL KINTZEL is a 73 y.o. year old male who sees Burdine, Ananias Pilgrim, MD for primary care. I spoke with  Marica Otter by phone today.  What matters to the patients health and wellness today?  The patient is trying to find a new place to live.    Goals Addressed             This Visit's Progress    COMPLETED: Care Coordination Activities       Care Coordination Interventions: Determined the patient has not yet contacted DSS regarding options for placement  Education provided on placement options advising the patient he would lose all of his income except $40 per month - patient is not interested in placement at this time Discussed the patients son plans to pick him up today at 2pm to go look at a housing option; the patient also plans to go by a rental office to determine if properties are available Discussed plans for the patient to contact SW as needed        SDOH assessments and interventions completed:  No     Care Coordination Interventions:  Yes, provided   Interventions Today    Flowsheet Row Most Recent Value  Chronic Disease   Chronic disease during today's visit Other  General Interventions   General Interventions Discussed/Reviewed General Interventions Reviewed        Follow up plan: No further intervention required.   Encounter Outcome:  Pt. Visit Completed   Bevelyn Ngo, BSW, CDP Social Worker, Certified Dementia Practitioner [facility] Care Management  Care Coordination [phone]

## 2022-08-19 ENCOUNTER — Ambulatory Visit: Payer: Self-pay | Admitting: *Deleted

## 2022-08-19 ENCOUNTER — Encounter: Payer: Self-pay | Admitting: *Deleted

## 2022-08-19 DIAGNOSIS — Z5982 Transportation insecurity: Secondary | ICD-10-CM

## 2022-08-20 ENCOUNTER — Telehealth: Payer: Self-pay

## 2022-08-20 NOTE — Telephone Encounter (Signed)
   Telephone encounter was:  Successful.  08/20/2022 Name: Ian Boyd MRN: [id number] DOB: 1950-04-07  Ian Boyd is a 71 y.o. year old male who is a primary care patient of Burdine, Ananias Pilgrim, MD . The community resource team was consulted for assistance with Transportation Needs   Care guide performed the following interventions: Patient provided with information about care guide support team and interviewed to confirm resource needs.Patient has medicaid transportation and we are waiting for a call back so he can set the ride for his appointment. Patient will follow back up with me if she doesnt call him back.   Follow Up Plan:  Care guide will follow up with patient by phone over the next day    [facility] Guide, [facility] [phone] [address] [place], [place], [place] [address] Phone: [phone] Email: [email or web address]

## 2022-08-20 NOTE — Patient Outreach (Signed)
  Care Coordination   Initial Visit Note   08/19/2022 Name: KAORI ROEHL MRN: [id number] DOB: [birth year]  ARNET MICHAELSON is a 72 y.o. year old male who sees Burdine, Ananias Pilgrim, MD for primary care. I spoke with  Marica Otter by phone today.  What matters to the patients health and wellness today?  Scheduling a urology follow-up to change indwelling foley catheter. Patient reports that he has called [facility] and has been waiting for them to return his call to schedule hosp f/u and change catheter.     Goals Addressed             This Visit's Progress    Scheduling Follow-up with Urologist       Care Coordination Goals: Patient will keep all medical appointments and follow-up with urologist as scheduled and as needed Patient will work with [facility] Care Guides re: Urgent transportation referral Patient will seek emergency medical attention if needed Patient will take medications as prescribed Patient will reach out to [facility] [phone] with any resource or care coordination needs        SDOH assessments and interventions completed:  Yes  SDOH Interventions Today    Flowsheet Row Most Recent Value  SDOH Interventions   Transportation Interventions Patient Resources (Friends/Family), AMB Referral  [Patient relies on son for transportation but his son works and isn't always available to take him to appointments. Urgent referral placed for transortation assistance for appt with urology next week.]  Financial Strain Interventions Intervention Not Indicated        Care Coordination Interventions:  Yes, provided  Interventions Today    Flowsheet Row Most Recent Value  Chronic Disease   Chronic disease during today's visit Other  [Urinary retention with indwelling catheter]  General Interventions   General Interventions Discussed/Reviewed General Interventions Discussed, General Interventions Reviewed, Labs, Durable Medical Equipment (DME), Doctor Visits,  Communication with  Labs Kidney Function  [urinarlysis, urine culture]  Doctor Visits Discussed/Reviewed Doctor Visits Discussed, Doctor Visits Reviewed, PCP, Specialist  Durable Medical Equipment (DME) Other  [indwelling foley catheter]  PCP/Specialist Visits Compliance with follow-up visit  Communication with PCP/Specialists  [conference call with [facility] [phone] to schedule follow-up on foley cather removal and reinsertion. Communicated with care guide re: urgent transportation referral for appt with urology next week.]  Nutrition Interventions   Nutrition Discussed/Reviewed Nutrition Discussed, Nutrition Reviewed  Pharmacy Interventions   Pharmacy Dicussed/Reviewed Pharmacy Topics Discussed, Pharmacy Topics Reviewed, Medications and their functions  Safety Interventions   Safety Discussed/Reviewed Safety Discussed, Safety Reviewed       Follow up plan: Follow up call scheduled for 08/27/22    Encounter Outcome:  Pt. Visit Completed   Demetrios Loll, BSN, RN-BC RN Care Coordinator [facility]  [facility] Direct Dial: [phone] Main #: [phone]

## 2022-08-20 NOTE — Telephone Encounter (Signed)
   Telephone encounter was:  Successful.  08/20/2022 Name: [name] MRN: [id number] DOB: [birth year]  Ian Boyd is a 72 y.o. year old male who is a primary care patient of Burdine, Ananias Pilgrim, MD . The community resource team was consulted for assistance with Transportation Needs   Care guide performed the following interventions: Patient provided with information about care guide support team and interviewed to confirm resource needs.PT sister is going to take him to his appointment. Pt has no needs at this time   Follow Up Plan:  No further follow up planned at this time. The patient has been provided with needed resources.    Lenard Forth [facility] Guide, [facility] [phone] [address] [place], [place], [place] [address] Phone: [phone] Email: [email or web address]

## 2022-08-21 ENCOUNTER — Encounter: Payer: 59 | Admitting: *Deleted

## 2022-08-25 DIAGNOSIS — R3914 Feeling of incomplete bladder emptying: Secondary | ICD-10-CM | POA: Diagnosis not present

## 2022-08-26 DIAGNOSIS — I700 Atherosclerosis of aorta: Secondary | ICD-10-CM | POA: Diagnosis not present

## 2022-08-26 DIAGNOSIS — M5136 Other intervertebral disc degeneration, lumbar region: Secondary | ICD-10-CM | POA: Diagnosis not present

## 2022-08-26 DIAGNOSIS — N390 Urinary tract infection, site not specified: Secondary | ICD-10-CM | POA: Diagnosis not present

## 2022-08-26 DIAGNOSIS — T83511A Infection and inflammatory reaction due to indwelling urethral catheter, initial encounter: Secondary | ICD-10-CM | POA: Diagnosis not present

## 2022-08-26 DIAGNOSIS — R296 Repeated falls: Secondary | ICD-10-CM | POA: Diagnosis not present

## 2022-08-26 DIAGNOSIS — F17210 Nicotine dependence, cigarettes, uncomplicated: Secondary | ICD-10-CM | POA: Diagnosis not present

## 2022-08-26 DIAGNOSIS — N179 Acute kidney failure, unspecified: Secondary | ICD-10-CM | POA: Diagnosis not present

## 2022-08-26 DIAGNOSIS — J439 Emphysema, unspecified: Secondary | ICD-10-CM | POA: Diagnosis not present

## 2022-08-26 DIAGNOSIS — M75101 Unspecified rotator cuff tear or rupture of right shoulder, not specified as traumatic: Secondary | ICD-10-CM | POA: Diagnosis not present

## 2022-08-27 ENCOUNTER — Encounter: Payer: Self-pay | Admitting: *Deleted

## 2022-08-27 ENCOUNTER — Ambulatory Visit: Payer: Self-pay | Admitting: *Deleted

## 2022-08-27 NOTE — Patient Outreach (Signed)
  Care Coordination   Follow Up Visit Note   08/27/2022 Name: Ian Boyd MRN: [id number] DOB: 1950-10-01  Ian Boyd is a 72 y.o. year old male who sees Burdine, Ananias Pilgrim, MD for primary care. I spoke with  Marica Otter and his sister [name] by phone today.  What matters to the patients health and wellness today?  Finding housing and managing catheter    Goals Addressed             This Visit's Progress    Care Coordination Services       Care Coordination Goals Patient/sister will follow-up with LCSW re: housing resources Patient/sister will reach out to RN Care Coordinator [phone] with any resource or care coordination needs     Scheduling Follow-up with Urologist   On track    Care Coordination Goals: Patient will keep all medical appointments and follow-up with urologist as scheduled and as needed Patient was able to see urologist this week and had catheter replaced He has a follow-up appt scheduled for later this month Transportation provided by sister. She will be able to provide transportation to next appt as well Patient has been provided with transportation resource information if needed in the future Patient will take medications as prescribed Patient will reach out to [facility] [phone] with any resource or care coordination needs        SDOH assessments and interventions completed:  Yes    SDOH Interventions Today    Flowsheet Row Most Recent Value  SDOH Interventions   Housing Interventions Other (Comment)  [Patient to continue to review resource information provide by BSW. Scheduled with LCSW.]       Care Coordination Interventions:  Yes, provided  Interventions Today    Flowsheet Row Most Recent Value  Chronic Disease   Chronic disease during today's visit Other  [Urinary retention with indwelling catheter]  General Interventions   General Interventions Discussed/Reviewed General Interventions Discussed, General Interventions  Reviewed, Durable Medical Equipment (DME), Doctor Visits, Communication with  Labs Kidney Function  Doctor Visits Discussed/Reviewed Doctor Visits Discussed, Doctor Visits Reviewed, PCP, Specialist  Durable Medical Equipment (DME) Other  [indwelling foley catheter]  PCP/Specialist Visits Compliance with follow-up visit  Communication with Social Work  [employer] re: housing resource needs]  Exercise Interventions   Exercise Discussed/Reviewed Physical Activity  Physical Activity Discussed/Reviewed Physical Activity Discussed, Physical Activity Reviewed  Education Interventions   Education Provided Provided Education  Provided Verbal Education On Mental Health/Coping with Illness, Nutrition, When to see the doctor, Medication, Applications  [patient will continue to review housing resource information provided by BSW and will talk with LCSW next week]  Nutrition Interventions   Nutrition Discussed/Reviewed Nutrition Discussed, Nutrition Reviewed  Pharmacy Interventions   Pharmacy Dicussed/Reviewed Pharmacy Topics Discussed, Pharmacy Topics Reviewed  Safety Interventions   Safety Discussed/Reviewed Safety Discussed, Safety Reviewed       Follow up plan: Follow up call scheduled for 09/30/22    Encounter Outcome:  Pt. Visit Completed   Demetrios Loll, BSN, RN-BC RN Care Coordinator [facility]  [facility] Direct Dial: [phone] Main #: [phone]

## 2022-09-01 ENCOUNTER — Ambulatory Visit: Payer: Self-pay | Admitting: *Deleted

## 2022-09-02 ENCOUNTER — Encounter: Payer: Self-pay | Admitting: *Deleted

## 2022-09-02 NOTE — Patient Outreach (Signed)
Care Coordination   Initial Visit Note   09/02/2022 - Late Entry  Name: Ian Boyd MRN: [id number] DOB: 07/30/1950  Ian Boyd is a 72 y.o. year old male who sees Burdine, Ananias Pilgrim, MD for primary care. I spoke with Marica Otter and sister, [name] by phone today.  What matters to the patients health and wellness today?  Receive Assistance Obtaining Safe, Affordable and Permanent Housing.    Goals Addressed               This Visit's Progress     Receive Assistance Obtaining Safe, Affordable and Permanent Housing. (pt-stated)   On track     Care Coordination Interventions:  Interventions Today    Flowsheet Row Most Recent Value  Chronic Disease   Chronic disease during today's visit Other  [Indwelling Cathetar, Severe Sepsis without Septic Shock, Acute Kidney Injury, Cocaine Abuse & Homelessness]  General Interventions   General Interventions Discussed/Reviewed General Interventions Discussed, Labs, Vaccines, Doctor Visits, Health Screening, Referral to Nurse, General Interventions Reviewed, Annual Eye Exam, Durable Medical Equipment (DME), [employer], Level of Care, Communication with, Lipid Profile  [Encouraged]  Labs Kidney Function  [Encouraged]  Vaccines COVID-19, Flu, Pneumonia, RSV, Shingles, Tetanus/Pertussis/Diphtheria  [Encouraged]  Doctor Visits Discussed/Reviewed Doctor Visits Discussed, Specialist, Doctor Visits Reviewed, Annual Wellness Visits, PCP  [Encouraged]  Health Screening Bone Density, Colonoscopy, Prostate  [Encouraged]  Durable Medical Equipment (DME) Other  [Indwelling Foley Catheter]  PCP/Specialist Visits Compliance with follow-up visit  [Encouraged]  Communication with PCP/Specialists, RN  [Encouraged]  Level of Care Adult Daycare, Applications, Assisted Living, Personal Care Services  [Encouraged]  Applications Medicaid, Personal Care Services  [Encouraged]  Exercise Interventions   Exercise Discussed/Reviewed  Exercise Discussed, Assistive device use and maintanence, Exercise Reviewed, Physical Activity, Weight Managment  [Encouraged]  Physical Activity Discussed/Reviewed Physical Activity Discussed, Home Exercise Program (HEP), Physical Activity Reviewed, Types of exercise  [Encouraged]  Weight Management Weight maintenance  [Encouraged]  Education Interventions   Education Provided Provided Therapist, sports, Provided Web-based Education, Provided Education  [Encouraged]  Provided Verbal Education On Nutrition, Foot Care, Eye Care, Labs, Mental Health/Coping with Illness, When to see the doctor, [employer], [employer], Medication, Exercise, Applications  [Encouraged]  Applications Medicaid, Personal Care Services  [Encouraged]  Mental Health Interventions   Mental Health Discussed/Reviewed Mental Health Discussed, Anxiety, Depression, Mental Health Reviewed, Grief and Loss, Substance Abuse, Coping Strategies, Crisis, Other, Suicide  [Domestic Violence]  Nutrition Interventions   Nutrition Discussed/Reviewed Nutrition Discussed, Adding fruits and vegetables, Increasing proteins, Decreasing fats, Decreasing salt, Supplemental nutrition, Decreasing sugar intake, Portion sizes, Fluid intake, Nutrition Reviewed, Carbohydrate meal planning  [Encouraged]  Pharmacy Interventions   Pharmacy Dicussed/Reviewed Pharmacy Topics Discussed, Pharmacy Topics Reviewed, Medication Adherence, Affording Medications  [Encouraged]  Safety Interventions   Safety Discussed/Reviewed Safety Discussed, Safety Reviewed, Fall Risk, Home Safety  [Encouraged]  Home Safety Assistive Devices, Need for home safety assessment, Refer for community resources  [Encouraged]  Advanced Directive Interventions   Advanced Directives Discussed/Reviewed Advanced Directives Discussed  [Encouraged]     Assessed Social Determinant of Health Barriers. Discussed Plans for Ongoing Care Management Follow Up. Provided Careers information officer  Information for Care Management Team Members. Screened for Signs & Symptoms of Depression, Related to Chronic Disease State.  PHQ2 & PHQ9 Depression Screen Completed & Results Reviewed.  Suicidal Ideation & Homicidal Ideation Assessed - None Present.   Domestic Violence Assessed - None Present. Access to Weapons Assessed - None Present.   Active  Listening & Reflection Utilized.  Verbalization of Feelings Encouraged.  Emotional Support Provided. Caregiver Burnout & Fatigue Validated. Caregiver Stress Acknowledged. Caregiver Resources Reviewed. Caregiver Support Groups Mailed. Self-Enrollment in Caregiver Support Group of Interest Emphasized. Crisis Support Information, Agencies, Services & Resources Discussed. Problem Solving Interventions Identified. Task-Centered Solutions Implemented.   Solution-Focused Strategies Developed. Acceptance & Commitment Therapy Introduced. Brief Cognitive Behavioral Therapy Initiated. Client-Centered Therapy Enacted. Reviewed Prescription Medications & Discussed Importance of Compliance. Quality of Sleep Assessed & Sleep Hygiene Techniques Promoted. CSW Collaboration with Sister, [name] to Emphasize Importance of Quitting Smoking & Offered Smoking Cessation Classes, Services, Agencies & Resources. CSW Collaboration with Sister, [name] to Discuss Higher Level of Care Options (I.e. Assisted Living, Extended Care, Rest Home, Family Care, Group Home, Etc.) & Encouraged Consideration. CSW Collaboration with Sister, [name] to Discuss Transportation Benefits through [employer] Dual Complete [employer], [employer] (# [phone]) & Medicaid Transportation through [employer] of [facility] [phone]) & Encouraged Utilization.  CSW Collaboration with Sister, [name] to Review Insurance Provider Benefits through [employer] Dual Complete & Medicaid & Encouraged Consideration of  Applying for [employer], through [employer] [phone]). CSW Collaboration with Sister, [name] to Request Review of The Following List of [employer], [employer], [employer], Emailed on 09/02/2022: ~ Adult Day Care Programs  ~ In-Home Care & Respite Agencies ~ Home Health Care Agencies ~ Respite Care Agencies & Facilities ~ Personal Care Services Application  ~ Theatre manager Providers ~ Affordable Apartments & Rental Properties in [place] ~ [facility] [employer] ~ Low Income Housing in [place] ~ Holiday representative Housing in [place] ~ Housing Pension scheme manager ~ Section 8 Housing Application  CSW Collaboration with Sister, [name] to Control and instrumentation engineer with Health and safety inspector at [employer] in [place], [place] [phone]), Explaining Current Availability of Two Bedroom Apartments. CSW Collaboration with Sister, [name] to Control and instrumentation engineer with [employer], [employer] of Interest, in An Effort to Obtain Care & Supervision in [employer] CSW Collaboration with Sister, [name] to Encourage Completion of Application for Personal Care Services & Submission to [facility] [phone]) for Processing, If Agreeable. CSW Collaboration with Sister, [name] to [employer] with CSW [phone]), if She Has Questions, Needs Assistance, or If Additional Social Work Needs Are Identified Between Now & Our Next Scheduled Follow-Up Outreach Call.        SDOH assessments and interventions completed:  Yes.  SDOH Interventions Today    Flowsheet Row Most Recent Value  SDOH Interventions   Food Insecurity Interventions Intervention Not Indicated  Housing Interventions Other (Comment)  [Housing Resources Provided & Safe, Affordable and Permanent Housing Obtained]  Transportation  Interventions Other (Comment)  [Transportation Benefits Reviewed through [employer] Dual Complete & Medicaid]  Utilities Interventions Intervention Not Indicated  Alcohol Usage Interventions Intervention Not Indicated (Score <7)  Financial Strain Interventions Intervention Not Indicated  Physical Activity Interventions Intervention Not Indicated  Stress Interventions Intervention Not Indicated  Social Connections Interventions Intervention Not Indicated     Care Coordination Interventions:  Yes, provided.   Follow up plan: Follow up call scheduled for 09/08/2022 at 3:15 pm.   Encounter Outcome:  Pt. Visit Completed.   Danford Bad, BSW, MSW, LCSW  Licensed Restaurant manager, [facility]  Mailing [place] [address]  [address], [place], [place] [address] Physical Address-[address] [place], [place] [address] Toll Free Main # [phone] Fax # [phone] Cell # [phone] [email or web address]

## 2022-09-02 NOTE — Patient Instructions (Signed)
Visit Information  Thank you for taking time to visit with me today. Please don't hesitate to contact me if I can be of assistance to you.   Following are the goals we discussed today:   Goals Addressed               This Visit's Progress     Receive Assistance Obtaining Safe, Affordable and Permanent Housing. (pt-stated)   On track     Care Coordination Interventions:  Interventions Today    Flowsheet Row Most Recent Value  Chronic Disease   Chronic disease during today's visit Other  [Indwelling Cathetar, Severe Sepsis without Septic Shock, Acute Kidney Injury, Cocaine Abuse & Homelessness]  General Interventions   General Interventions Discussed/Reviewed General Interventions Discussed, Labs, Vaccines, Doctor Visits, Health Screening, Referral to Nurse, General Interventions Reviewed, Annual Eye Exam, Durable Medical Equipment (DME), [employer], Level of Care, Communication with, Lipid Profile  [Encouraged]  Labs Kidney Function  [Encouraged]  Vaccines COVID-19, Flu, Pneumonia, RSV, Shingles, Tetanus/Pertussis/Diphtheria  [Encouraged]  Doctor Visits Discussed/Reviewed Doctor Visits Discussed, Specialist, Doctor Visits Reviewed, Annual Wellness Visits, PCP  [Encouraged]  Health Screening Bone Density, Colonoscopy, Prostate  [Encouraged]  Durable Medical Equipment (DME) Other  [Indwelling Foley Catheter]  PCP/Specialist Visits Compliance with follow-up visit  [Encouraged]  Communication with PCP/Specialists, RN  [Encouraged]  Level of Care Adult Daycare, Applications, Assisted Living, Personal Care Services  [Encouraged]  Applications Medicaid, Personal Care Services  [Encouraged]  Exercise Interventions   Exercise Discussed/Reviewed Exercise Discussed, Assistive device use and maintanence, Exercise Reviewed, Physical Activity, Weight Managment  [Encouraged]  Physical Activity Discussed/Reviewed Physical Activity Discussed, Home Exercise Program (HEP), Physical Activity  Reviewed, Types of exercise  [Encouraged]  Weight Management Weight maintenance  [Encouraged]  Education Interventions   Education Provided Provided Therapist, sports, Provided Web-based Education, Provided Education  [Encouraged]  Provided Verbal Education On Nutrition, Foot Care, Eye Care, Labs, Mental Health/Coping with Illness, When to see the doctor, [employer], [employer], Medication, Exercise, Applications  [Encouraged]  Applications Medicaid, Personal Care Services  [Encouraged]  Mental Health Interventions   Mental Health Discussed/Reviewed Mental Health Discussed, Anxiety, Depression, Mental Health Reviewed, Grief and Loss, Substance Abuse, Coping Strategies, Crisis, Other, Suicide  [Domestic Violence]  Nutrition Interventions   Nutrition Discussed/Reviewed Nutrition Discussed, Adding fruits and vegetables, Increasing proteins, Decreasing fats, Decreasing salt, Supplemental nutrition, Decreasing sugar intake, Portion sizes, Fluid intake, Nutrition Reviewed, Carbohydrate meal planning  [Encouraged]  Pharmacy Interventions   Pharmacy Dicussed/Reviewed Pharmacy Topics Discussed, Pharmacy Topics Reviewed, Medication Adherence, Affording Medications  [Encouraged]  Safety Interventions   Safety Discussed/Reviewed Safety Discussed, Safety Reviewed, Fall Risk, Home Safety  [Encouraged]  Home Safety Assistive Devices, Need for home safety assessment, Refer for community resources  [Encouraged]  Advanced Directive Interventions   Advanced Directives Discussed/Reviewed Advanced Directives Discussed  [Encouraged]     Assessed Social Determinant of Health Barriers. Discussed Plans for Ongoing Care Management Follow Up. Provided Careers information officer Information for Care Management Team Members. Screened for Signs & Symptoms of Depression, Related to Chronic Disease State.  PHQ2 & PHQ9 Depression Screen Completed & Results Reviewed.  Suicidal Ideation & Homicidal Ideation Assessed - None  Present.   Domestic Violence Assessed - None Present. Access to Weapons Assessed - None Present.   Active Listening & Reflection Utilized.  Verbalization of Feelings Encouraged.  Emotional Support Provided. Caregiver Burnout & Fatigue Validated. Caregiver Stress Acknowledged. Caregiver Resources Reviewed. Caregiver Support Groups Mailed. Self-Enrollment in Caregiver Support Group of Interest Emphasized. Crisis Support Information,  Agencies, Services & Resources Discussed. Problem Solving Interventions Identified. Task-Centered Solutions Implemented.   Solution-Focused Strategies Developed. Acceptance & Commitment Therapy Introduced. Brief Cognitive Behavioral Therapy Initiated. Client-Centered Therapy Enacted. Reviewed Prescription Medications & Discussed Importance of Compliance. Quality of Sleep Assessed & Sleep Hygiene Techniques Promoted. CSW Collaboration with Sister, [name] to Emphasize Importance of Quitting Smoking & Offered Smoking Cessation Classes, Services, Agencies & Resources. CSW Collaboration with Sister, [name] to Discuss Higher Level of Care Options (I.e. Assisted Living, Extended Care, Rest Home, Family Care, Group Home, Etc.) & Encouraged Consideration. CSW Collaboration with Sister, [name] to Discuss Transportation Benefits through [employer] Dual Complete [employer], [employer] (# [phone]) & Medicaid Transportation through [employer] of [facility] [phone]) & Encouraged Utilization.  CSW Collaboration with Sister, [name] to Review Insurance Provider Benefits through [employer] Dual Complete & Medicaid & Encouraged Consideration of Applying for [employer], through [employer] [phone]). CSW Collaboration with Sister, [name] to Request Review of The Following List of [employer], [employer], [employer], Emailed on  09/02/2022: ~ Adult Day Care Programs  ~ In-Home Care & Respite Agencies ~ Home Health Care Agencies ~ Respite Care Agencies & Facilities ~ Personal Care Services Application  ~ Theatre manager Providers ~ Affordable Apartments & Rental Properties in [place] ~ [facility] [employer] ~ Low Income Housing in [place] ~ Holiday representative Housing in [place] ~ Housing Pension scheme manager ~ Section 8 Housing Application  CSW Collaboration with Sister, [name] to Control and instrumentation engineer with Health and safety inspector at [employer] in [place], [place] [phone]), Explaining Current Availability of Two Bedroom Apartments. CSW Collaboration with Sister, [name] to Control and instrumentation engineer with [employer], [employer] of Interest, in An Effort to Obtain Care & Supervision in [employer] CSW Collaboration with Sister, [name] to Encourage Completion of Application for Personal Care Services & Submission to [facility] [phone]) for Processing, If Agreeable. CSW Collaboration with Sister, [name] to [employer] with CSW [phone]), if She Has Questions, Needs Assistance, or If Additional Social Work Needs Are Identified Between Now & Our Next Scheduled Follow-Up Outreach Call.      Our next appointment is by telephone on 09/08/2022 at 3:15 pm.   Please call the care guide team at [phone] if you need to cancel or reschedule your appointment.   If you are experiencing a Mental Health or Behavioral Health Crisis or need someone to talk to, please call the Suicide and Crisis Lifeline: 988 call the [place] National Suicide Prevention Lifeline: 503-518-2467 or TTY: 864 814 1720 TTY (365) 165-8919) to talk to a trained counselor call 1-800-273-TALK (toll free, 24 hour hotline) go to [facility] [address]  [place] [phone]) call the [facility] Crisis Line: [phone] call 911  Patient verbalizes understanding of instructions and care plan provided today and agrees to view in MyChart. Active MyChart status and patient understanding of how to access instructions and care plan via MyChart confirmed with patient.     Telephone follow up appointment with care management team member scheduled for:  09/08/2022 at 3:15 pm.   Danford Bad, BSW, MSW, LCSW  Licensed Clinical Social Worker  [facility]  Mailing [place]. [address] [place], [place] [address] Physical Address-[address], [place], [place] [address] Toll Free Main # [phone]  Fax # [phone] Cell # [phone] [email or web address]

## 2022-09-08 ENCOUNTER — Ambulatory Visit: Payer: Self-pay | Admitting: *Deleted

## 2022-09-08 ENCOUNTER — Encounter: Payer: Self-pay | Admitting: *Deleted

## 2022-09-08 NOTE — Patient Outreach (Signed)
Care Coordination   Follow Up Visit Note   09/08/2022  Name: Ian Boyd MRN: [id number] DOB: 1951/03/20  Ian Boyd is a 72 y.o. year old male who sees Burdine, Ananias Pilgrim, MD for primary care. I spoke with patient's sister, Ian Boyd by phone today.  What matters to the patients health and wellness today?  Receive Assistance Obtaining Safe, Affordable and Permanent Housing.   Goals Addressed               This Visit's Progress     Receive Assistance Obtaining Safe, Affordable and Permanent Housing. (pt-stated)   On track     Care Coordination Interventions:  Interventions Today    Flowsheet Row Most Recent Value  Chronic Disease   Chronic disease during today's visit Other  [Indwelling Cathetar, Severe Sepsis without Septic Shock, Acute Kidney Injury, Cocaine Abuse & Homelessness]  General Interventions   General Interventions Discussed/Reviewed General Interventions Discussed, Labs, Vaccines, Doctor Visits, Health Screening, Referral to Nurse, General Interventions Reviewed, Annual Eye Exam, Durable Medical Equipment (DME), [employer], Level of Care, Communication with, Lipid Profile  [Encouraged]  Labs Kidney Function  [Encouraged]  Vaccines COVID-19, Flu, Pneumonia, RSV, Shingles, Tetanus/Pertussis/Diphtheria  [Encouraged]  Doctor Visits Discussed/Reviewed Doctor Visits Discussed, Specialist, Doctor Visits Reviewed, Annual Wellness Visits, PCP  [Encouraged]  Health Screening Bone Density, Colonoscopy, Prostate  [Encouraged]  Durable Medical Equipment (DME) Other  [Indwelling Foley Catheter]  PCP/Specialist Visits Compliance with follow-up visit  [Encouraged]  Communication with PCP/Specialists, RN  [Encouraged]  Level of Care Adult Daycare, Applications, Assisted Living, Personal Care Services  [Encouraged]  Applications Medicaid, Personal Care Services  [Encouraged]  Exercise Interventions   Exercise Discussed/Reviewed Exercise Discussed,  Assistive device use and maintanence, Exercise Reviewed, Physical Activity, Weight Managment  [Encouraged]  Physical Activity Discussed/Reviewed Physical Activity Discussed, Home Exercise Program (HEP), Physical Activity Reviewed, Types of exercise  [Encouraged]  Weight Management Weight maintenance  [Encouraged]  Education Interventions   Education Provided Provided Therapist, sports, Provided Web-based Education, Provided Education  [Encouraged]  Provided Verbal Education On Nutrition, Foot Care, Eye Care, Labs, Mental Health/Coping with Illness, When to see the doctor, [employer], [employer], Medication, Exercise, Applications  [Encouraged]  Applications Medicaid, Personal Care Services  [Encouraged]  Mental Health Interventions   Mental Health Discussed/Reviewed Mental Health Discussed, Anxiety, Depression, Mental Health Reviewed, Grief and Loss, Substance Abuse, Coping Strategies, Crisis, Other, Suicide  [Domestic Violence]  Nutrition Interventions   Nutrition Discussed/Reviewed Nutrition Discussed, Adding fruits and vegetables, Increasing proteins, Decreasing fats, Decreasing salt, Supplemental nutrition, Decreasing sugar intake, Portion sizes, Fluid intake, Nutrition Reviewed, Carbohydrate meal planning  [Encouraged]  Pharmacy Interventions   Pharmacy Dicussed/Reviewed Pharmacy Topics Discussed, Pharmacy Topics Reviewed, Medication Adherence, Affording Medications  [Encouraged]  Safety Interventions   Safety Discussed/Reviewed Safety Discussed, Safety Reviewed, Fall Risk, Home Safety  [Encouraged]  Home Safety Assistive Devices, Need for home safety assessment, Refer for community resources  [Encouraged]  Advanced Directive Interventions   Advanced Directives Discussed/Reviewed Advanced Directives Discussed  [Encouraged]     Active Listening & Reflection Utilized.  Verbalization of Feelings Encouraged.  Emotional Support Provided. Caregiver Burnout, Stress & Fatigue  Validated. Symptoms of Anxiety Acknowledged. Caregiver Resources Revisited. Caregiver Support Groups Reviewed. Crisis Support Information, Agencies, Services & Resources Discussed. Problem Solving Interventions Activated. Task-Centered Solutions Implemented.   Solution-Focused Strategies Employed. Acceptance & Commitment Therapy Performed. Cognitive Behavioral Therapy Initiated. Client-Centered Therapy Indicated. CSW Collaboration with Sister, Ian Boyd to Review List of Smoking Cessation Classes, Services, Agencies &  Resources Provided & Encouraged Patient's Self-Enrollment. CSW Collaboration with Sister, Ian Boyd to Confirm Patient's Disinterest in Pursuing Higher Level of Care Options. CSW Collaboration with Sister, Ian Boyd to Encourage Patient's Use of [employer], through [employer] Dual Complete [employer], [employer] (# [phone]) & [employer], through The [employer] of [facility] [phone]). CSW Collaboration with Sister, Ian Boyd to Confirm Patient's Disinterest in Applying for Personal Care Services, through [facility] [phone]). CSW Collaboration with Sister, Ian Boyd to Confirm Receipt & Thoroughly Review the Following List of [employer], Walt Disney, Wellsite geologist, to [employer] & Entertain Questions: ~ Adult Day Care Programs  ~ In-Home Care & Respite Agencies ~ Home Health Care Agencies ~ Respite Care Agencies & Facilities ~ Merchant navy officer      ~ Theatre manager Providers ~ Affordable Apartments & Rental Properties in [place] ~ [facility] [employer] ~ Low Income Housing in [place] ~ Holiday representative Housing in [place]     ~ Housing Pension scheme manager ~ Section 8 Housing Application  CSW Collaboration with Sister, Ian Boyd to Control and instrumentation engineer  with [employer], Services & Resources of Interest, in An Effort to Obtain Care & Supervision for Patient in The Home [employer]. CSW Collaboration with Sister, Ian Boyd to Discuss Patient's Interest in Moving Into Two Bedroom Apartment with Son, Ian Boyd & Confirm Move-In Process is Currently Underway.   CSW Collaboration with Sister, Ian Boyd to [employer] with CSW [phone]), if She Has Questions, Needs Assistance, or If Additional Social Work Needs Are Identified Between Now & Our Next Scheduled Follow-Up Outreach Call.      SDOH assessments and interventions completed:  Yes.  Care Coordination Interventions:  Yes, provided.   Follow up plan: Follow up call scheduled for 09/22/2022 at 1:00 pm.  Encounter Outcome:  Pt. Visit Completed.   Danford Bad, BSW, MSW, LCSW  Licensed Restaurant manager, [facility]  Mailing [address] [address], [place], [place] [address] Physical Address-[address], [place], [place] [address] Toll Free Main # [phone] Fax # [phone] Cell # [phone] [email or web address]

## 2022-09-08 NOTE — Patient Instructions (Signed)
Visit Information  Thank you for taking time to visit with me today. Please don't hesitate to contact me if I can be of assistance to you.   Following are the goals we discussed today:   Goals Addressed               This Visit's Progress     Receive Assistance Obtaining Safe, Affordable and Permanent Housing. (pt-stated)   On track     Care Coordination Interventions:  Interventions Today    Flowsheet Row Most Recent Value  Chronic Disease   Chronic disease during today's visit Other  [Indwelling Cathetar, Severe Sepsis without Septic Shock, Acute Kidney Injury, Cocaine Abuse & Homelessness]  General Interventions   General Interventions Discussed/Reviewed General Interventions Discussed, Labs, Vaccines, Doctor Visits, Health Screening, Referral to Nurse, General Interventions Reviewed, Annual Eye Exam, Durable Medical Equipment (DME), [employer], Level of Care, Communication with, Lipid Profile  [Encouraged]  Labs Kidney Function  [Encouraged]  Vaccines COVID-19, Flu, Pneumonia, RSV, Shingles, Tetanus/Pertussis/Diphtheria  [Encouraged]  Doctor Visits Discussed/Reviewed Doctor Visits Discussed, Specialist, Doctor Visits Reviewed, Annual Wellness Visits, PCP  [Encouraged]  Health Screening Bone Density, Colonoscopy, Prostate  [Encouraged]  Durable Medical Equipment (DME) Other  [Indwelling Foley Catheter]  PCP/Specialist Visits Compliance with follow-up visit  [Encouraged]  Communication with PCP/Specialists, RN  [Encouraged]  Level of Care Adult Daycare, Applications, Assisted Living, Personal Care Services  [Encouraged]  Applications Medicaid, Personal Care Services  [Encouraged]  Exercise Interventions   Exercise Discussed/Reviewed Exercise Discussed, Assistive device use and maintanence, Exercise Reviewed, Physical Activity, Weight Managment  [Encouraged]  Physical Activity Discussed/Reviewed Physical Activity Discussed, Home Exercise Program (HEP), Physical Activity  Reviewed, Types of exercise  [Encouraged]  Weight Management Weight maintenance  [Encouraged]  Education Interventions   Education Provided Provided Therapist, sports, Provided Web-based Education, Provided Education  [Encouraged]  Provided Verbal Education On Nutrition, Foot Care, Eye Care, Labs, Mental Health/Coping with Illness, When to see the doctor, [employer], [employer], Medication, Exercise, Applications  [Encouraged]  Applications Medicaid, Personal Care Services  [Encouraged]  Mental Health Interventions   Mental Health Discussed/Reviewed Mental Health Discussed, Anxiety, Depression, Mental Health Reviewed, Grief and Loss, Substance Abuse, Coping Strategies, Crisis, Other, Suicide  [Domestic Violence]  Nutrition Interventions   Nutrition Discussed/Reviewed Nutrition Discussed, Adding fruits and vegetables, Increasing proteins, Decreasing fats, Decreasing salt, Supplemental nutrition, Decreasing sugar intake, Portion sizes, Fluid intake, Nutrition Reviewed, Carbohydrate meal planning  [Encouraged]  Pharmacy Interventions   Pharmacy Dicussed/Reviewed Pharmacy Topics Discussed, Pharmacy Topics Reviewed, Medication Adherence, Affording Medications  [Encouraged]  Safety Interventions   Safety Discussed/Reviewed Safety Discussed, Safety Reviewed, Fall Risk, Home Safety  [Encouraged]  Home Safety Assistive Devices, Need for home safety assessment, Refer for community resources  [Encouraged]  Advanced Directive Interventions   Advanced Directives Discussed/Reviewed Advanced Directives Discussed  [Encouraged]     Active Listening & Reflection Utilized.  Verbalization of Feelings Encouraged.  Emotional Support Provided. Caregiver Burnout, Stress & Fatigue Validated. Symptoms of Anxiety Acknowledged. Caregiver Resources Revisited. Caregiver Support Groups Reviewed. Crisis Support Information, Agencies, Services & Resources Discussed. Problem Solving Interventions  Activated. Task-Centered Solutions Implemented.   Solution-Focused Strategies Employed. Acceptance & Commitment Therapy Performed. Cognitive Behavioral Therapy Initiated. Client-Centered Therapy Indicated. CSW Collaboration with Sister, Ian Boyd to Review List of Smoking Cessation Classes, Services, Agencies & Resources Provided & Encouraged Patient's Self-Enrollment. CSW Collaboration with Sister, Ian Boyd to Confirm Patient's Disinterest in Pursuing Higher Level of Care Options. CSW Collaboration with Sister, Ian Boyd to Encourage Patient's Use  of [employer], through [employer] Dual Complete [employer], [employer] (# [phone]) & [employer], through The [employer] of [facility] [phone]). CSW Collaboration with Sister, Ian Boyd to Confirm Patient's Disinterest in Applying for Personal Care Services, through [facility] [phone]). CSW Collaboration with Sister, Ian Boyd to Confirm Receipt & Thoroughly Review the Following List of [employer], [employer], Wellsite geologist, to [employer] & Entertain Questions: ~ Adult Day Care Programs  ~ In-Home Care & Respite Agencies ~ Home Health Care Agencies ~ Respite Care Agencies & Facilities ~ Merchant navy officer  ~ Theatre manager Providers ~ Affordable Apartments & Rental Properties in [place] ~ [facility] [employer] ~ Low Income Housing in [place] ~ Holiday representative Housing in [place]     ~ Housing Pension scheme manager ~ Section 8 Housing Application  CSW Collaboration with Sister, Ian Boyd to Control and instrumentation engineer with [employer], Services & Resources of Interest, in An Effort to Obtain Care & Supervision for Patient in The Home [employer]. CSW Collaboration with Sister, Ian Boyd to Discuss Patient's Interest in  Moving Into Two Bedroom Apartment with Son, Ian Boyd & Confirm Move-In Process is Currently Underway.   CSW Collaboration with Sister, Ian Boyd to [employer] with CSW [phone]), if She Has Questions, Needs Assistance, or If Additional Social Work Needs Are Identified Between Now & Our Next Scheduled Follow-Up Outreach Call.      Our next appointment is by telephone on 09/22/2022 at 1:00 pm.  Please call the care guide team at [phone] if you need to cancel or reschedule your appointment.   If you are experiencing a Mental Health or Behavioral Health Crisis or need someone to talk to, please call the Suicide and Crisis Lifeline: 988 call the [place] National Suicide Prevention Lifeline: 520 868 1597 or TTY: 610 184 1984 TTY 310-591-5074) to talk to a trained counselor call 1-800-273-TALK (toll free, 24 hour hotline) go to [facility] Urgent Care [address], [place] [phone]) call the [facility] Crisis Line: [phone] call 911  Patient verbalizes understanding of instructions and care plan provided today and agrees to view in MyChart. Active MyChart status and patient understanding of how to access instructions and care plan via MyChart confirmed with patient.     Telephone follow up appointment with care management team member scheduled for:  09/22/2022 at 1:00 pm.  Ian Boyd, BSW, MSW, LCSW  Licensed Clinical Social Worker  [facility]  Mailing [place]. [address] [place], [place] [address] Physical Address-[address] [place], [place] [address] Toll Free Main # [phone] Fax # [phone] Cell # [phone] [email or web address]

## 2022-09-10 DIAGNOSIS — F17210 Nicotine dependence, cigarettes, uncomplicated: Secondary | ICD-10-CM | POA: Diagnosis not present

## 2022-09-10 DIAGNOSIS — R2681 Unsteadiness on feet: Secondary | ICD-10-CM | POA: Diagnosis not present

## 2022-09-10 DIAGNOSIS — R030 Elevated blood-pressure reading, without diagnosis of hypertension: Secondary | ICD-10-CM | POA: Diagnosis not present

## 2022-09-10 DIAGNOSIS — R296 Repeated falls: Secondary | ICD-10-CM | POA: Diagnosis not present

## 2022-09-10 DIAGNOSIS — N390 Urinary tract infection, site not specified: Secondary | ICD-10-CM | POA: Diagnosis not present

## 2022-09-10 DIAGNOSIS — N179 Acute kidney failure, unspecified: Secondary | ICD-10-CM | POA: Diagnosis not present

## 2022-09-10 DIAGNOSIS — I700 Atherosclerosis of aorta: Secondary | ICD-10-CM | POA: Diagnosis not present

## 2022-09-10 DIAGNOSIS — T83511A Infection and inflammatory reaction due to indwelling urethral catheter, initial encounter: Secondary | ICD-10-CM | POA: Diagnosis not present

## 2022-09-10 DIAGNOSIS — M75101 Unspecified rotator cuff tear or rupture of right shoulder, not specified as traumatic: Secondary | ICD-10-CM | POA: Diagnosis not present

## 2022-09-10 DIAGNOSIS — J439 Emphysema, unspecified: Secondary | ICD-10-CM | POA: Diagnosis not present

## 2022-09-22 ENCOUNTER — Ambulatory Visit: Payer: Self-pay | Admitting: *Deleted

## 2022-09-22 ENCOUNTER — Encounter: Payer: Self-pay | Admitting: *Deleted

## 2022-09-22 NOTE — Patient Instructions (Signed)
Visit Information  Thank you for taking time to visit with me today. Please don't hesitate to contact me if I can be of assistance to you.   Following are the goals we discussed today:   Goals Addressed               This Visit's Progress     Receive Assistance Obtaining Safe, Affordable and Permanent Housing. (pt-stated)   On track     Care Coordination Interventions:  Interventions Today    Flowsheet Row Most Recent Value  Chronic Disease   Chronic disease during today's visit Other  [Indwelling Cathetar, Severe Sepsis without Septic Shock, Acute Kidney Injury, Cocaine Abuse & Homelessness]  General Interventions   General Interventions Discussed/Reviewed General Interventions Discussed, Labs, Vaccines, Doctor Visits, Health Screening, Referral to Nurse, General Interventions Reviewed, Annual Eye Exam, Durable Medical Equipment (DME), [employer], Level of Care, Communication with, Lipid Profile  [Encouraged]  Labs Kidney Function  [Encouraged]  Vaccines COVID-19, Flu, Pneumonia, RSV, Shingles, Tetanus/Pertussis/Diphtheria  [Encouraged]  Doctor Visits Discussed/Reviewed Doctor Visits Discussed, Specialist, Doctor Visits Reviewed, Annual Wellness Visits, PCP  [Encouraged]  Health Screening Bone Density, Colonoscopy, Prostate  [Encouraged]  Durable Medical Equipment (DME) Other  [Indwelling Foley Catheter]  PCP/Specialist Visits Compliance with follow-up visit  [Encouraged]  Communication with PCP/Specialists, RN  [Encouraged]  Level of Care Adult Daycare, Applications, Assisted Living, Personal Care Services  [Encouraged]  Applications Medicaid, Personal Care Services  [Encouraged]  Exercise Interventions   Exercise Discussed/Reviewed Exercise Discussed, Assistive device use and maintanence, Exercise Reviewed, Physical Activity, Weight Managment  [Encouraged]  Physical Activity Discussed/Reviewed Physical Activity Discussed, Home Exercise Program (HEP), Physical Activity  Reviewed, Types of exercise  [Encouraged]  Weight Management Weight maintenance  [Encouraged]  Education Interventions   Education Provided Provided Therapist, sports, Provided Web-based Education, Provided Education  [Encouraged]  Provided Verbal Education On Nutrition, Foot Care, Eye Care, Labs, Mental Health/Coping with Illness, When to see the doctor, [employer], [employer], Medication, Exercise, Applications  [Encouraged]  Applications Medicaid, Personal Care Services  [Encouraged]  Mental Health Interventions   Mental Health Discussed/Reviewed Mental Health Discussed, Anxiety, Depression, Mental Health Reviewed, Grief and Loss, Substance Abuse, Coping Strategies, Crisis, Other, Suicide  [Domestic Violence]  Nutrition Interventions   Nutrition Discussed/Reviewed Nutrition Discussed, Adding fruits and vegetables, Increasing proteins, Decreasing fats, Decreasing salt, Supplemental nutrition, Decreasing sugar intake, Portion sizes, Fluid intake, Nutrition Reviewed, Carbohydrate meal planning  [Encouraged]  Pharmacy Interventions   Pharmacy Dicussed/Reviewed Pharmacy Topics Discussed, Pharmacy Topics Reviewed, Medication Adherence, Affording Medications  [Encouraged]  Safety Interventions   Safety Discussed/Reviewed Safety Discussed, Safety Reviewed, Fall Risk, Home Safety  [Encouraged]  Home Safety Assistive Devices, Need for home safety assessment, Refer for community resources  [Encouraged]  Advanced Directive Interventions   Advanced Directives Discussed/Reviewed Advanced Directives Discussed  [Encouraged]     Active Listening & Reflection Utilized.  Verbalization of Feelings Encouraged.  Emotional Support Provided. Caregiver Burnout, Stress & Fatigue Validated. Symptoms of Anxiety Acknowledged. Caregiver Resources Revisited. Caregiver Support Groups Reviewed. Problem Solving Interventions Activated. Task-Centered Solutions Implemented.   Solution-Focused Strategies  Employed. Acceptance & Commitment Therapy Performed. Cognitive Behavioral Therapy Initiated. Client-Centered Therapy Indicated. CSW Collaboration with Sister, [name] to Encourage Patient's Self-Enrollment in Smoking Cessation Classes, Services, Agencies, & Resources of Interest, from List Provided. CSW Collaboration with Sister, [name] to Encourage Patient's Use of [employer], through [employer] Dual Complete [employer], [employer] (# [phone]) & [employer], through [employer] of Social  Services [phone]). CSW Collaboration with Sister, [name] to Continue Thorough Review of The Following List of [employer], Nurse, adult, Resources, Occupational psychologist: ~ Adult Day Care Programs  ~ [employer] & Respite Agencies ~ Home Health Care Agencies ~ Respite Care Agencies & Facilities ~ Merchant navy officer  ~ Theatre manager Providers ~ Affordable Apartments & Rental Properties in [place] ~ [facility] [employer] ~ Low Income Housing in [place] ~ Holiday representative Housing in [place] ~ Housing Pension scheme manager ~ Section 8 Housing Application  CSW Collaboration with Sister, [name] to Control and instrumentation engineer with [employer], Nurse, adult, Field seismologist of Interest in [place], in An Effort to Obtain Care & Supervision for Patient in The Home [employer]. CSW Collaboration with Sister, [name] to Confirm Patient's Recent Move with Son, [name] Into Two Bedroom Apartment in [place].   CSW Collaboration with Sister, [name] to [employer] with CSW [phone]), if She Has Questions, Needs Assistance, or If Additional Social Work Needs Are Identified Between Now & Our Next Scheduled Follow-Up Outreach Call.      Our next appointment is by telephone on 10/06/2022 at 2:30  pm.   Please call the care guide team at [phone] if you need to cancel or reschedule your appointment.   If you are experiencing a Mental Health or Behavioral Health Crisis or need someone to talk to, please call the Suicide and Crisis Lifeline: 988 call the [place] National Suicide Prevention Lifeline: 681-463-7489 or TTY: 3644923914 TTY (740)776-2183) to talk to a trained counselor call 1-800-273-TALK (toll free, 24 hour hotline) go to [facility] Urgent Care [address] [place] [phone]) call the [facility] Crisis Line: [phone] call 911  Patient verbalizes understanding of instructions and care plan provided today and agrees to view in MyChart. Active MyChart status and patient understanding of how to access instructions and care plan via MyChart confirmed with patient.     Telephone follow up appointment with care management team member scheduled for:  10/06/2022 at 2:30 pm.   Danford Bad, BSW, MSW, LCSW  Licensed Clinical Social Worker  [facility]  Mailing [place]. [address], [place], [place] [address] Physical Address-[address] [place], [place] [address] Toll Free Main # [phone] Fax # [phone] Cell # [phone] [email or web address]

## 2022-09-22 NOTE — Patient Outreach (Signed)
Care Coordination   Follow Up Visit Note   09/22/2022  Name: JACOB JILANI MRN: [id number] DOB: [birth year]  COMMODORE MONTEMURRO is a 72 y.o. year old male who sees Burdine, Ananias Pilgrim, MD for primary care. I spoke with patient's sister, [name] by phone today.  What matters to the patients health and wellness today?  Receive Assistance Obtaining Safe, Affordable, and Permanent Housing.    Goals Addressed               This Visit's Progress     Receive Assistance Obtaining Safe, Affordable and Permanent Housing. (pt-stated)   On track     Care Coordination Interventions:  Interventions Today    Flowsheet Row Most Recent Value  Chronic Disease   Chronic disease during today's visit Other  [Indwelling Cathetar, Severe Sepsis without Septic Shock, Acute Kidney Injury, Cocaine Abuse & Homelessness]  General Interventions   General Interventions Discussed/Reviewed General Interventions Discussed, Labs, Vaccines, Doctor Visits, Health Screening, Referral to Nurse, General Interventions Reviewed, Annual Eye Exam, Durable Medical Equipment (DME), [employer], Level of Care, Communication with, Lipid Profile  [Encouraged]  Labs Kidney Function  [Encouraged]  Vaccines COVID-19, Flu, Pneumonia, RSV, Shingles, Tetanus/Pertussis/Diphtheria  [Encouraged]  Doctor Visits Discussed/Reviewed Doctor Visits Discussed, Specialist, Doctor Visits Reviewed, Annual Wellness Visits, PCP  [Encouraged]  Health Screening Bone Density, Colonoscopy, Prostate  [Encouraged]  Durable Medical Equipment (DME) Other  [Indwelling Foley Catheter]  PCP/Specialist Visits Compliance with follow-up visit  [Encouraged]  Communication with PCP/Specialists, RN  [Encouraged]  Level of Care Adult Daycare, Applications, Assisted Living, Personal Care Services  [Encouraged]  Applications Medicaid, Personal Care Services  [Encouraged]  Exercise Interventions   Exercise Discussed/Reviewed Exercise Discussed,  Assistive device use and maintanence, Exercise Reviewed, Physical Activity, Weight Managment  [Encouraged]  Physical Activity Discussed/Reviewed Physical Activity Discussed, Home Exercise Program (HEP), Physical Activity Reviewed, Types of exercise  [Encouraged]  Weight Management Weight maintenance  [Encouraged]  Education Interventions   Education Provided Provided Therapist, sports, Provided Web-based Education, Provided Education  [Encouraged]  Provided Verbal Education On Nutrition, Foot Care, Eye Care, Labs, Mental Health/Coping with Illness, When to see the doctor, [employer], [employer], Medication, Exercise, Applications  [Encouraged]  Applications Medicaid, Personal Care Services  [Encouraged]  Mental Health Interventions   Mental Health Discussed/Reviewed Mental Health Discussed, Anxiety, Depression, Mental Health Reviewed, Grief and Loss, Substance Abuse, Coping Strategies, Crisis, Other, Suicide  [Domestic Violence]  Nutrition Interventions   Nutrition Discussed/Reviewed Nutrition Discussed, Adding fruits and vegetables, Increasing proteins, Decreasing fats, Decreasing salt, Supplemental nutrition, Decreasing sugar intake, Portion sizes, Fluid intake, Nutrition Reviewed, Carbohydrate meal planning  [Encouraged]  Pharmacy Interventions   Pharmacy Dicussed/Reviewed Pharmacy Topics Discussed, Pharmacy Topics Reviewed, Medication Adherence, Affording Medications  [Encouraged]  Safety Interventions   Safety Discussed/Reviewed Safety Discussed, Safety Reviewed, Fall Risk, Home Safety  [Encouraged]  Home Safety Assistive Devices, Need for home safety assessment, Refer for community resources  [Encouraged]  Advanced Directive Interventions   Advanced Directives Discussed/Reviewed Advanced Directives Discussed  [Encouraged]     Active Listening & Reflection Utilized.  Verbalization of Feelings Encouraged.  Emotional Support Provided. Caregiver Burnout, Stress & Fatigue  Validated. Symptoms of Anxiety Acknowledged. Caregiver Resources Revisited. Caregiver Support Groups Reviewed. Problem Solving Interventions Activated. Task-Centered Solutions Implemented.   Solution-Focused Strategies Employed. Acceptance & Commitment Therapy Performed. Cognitive Behavioral Therapy Initiated. Client-Centered Therapy Indicated. CSW Collaboration with Sister, [name] to Encourage Patient's Self-Enrollment in Smoking Cessation Classes, Services, Agencies, & Resources of Interest, from List Provided.  CSW Collaboration with Sister, [name] to Encourage Patient's Use of [employer], through [employer] Dual Complete [employer], [employer] (# [phone]) & [employer], through The [employer] of [facility] [phone]). CSW Collaboration with Sister, [name] to Continue Thorough Review of The Following List of [employer], Nurse, adult, Resources, Occupational psychologist: ~ Adult Day Care Programs  ~ [employer] & Respite Agencies ~ Home Health Care Agencies ~ Respite Care Agencies & Facilities ~ Merchant navy officer  ~ Theatre manager Providers ~ Affordable Apartments & Rental Properties in [place] ~ [facility] [employer] ~ Low Income Housing in [place] ~ Holiday representative Housing in [place] ~ Housing Pension scheme manager ~ Section 8 Housing Application  CSW Collaboration with Sister, [name] to Control and instrumentation engineer with [employer], Nurse, adult, Field seismologist of Interest in [place], in An Effort to Obtain Care & Supervision for Patient in The Home [employer]. CSW Collaboration with Sister, [name] to Confirm Patient's Recent Move with Son, [name] Into Two Bedroom Apartment in [place].   CSW Collaboration with Sister, [name] to [employer] with CSW [phone]), if She Has Questions, Needs Assistance, or If Additional Social Work Needs Are Identified Between Now & Our Next Scheduled Follow-Up Outreach Call.      SDOH assessments and interventions completed:  Yes.  Care Coordination Interventions:  Yes, provided.   Follow up plan: Follow up call scheduled for 10/06/2022 at 2:30 pm.   Encounter Outcome:  Pt. Visit Completed.   Danford Bad, BSW, MSW, LCSW  Licensed Restaurant manager, fast food [facility]  Mailing [place] [address], [place], [place] [address] Physical Address-[address], [place], [place] [address] Toll Free Main # [phone] Fax # [phone] Cell # [phone] [email or web address]

## 2022-09-30 ENCOUNTER — Ambulatory Visit: Payer: Self-pay | Admitting: *Deleted

## 2022-09-30 ENCOUNTER — Encounter: Payer: Self-pay | Admitting: *Deleted

## 2022-09-30 NOTE — Patient Outreach (Signed)
  Care Coordination   Follow Up Visit Note   09/30/2022 Name: MOHID FURUYA MRN: [id number] DOB: 06/12/1950  VALENTIN BENNEY is a 72 y.o. year old male who sees Burdine, Ananias Pilgrim, MD for primary care. I spoke with  Marica Otter by phone today.  What matters to the patients health and wellness today?  Seeing urologist for catheter change    Goals Addressed             This Visit's Progress    COMPLETED: Care Coordination Services       Care Coordination Goals Patient/sister will follow-up with LCSW re: housing resources Patient/sister will reach out to RN Care Coordinator [phone] with any resource or care coordination needs     COMPLETED: Scheduling Follow-up with Urologist       Care Coordination Goals: Patient will keep all medical appointments and follow-up with urologist as scheduled and as needed Patient has a f/u appt scheduled for 10/05/22 Verified transportation resources  Patient will take medications as prescribed  Patient will reach out to [facility] [phone] with any resource or care coordination needs        SDOH assessments and interventions completed:  Yes  SDOH Interventions Today    Flowsheet Row Most Recent Value  SDOH Interventions   Transportation Interventions Intervention Not Indicated  Financial Strain Interventions Intervention Not Indicated        Care Coordination Interventions:  Yes, provided  Interventions Today    Flowsheet Row Most Recent Value  Chronic Disease   Chronic disease during today's visit Other  [Urinary Retention]  General Interventions   General Interventions Discussed/Reviewed General Interventions Discussed, General Interventions Reviewed, Durable Medical Equipment (DME), Doctor Visits, Community Resources  Doctor Visits Discussed/Reviewed Doctor Visits Discussed, Doctor Visits Reviewed, PCP, Specialist  Durable Medical Equipment (DME) Other  [indwelling catheter]  PCP/Specialist Visits Compliance with  follow-up visit  [follow-up with urologist on 10/05/22]  Exercise Interventions   Exercise Discussed/Reviewed Physical Activity  Physical Activity Discussed/Reviewed Physical Activity Discussed, Physical Activity Reviewed  Education Interventions   Education Provided Provided Education  Provided Verbal Education On When to see the doctor, Mental Health/Coping with Illness, Community Resources  Charlyne Mom has been provided transportation resources by Care Management Dept]  Pharmacy Interventions   Pharmacy Dicussed/Reviewed Pharmacy Topics Discussed, Pharmacy Topics Reviewed  Safety Interventions   Safety Discussed/Reviewed Safety Discussed, Safety Reviewed, Home Safety, Fall Risk       Follow up plan: No further intervention required.   Encounter Outcome:  Pt. Visit Completed   Demetrios Loll, BSN, RN-BC RN Care Coordinator [facility]  [facility] Direct Dial: [phone] Main #: [phone]

## 2022-10-06 ENCOUNTER — Ambulatory Visit: Payer: Self-pay | Admitting: *Deleted

## 2022-10-06 ENCOUNTER — Encounter: Payer: Self-pay | Admitting: *Deleted

## 2022-10-06 NOTE — Patient Instructions (Signed)
Visit Information  Thank you for taking time to visit with me today. Please don't hesitate to contact me if I can be of assistance to you.   Following are the goals we discussed today:   Goals Addressed               This Visit's Progress     Receive Assistance Obtaining Safe, Affordable and Permanent Housing. (pt-stated)   On track     Care Coordination Interventions:  Interventions Today    Flowsheet Row Most Recent Value  Chronic Disease   Chronic disease during today's visit Other  [Indwelling Cathetar, Severe Sepsis without Septic Shock, Acute Kidney Injury, Cocaine Abuse & Homelessness]  General Interventions   General Interventions Discussed/Reviewed General Interventions Discussed, Labs, Vaccines, Doctor Visits, Health Screening, Referral to Nurse, General Interventions Reviewed, Annual Eye Exam, Durable Medical Equipment (DME), [employer], Level of Care, Communication with, Lipid Profile  [Encouraged]  Labs Kidney Function  [Encouraged]  Vaccines COVID-19, Flu, Pneumonia, RSV, Shingles, Tetanus/Pertussis/Diphtheria  [Encouraged]  Doctor Visits Discussed/Reviewed Doctor Visits Discussed, Specialist, Doctor Visits Reviewed, Annual Wellness Visits, PCP  [Encouraged]  Health Screening Bone Density, Colonoscopy, Prostate  [Encouraged]  Durable Medical Equipment (DME) Other  [Indwelling Foley Catheter]  PCP/Specialist Visits Compliance with follow-up visit  [Encouraged]  Communication with PCP/Specialists, RN  [Encouraged]  Level of Care Adult Daycare, Applications, Assisted Living, Personal Care Services  [Encouraged]  Applications Medicaid, Personal Care Services  [Encouraged]  Exercise Interventions   Exercise Discussed/Reviewed Exercise Discussed, Assistive device use and maintanence, Exercise Reviewed, Physical Activity, Weight Managment  [Encouraged]  Physical Activity Discussed/Reviewed Physical Activity Discussed, Home Exercise Program (HEP), Physical Activity  Reviewed, Types of exercise  [Encouraged]  Weight Management Weight maintenance  [Encouraged]  Education Interventions   Education Provided Provided Therapist, sports, Provided Web-based Education, Provided Education  [Encouraged]  Provided Verbal Education On Nutrition, Foot Care, Eye Care, Labs, Mental Health/Coping with Illness, When to see the doctor, [employer], [employer], Medication, Exercise, Applications  [Encouraged]  Applications Medicaid, Personal Care Services  [Encouraged]  Mental Health Interventions   Mental Health Discussed/Reviewed Mental Health Discussed, Anxiety, Depression, Mental Health Reviewed, Grief and Loss, Substance Abuse, Coping Strategies, Crisis, Other, Suicide  [Domestic Violence]  Nutrition Interventions   Nutrition Discussed/Reviewed Nutrition Discussed, Adding fruits and vegetables, Increasing proteins, Decreasing fats, Decreasing salt, Supplemental nutrition, Decreasing sugar intake, Portion sizes, Fluid intake, Nutrition Reviewed, Carbohydrate meal planning  [Encouraged]  Pharmacy Interventions   Pharmacy Dicussed/Reviewed Pharmacy Topics Discussed, Pharmacy Topics Reviewed, Medication Adherence, Affording Medications  [Encouraged]  Safety Interventions   Safety Discussed/Reviewed Safety Discussed, Safety Reviewed, Fall Risk, Home Safety  [Encouraged]  Home Safety Assistive Devices, Need for home safety assessment, Refer for community resources  [Encouraged]  Advanced Directive Interventions   Advanced Directives Discussed/Reviewed Advanced Directives Discussed  [Encouraged]      Active Listening & Reflection Utilized.  Verbalization of Feelings Encouraged.  Emotional Support Provided. Feelings of Hopefulness Validated. Diminished Symptoms of Anxiety Acknowledged. Problem Solving Interventions Activated. Task-Centered Solutions Implemented.   Solution-Focused Strategies Employed. Acceptance & Commitment Therapy Performed. Cognitive  Behavioral Therapy Initiated. Client-Centered Therapy Indicated. Encouraged Administration of Prescription Medications Exactly as Prescribed. CSW Collaboration with Patient & Sister, [name] to Encourage Patient's Self-Enrollment in Smoking Cessation Classes, Services, Agencies, & Resources of Interest, from List Provided. CSW Collaboration with Patient & Sister, [name] to Encourage Patient's Use of [employer], through [employer] Dual Complete [employer], [employer] (# [phone]) & [employer], through The  [facility] Department of Social Services [phone]). CSW Collaboration with Patient & Sister, [name] to Control and instrumentation engineer with [employer], Nurse, adult, Field seismologist of Interest in [place], in An Effort to Obtain Care & Supervision in The [employer] CSW Collaboration with Patient & Sister, [name] to Confirm Patient's Recent Move (10/02/2022) to [address], [place], [place] to Reside with Friend in Two Bedroom Single Family Home in [place].   CSW Collaboration with Patient & Sister, [name] to [employer] with CSW [phone]), if They Have Questions, Need Assistance, or If Additional Social Work Needs Are Identified Between Now & Our Next Scheduled Follow-Up Outreach Call.      Our next appointment is by telephone on 10/21/2022 at 9:45 am.  Please call the care guide team at [phone] if you need to cancel or reschedule your appointment.   If you are experiencing a Mental Health or Behavioral Health Crisis or need someone to talk to, please call the Suicide and Crisis Lifeline: 988 call the [place] National Suicide Prevention Lifeline: 360-707-5922 or TTY: 680-739-2655 TTY 959-438-7372) to talk to a trained counselor call 1-800-273-TALK (toll free, 24 hour hotline) go to [facility] [address] [place]  [phone]) call the [facility] Crisis Line: [phone] call 911  Patient verbalizes understanding of instructions and care plan provided today and agrees to view in MyChart. Active MyChart status and patient understanding of how to access instructions and care plan via MyChart confirmed with patient.     Telephone follow up appointment with care management team member scheduled for:  10/21/2022 at 9:45 am.  Danford Bad, BSW, MSW, LCSW  Licensed Clinical Social Worker  [facility]  Mailing [place], [place], [place] [address] Physical Address-[address] [place], [place] [address] Toll Free Main # [phone] Fax # [phone] Cell # [phone] [email or web address]

## 2022-10-06 NOTE — Patient Outreach (Signed)
Care Coordination   Follow Up Visit Note   10/06/2022  Name: Ian Boyd MRN: [id number] DOB: [birth year]  Ian Boyd is a 72 y.o. year old male who sees Ian Boyd for primary care. I spoke with Ian Boyd and sister, Ian Boyd by phone today.  What matters to the patients health and wellness today?  Receive Assistance Obtaining Safe, Affordable and Permanent Housing.   Goals Addressed               This Visit's Progress     Receive Assistance Obtaining Safe, Affordable and Permanent Housing. (pt-stated)   On track     Care Coordination Interventions:  Interventions Today    Flowsheet Row Most Recent Value  Chronic Disease   Chronic disease during today's visit Other  [Indwelling Cathetar, Severe Sepsis without Septic Shock, Acute Kidney Injury, Cocaine Abuse & Homelessness]  General Interventions   General Interventions Discussed/Reviewed General Interventions Discussed, Labs, Vaccines, Doctor Visits, Health Screening, Referral to Nurse, General Interventions Reviewed, Annual Eye Exam, Durable Medical Equipment (DME), [employer], Level of Care, Communication with, Lipid Profile  [Encouraged]  Labs Kidney Function  [Encouraged]  Vaccines COVID-19, Flu, Pneumonia, RSV, Shingles, Tetanus/Pertussis/Diphtheria  [Encouraged]  Doctor Visits Discussed/Reviewed Doctor Visits Discussed, Specialist, Doctor Visits Reviewed, Annual Wellness Visits, PCP  [Encouraged]  Health Screening Bone Density, Colonoscopy, Prostate  [Encouraged]  Durable Medical Equipment (DME) Other  [Indwelling Foley Catheter]  PCP/Specialist Visits Compliance with follow-up visit  [Encouraged]  Communication with PCP/Specialists, RN  [Encouraged]  Level of Care Adult Daycare, Applications, Assisted Living, Personal Care Services  [Encouraged]  Applications Medicaid, Personal Care Services  [Encouraged]  Exercise Interventions   Exercise Discussed/Reviewed Exercise  Discussed, Assistive device use and maintanence, Exercise Reviewed, Physical Activity, Weight Managment  [Encouraged]  Physical Activity Discussed/Reviewed Physical Activity Discussed, Home Exercise Program (HEP), Physical Activity Reviewed, Types of exercise  [Encouraged]  Weight Management Weight maintenance  [Encouraged]  Education Interventions   Education Provided Provided Therapist, sports, Provided Web-based Education, Provided Education  [Encouraged]  Provided Verbal Education On Nutrition, Foot Care, Eye Care, Labs, Mental Health/Coping with Illness, When to see the doctor, [employer], [employer], Medication, Exercise, Applications  [Encouraged]  Applications Medicaid, Personal Care Services  [Encouraged]  Mental Health Interventions   Mental Health Discussed/Reviewed Mental Health Discussed, Anxiety, Depression, Mental Health Reviewed, Grief and Loss, Substance Abuse, Coping Strategies, Crisis, Other, Suicide  [Domestic Violence]  Nutrition Interventions   Nutrition Discussed/Reviewed Nutrition Discussed, Adding fruits and vegetables, Increasing proteins, Decreasing fats, Decreasing salt, Supplemental nutrition, Decreasing sugar intake, Portion sizes, Fluid intake, Nutrition Reviewed, Carbohydrate meal planning  [Encouraged]  Pharmacy Interventions   Pharmacy Dicussed/Reviewed Pharmacy Topics Discussed, Pharmacy Topics Reviewed, Medication Adherence, Affording Medications  [Encouraged]  Safety Interventions   Safety Discussed/Reviewed Safety Discussed, Safety Reviewed, Fall Risk, Home Safety  [Encouraged]  Home Safety Assistive Devices, Need for home safety assessment, Refer for community resources  [Encouraged]  Advanced Directive Interventions   Advanced Directives Discussed/Reviewed Advanced Directives Discussed  [Encouraged]      Active Listening & Reflection Utilized.  Verbalization of Feelings Encouraged.  Emotional Support Provided. Feelings of Hopefulness  Validated. Diminished Symptoms of Anxiety Acknowledged. Problem Solving Interventions Activated. Task-Centered Solutions Implemented.   Solution-Focused Strategies Employed. Acceptance & Commitment Therapy Performed. Cognitive Behavioral Therapy Initiated. Client-Centered Therapy Indicated. Encouraged Administration of Prescription Medications Exactly as Prescribed. CSW Collaboration with Patient & Sister, Ian Boyd to Encourage Patient's Self-Enrollment in Smoking Cessation Classes, Services, Agencies, & Resources  of Interest, from List Provided. CSW Collaboration with Patient & Sister, Ian Boyd to Encourage Patient's Use of [employer], through [employer] Dual Complete [employer], [employer] (# [phone]) & [employer], through The [employer] of [facility] [phone]). CSW Collaboration with Patient & Sister, Ian Boyd to Control and instrumentation engineer with [employer], Nurse, adult, Field seismologist of Interest in [place], in An Effort to Obtain Care & Supervision in The [employer] CSW Collaboration with Patient & Sister, Ian Boyd to Confirm Patient's Recent Move (10/02/2022) to [address] [place], [place] to Reside with Friend in Two Bedroom Single Family Home in [place].   CSW Collaboration with Patient & Sister, Ian Boyd to [employer] with CSW [phone]), if They Have Questions, Need Assistance, or If Additional Social Work Needs Are Identified Between Now & Our Next Scheduled Follow-Up Outreach Call.      SDOH assessments and interventions completed:  Yes.  Care Coordination Interventions:  Yes, provided.   Follow up plan: Follow up call scheduled for 10/21/2022 at 9:45 am.  Encounter Outcome:  Pt. Visit Completed.   Danford Bad, BSW, MSW, LCSW  Licensed Restaurant manager, fast food [facility]   Mailing [place] [address] [place], [place] [address] Physical Address-[address] [place], [place] [address] Toll Free Main # [phone] Fax # [phone] Cell # [phone] [email or web address]

## 2022-10-18 DIAGNOSIS — T83098A Other mechanical complication of other indwelling urethral catheter, initial encounter: Secondary | ICD-10-CM | POA: Diagnosis not present

## 2022-10-18 DIAGNOSIS — N390 Urinary tract infection, site not specified: Secondary | ICD-10-CM | POA: Diagnosis not present

## 2022-10-18 DIAGNOSIS — T83511A Infection and inflammatory reaction due to indwelling urethral catheter, initial encounter: Secondary | ICD-10-CM | POA: Diagnosis not present

## 2022-10-21 ENCOUNTER — Ambulatory Visit: Payer: Self-pay | Admitting: *Deleted

## 2022-10-21 ENCOUNTER — Encounter: Payer: Self-pay | Admitting: *Deleted

## 2022-10-21 NOTE — Patient Outreach (Signed)
Care Coordination   Follow Up Visit Note   10/21/2022  Name: Ian Boyd MRN: [id number] DOB: [birth year]  Ian Boyd is a 72 y.o. year old male who sees Burdine, Ananias Pilgrim, MD for primary care. I spoke with Marica Otter by phone today.  What matters to the patients health and wellness today?  Receive Assistance Obtaining Safe, Affordable, and Permanent Housing.   Goals Addressed               This Visit's Progress     COMPLETED: Receive Assistance Obtaining Safe, Affordable, and Permanent Housing. (pt-stated)   On track     Care Coordination Interventions:  Interventions Today    Flowsheet Row Most Recent Value  Chronic Disease   Chronic disease during today's visit Other  [Indwelling Cathetar, Severe Sepsis without Septic Shock, Acute Kidney Injury, Cocaine Abuse & Homelessness]  General Interventions   General Interventions Discussed/Reviewed General Interventions Discussed, Labs, Vaccines, Doctor Visits, Health Screening, Referral to Nurse, General Interventions Reviewed, Annual Eye Exam, Durable Medical Equipment (DME), [employer], Level of Care, Communication with, Lipid Profile  [Encouraged]  Labs Kidney Function  [Encouraged]  Vaccines COVID-19, Flu, Pneumonia, RSV, Shingles, Tetanus/Pertussis/Diphtheria  [Encouraged]  Doctor Visits Discussed/Reviewed Doctor Visits Discussed, Specialist, Doctor Visits Reviewed, Annual Wellness Visits, PCP  [Encouraged]  Health Screening Bone Density, Colonoscopy, Prostate  [Encouraged]  Durable Medical Equipment (DME) Other  [Indwelling Foley Catheter]  PCP/Specialist Visits Compliance with follow-up visit  [Encouraged]  Communication with PCP/Specialists, RN  [Encouraged]  Level of Care Adult Daycare, Applications, Assisted Living, Personal Care Services  [Encouraged]  Applications Medicaid, Personal Care Services  [Encouraged]  Exercise Interventions   Exercise Discussed/Reviewed Exercise Discussed, Assistive  device use and maintanence, Exercise Reviewed, Physical Activity, Weight Managment  [Encouraged]  Physical Activity Discussed/Reviewed Physical Activity Discussed, Home Exercise Program (HEP), Physical Activity Reviewed, Types of exercise  [Encouraged]  Weight Management Weight maintenance  [Encouraged]  Education Interventions   Education Provided Provided Therapist, sports, Provided Web-based Education, Provided Education  [Encouraged]  Provided Verbal Education On Nutrition, Foot Care, Eye Care, Labs, Mental Health/Coping with Illness, When to see the doctor, [employer], [employer], Medication, Exercise, Applications  [Encouraged]  Applications Medicaid, Personal Care Services  [Encouraged]  Mental Health Interventions   Mental Health Discussed/Reviewed Mental Health Discussed, Anxiety, Depression, Mental Health Reviewed, Grief and Loss, Substance Abuse, Coping Strategies, Crisis, Other, Suicide  [Domestic Violence]  Nutrition Interventions   Nutrition Discussed/Reviewed Nutrition Discussed, Adding fruits and vegetables, Increasing proteins, Decreasing fats, Decreasing salt, Supplemental nutrition, Decreasing sugar intake, Portion sizes, Fluid intake, Nutrition Reviewed, Carbohydrate meal planning  [Encouraged]  Pharmacy Interventions   Pharmacy Dicussed/Reviewed Pharmacy Topics Discussed, Pharmacy Topics Reviewed, Medication Adherence, Affording Medications  [Encouraged]  Safety Interventions   Safety Discussed/Reviewed Safety Discussed, Safety Reviewed, Fall Risk, Home Safety  [Encouraged]  Home Safety Assistive Devices, Need for home safety assessment, Refer for community resources  [Encouraged]  Advanced Directive Interventions   Advanced Directives Discussed/Reviewed Advanced Directives Discussed  [Encouraged]      Active Listening & Reflection Utilized.  Verbalization of Feelings Encouraged.  Emotional Support Provided. Problem Solving Interventions  Employed. Task-Centered Solutions Activated.   Solution-Focused Strategies Conducted. Acceptance & Commitment Therapy Performed. Cognitive Behavioral Therapy Initiated. Client-Centered Therapy Implemented. Encouraged Consideration of Higher Level of Care Placement (I.e Assisted Living, Extended Care, Intermediate Level Care, Etc.). Encouraged Follow-Up with Dr. Quintin Alto, Primary Care Provider with [facility] [phone]). Encouraged Increased Level of Activity & Exercise,  as Tolerated. Encouraged Administration of Prescription Medications Exactly as Prescribed. Encouraged Self-Enrollment in Smoking Cessation Classes, Services, Agencies, & Resources of Interest, from List Provided. Encouraged Continued Involvement with Home Health Skilled Nursing Services, for Management & Care of Indwelling Catheter. Encouraged Use of Transportation Benefits, through [employer], [employer] (# [phone]) & Medicaid Transportation [phone]). Encouraged Solicitor with [employer], Nurse, adult, Field seismologist of Interest in [place], in An Effort to [employer] & Supervision in [employer] Encouraged Continued Residency with Friend/Caregiver, in Two Bedroom Single Family Home. Encouraged Contact with CSW (# [phone]), if You Have Questions, Need Assistance, or If Additional Social Work Needs Are Identified in The Near Future.      SDOH assessments and interventions completed:  Yes.  Care Coordination Interventions:  Yes, provided.   Follow up plan: No further intervention required.   Encounter Outcome:  Pt. Visit Completed.   Danford Bad, BSW, MSW, LCSW  Licensed Restaurant manager, fast food [facility]  Mailing [place] [address], [place], [place] [address] Physical Address[address] [place], [place] [address] Toll Free Main #  [phone] Fax # [phone] Cell # [phone] [email or web address]

## 2022-10-21 NOTE — Patient Instructions (Signed)
Visit Information  Thank you for taking time to visit with me today. Please don't hesitate to contact me if I can be of assistance to you.   Following are the goals we discussed today:   Goals Addressed               This Visit's Progress     COMPLETED: Receive Assistance Obtaining Safe, Affordable and Permanent Housing. (pt-stated)   On track     Care Coordination Interventions:  Interventions Today    Flowsheet Row Most Recent Value  Chronic Disease   Chronic disease during today's visit Other  [Indwelling Cathetar, Severe Sepsis without Septic Shock, Acute Kidney Injury, Cocaine Abuse & Homelessness]  General Interventions   General Interventions Discussed/Reviewed General Interventions Discussed, Labs, Vaccines, Doctor Visits, Health Screening, Referral to Nurse, General Interventions Reviewed, Annual Eye Exam, Durable Medical Equipment (DME), [employer], Level of Care, Communication with, Lipid Profile  [Encouraged]  Labs Kidney Function  [Encouraged]  Vaccines COVID-19, Flu, Pneumonia, RSV, Shingles, Tetanus/Pertussis/Diphtheria  [Encouraged]  Doctor Visits Discussed/Reviewed Doctor Visits Discussed, Specialist, Doctor Visits Reviewed, Annual Wellness Visits, PCP  [Encouraged]  Health Screening Bone Density, Colonoscopy, Prostate  [Encouraged]  Durable Medical Equipment (DME) Other  [Indwelling Foley Catheter]  PCP/Specialist Visits Compliance with follow-up visit  [Encouraged]  Communication with PCP/Specialists, RN  [Encouraged]  Level of Care Adult Daycare, Applications, Assisted Living, Personal Care Services  [Encouraged]  Applications Medicaid, Personal Care Services  [Encouraged]  Exercise Interventions   Exercise Discussed/Reviewed Exercise Discussed, Assistive device use and maintanence, Exercise Reviewed, Physical Activity, Weight Managment  [Encouraged]  Physical Activity Discussed/Reviewed Physical Activity Discussed, Home Exercise Program (HEP), Physical  Activity Reviewed, Types of exercise  [Encouraged]  Weight Management Weight maintenance  [Encouraged]  Education Interventions   Education Provided Provided Therapist, sports, Provided Web-based Education, Provided Education  [Encouraged]  Provided Verbal Education On Nutrition, Foot Care, Eye Care, Labs, Mental Health/Coping with Illness, When to see the doctor, [employer], [employer], Medication, Exercise, Applications  [Encouraged]  Applications Medicaid, Personal Care Services  [Encouraged]  Mental Health Interventions   Mental Health Discussed/Reviewed Mental Health Discussed, Anxiety, Depression, Mental Health Reviewed, Grief and Loss, Substance Abuse, Coping Strategies, Crisis, Other, Suicide  [Domestic Violence]  Nutrition Interventions   Nutrition Discussed/Reviewed Nutrition Discussed, Adding fruits and vegetables, Increasing proteins, Decreasing fats, Decreasing salt, Supplemental nutrition, Decreasing sugar intake, Portion sizes, Fluid intake, Nutrition Reviewed, Carbohydrate meal planning  [Encouraged]  Pharmacy Interventions   Pharmacy Dicussed/Reviewed Pharmacy Topics Discussed, Pharmacy Topics Reviewed, Medication Adherence, Affording Medications  [Encouraged]  Safety Interventions   Safety Discussed/Reviewed Safety Discussed, Safety Reviewed, Fall Risk, Home Safety  [Encouraged]  Home Safety Assistive Devices, Need for home safety assessment, Refer for community resources  [Encouraged]  Advanced Directive Interventions   Advanced Directives Discussed/Reviewed Advanced Directives Discussed  [Encouraged]      Active Listening & Reflection Utilized.  Verbalization of Feelings Encouraged.  Emotional Support Provided. Problem Solving Interventions Employed. Task-Centered Solutions Activated.   Solution-Focused Strategies Conducted. Acceptance & Commitment Therapy Performed. Cognitive Behavioral Therapy Initiated. Client-Centered Therapy Implemented. Encouraged  Consideration of Higher Level of Care Placement (I.e Assisted Living, Extended Care, Intermediate Level Care, Etc.). Encouraged Follow-Up with Dr. Quintin Alto, Primary Care Provider with [facility] [phone]). Encouraged Increased Level of Activity & Exercise, as Tolerated. Encouraged Administration of Prescription Medications Exactly as Prescribed. Encouraged Self-Enrollment in Smoking Cessation Classes, Services, Agencies, & Resources of Interest, from List Provided. Encouraged Continued Involvement with Home Health Skilled  Nursing Services, for Management & Care of Indwelling Catheter. Encouraged Use of Transportation Benefits, through [employer], [employer] (# [phone]) & Medicaid Transportation [phone]). Encouraged Solicitor with [employer], Nurse, adult, Field seismologist of Interest in [place], in An Effort to [employer] & Supervision in [employer] Encouraged Continued Residency with Friend/Caregiver, in Two Bedroom Single Family Home. Encouraged Contact with CSW (# [phone]), if You Have Questions, Need Assistance, or If Additional Social Work Needs Are Identified in The Near Future.      Please call the care guide team at [phone] if you need to cancel or reschedule your appointment.   If you are experiencing a Mental Health or Behavioral Health Crisis or need someone to talk to, please call the Suicide and Crisis Lifeline: 988 call the [place] National Suicide Prevention Lifeline: (940) 770-8067 or TTY: 224-561-7848 TTY (313)078-1597) to talk to a trained counselor call 1-800-273-TALK (toll free, 24 hour hotline) go to [facility] [address] [place] [phone]) call the [facility] Crisis Line: [phone] call 911  Patient verbalizes understanding of instructions and care plan provided today and agrees to view in  MyChart. Active MyChart status and patient understanding of how to access instructions and care plan via MyChart confirmed with patient.     No further follow up required.  Danford Bad, BSW, MSW, LCSW  Licensed Restaurant manager, fast food [facility]  Mailing [place] [address], [place], [place] [address] Physical Address-[address] [place], [place] [address] Toll Free Main # [phone] Fax # [phone] Cell # [phone] [email or web address]

## 2022-12-01 DIAGNOSIS — R7989 Other specified abnormal findings of blood chemistry: Secondary | ICD-10-CM | POA: Diagnosis not present

## 2022-12-01 DIAGNOSIS — Z13220 Encounter for screening for lipoid disorders: Secondary | ICD-10-CM | POA: Diagnosis not present

## 2022-12-01 DIAGNOSIS — E860 Dehydration: Secondary | ICD-10-CM | POA: Diagnosis not present

## 2022-12-01 DIAGNOSIS — E039 Hypothyroidism, unspecified: Secondary | ICD-10-CM | POA: Diagnosis not present

## 2022-12-01 DIAGNOSIS — D509 Iron deficiency anemia, unspecified: Secondary | ICD-10-CM | POA: Diagnosis not present

## 2022-12-01 DIAGNOSIS — J449 Chronic obstructive pulmonary disease, unspecified: Secondary | ICD-10-CM | POA: Diagnosis not present

## 2022-12-01 DIAGNOSIS — N179 Acute kidney failure, unspecified: Secondary | ICD-10-CM | POA: Diagnosis not present

## 2022-12-01 DIAGNOSIS — Z1329 Encounter for screening for other suspected endocrine disorder: Secondary | ICD-10-CM | POA: Diagnosis not present

## 2022-12-10 NOTE — Progress Notes (Signed)
Name: Ian Boyd DOB: Aug 28, 1950 MRN: [id number]  History of Present Illness: Ian Boyd is a 72 y.o. male who presents today as a new patient at [facility]. All available relevant medical records have been reviewed.  - GU History: Previously followed at [facility].  1. BPH.  2. Chronic urinary retention.  - Managed with indwelling Foley catheter. 3. Kidney & bladder stones. - 08/11/2021: Underwent ureteroscopic stone manipulation / cystolitholapaxy by Dr. Cardell Peach. 4. Bladder diverticulum.  5. Syphilis.  6. Prior episode of urosepsis in 2023.  Most recent relevant imaging: - 11/02/2021: CT abdomen/pelvis w/o contrast showed multiple small (<3 mm) bilateral non-obstructing intrarenal stones and likely bladder diverticula at the bilateral base of the bladder, greater at the left side (4.7 cm).   Today: He reports chief complaint of chronic urinary retention. He is hoping to either get a prostate / bladder outlet procedure to no longer need a catheter. If unable to do that, he would then prefer SP tube over urethral indwelling catheter for long term use.  He reports the catheter is draining well.  He denies gross hematuria, flank pain, abdominal pain.    Fall Screening: Do you usually have a device to assist in your mobility? No   Medications: Current Outpatient Medications  Medication Sig Dispense Refill   Albuterol Sulfate, sensor, (PROAIR DIGIHALER) 108 (90 Base) MCG/ACT AEPB Inhale 1 puff into the lungs daily as needed (shortness of breath).     amLODipine (NORVASC) 5 MG tablet Take 5 mg by mouth daily.     dorzolamide-timolol (COSOPT) 22.3-6.8 MG/ML ophthalmic solution Place 1 drop into the left eye 2 (two) times daily. (0900 & 2100)     INCRUSE ELLIPTA 62.5 MCG/ACT AEPB Inhale 1 puff into the lungs in the morning.     RHOPRESSA 0.02 % SOLN Place 1 drop into the left eye at bedtime. (2100)     SPIRIVA RESPIMAT 2.5 MCG/ACT AERS Take 1 Inhaler by  mouth daily.     tamsulosin (FLOMAX) 0.4 MG CAPS capsule Take 1 capsule (0.4 mg total) by mouth daily. 30 capsule 5   VYZULTA 0.024 % SOLN Place 1 drop into the left eye at bedtime. (2100)     Nutritional Supplements (NUTRITIONAL SUPPLEMENT PO) Take 120 mLs by mouth in the morning, at noon, and at bedtime. Medpass (0800,1300 & 1700)     pantoprazole (PROTONIX) 40 MG tablet TAKE 1 TABLET (40 MG TOTAL) BY MOUTH 2 (TWO) TIMES DAILY BEFORE A MEAL. 180 tablet 1   No current facility-administered medications for this visit.    Allergies: No Known Allergies  Past Medical History:  Diagnosis Date   Acid reflux    Blind right eye    BPH (benign prostatic hyperplasia)    COPD (chronic obstructive pulmonary disease) (HCC)    Drug abuse (HCC)    Glaucoma    Hepatitis C    History of severe sepsis    Hyponatremia    Psoas abscess, right (HCC) 06/2021   Rotator cuff tear    Chronic right   Sepsis (HCC) 06/2021   E coli bacteremia from a urinary source complicated by obstructive ureteral calculus   Thrombocytopenia (HCC)    Past Surgical History:  Procedure Laterality Date   CATARACT EXTRACTION Left    CYSTOSCOPY/URETEROSCOPY/HOLMIUM LASER/STENT PLACEMENT N/A 08/11/2021   Procedure: CYSTOSCOPY/BILATERAL URETEROSCOPY/HOLMIUM LASER/RETROGRADE PYELOGRAMS/RIGHT STENT PLACEMENT;  Surgeon: Jannifer Hick, MD;  Location: [facility];  Service: Urology;  Laterality: N/A;   ESOPHAGEAL DILATION  N/A 05/12/2018   Procedure: ESOPHAGEAL DILATION;  Surgeon: Malissa Hippo, MD;  Location: [facility];  Service: Endoscopy;  Laterality: N/A;   ESOPHAGOGASTRODUODENOSCOPY N/A 05/12/2018   Procedure: ESOPHAGOGASTRODUODENOSCOPY (EGD);  Surgeon: Malissa Hippo, MD;  Location: [facility];  Service: Endoscopy;  Laterality: N/A;  2:45   IR NEPHROSTOMY PLACEMENT LEFT  06/26/2021   IR RADIOLOGIST EVAL & MGMT  07/16/2021   History reviewed. No pertinent family history. Social History   Socioeconomic History    Marital status: Single    Spouse name: Not on file   Number of children: 1   Years of education: 10   Highest education level: 10th grade  Occupational History   Not on file  Tobacco Use   Smoking status: Every Day    Current packs/day: 0.50    Types: Cigarettes    Passive exposure: Current   Smokeless tobacco: Never   Tobacco comments:    Smokes 1/2 Pack a Day.    Smoking Cessation Classes, Services, Agencies & Resources Provided.  Vaping Use   Vaping status: Never Used  Substance and Sexual Activity   Alcohol use: Not Currently    Comment: quit 5-7 yrs ago.    Drug use: Yes    Types: Cocaine, Marijuana    Comment: Does marijuana and cocaine.    Sexual activity: Not Currently    Partners: Female  Other Topics Concern   Not on file  Social History Narrative   Not on file   Social Determinants of Health   Financial Resource Strain: Low Risk  (09/30/2022)   Overall Financial Resource Strain (CARDIA)    Difficulty of Paying Living Expenses: Not hard at all  Food Insecurity: No Food Insecurity (09/02/2022)   Hunger Vital Sign    Worried About Running Out of Food in the Last Year: Never true    Ran Out of Food in the Last Year: Never true  Transportation Needs: No Transportation Needs (09/30/2022)   PRAPARE - Administrator, Civil Service (Medical): No    Lack of Transportation (Non-Medical): No  Physical Activity: Sufficiently Active (09/02/2022)   Exercise Vital Sign    Days of Exercise per Week: 5 days    Minutes of Exercise per Session: 40 min  Stress: No Stress Concern Present (09/02/2022)   [employer] of Occupational Health - Occupational Stress Questionnaire    Feeling of Stress : Only a little  Social Connections: Unknown (09/02/2022)   Social Connection and Isolation Panel [NHANES]    Frequency of Communication with Friends and Family: More than three times a week    Frequency of Social Gatherings with Friends and Family: More than three times a  week    Attends Religious Services: More than 4 times per year    Active Member of [employer] or Organizations: Yes    Attends Engineer, structural: More than 4 times per year    Marital Status: Not on file  Intimate Partner Violence: Not At Risk (09/02/2022)   Humiliation, Afraid, Rape, and Kick questionnaire    Fear of Current or Ex-Partner: No    Emotionally Abused: No    Physically Abused: No    Sexually Abused: No    SUBJECTIVE  Review of Systems Constitutional: Patient denies any unintentional weight loss or change in strength lntegumentary: Patient denies any rashes or pruritus Eyes: Patient reports blindness in one eye Cardiovascular: Patient denies chest pain or syncope Respiratory: Patient denies shortness of breath Gastrointestinal: Patient  denies nausea, vomiting, constipation, or diarrhea Musculoskeletal: Patient denies muscle cramps or weakness Neurologic: Patient denies convulsions or seizures Psychiatric: Patient denies memory problems Allergic/Immunologic: Patient denies recent allergic reaction(s) Hematologic/Lymphatic: Patient denies bleeding tendencies Endocrine: Patient denies heat/cold intolerance  GU: As per HPI.  OBJECTIVE Vitals:   12/15/22 0910 12/15/22 0925  BP: (!) 162/118 (!) 162/118  Pulse: 92   Temp: 97.7 F (36.5 C)    There is no height or weight on file to calculate BMI.  Physical Examination  Constitutional: No obvious distress; patient is non-toxic appearing  Cardiovascular: No visible lower extremity edema.  Respiratory: The patient does not have audible wheezing/stridor; respirations do not appear labored  Gastrointestinal: Abdomen non-distended Musculoskeletal: Normal ROM of UEs  Skin: No obvious rashes/open sores  Neurologic: CN 2-12 grossly intact Psychiatric: Answered questions appropriately with normal affect  Hematologic/Lymphatic/Immunologic: No obvious bruises or sites of spontaneous bleeding  GU: Catheter draining  clear yellow urine.  ASSESSMENT Benign prostatic hyperplasia with urinary retention - Plan: tamsulosin (FLOMAX) 0.4 MG CAPS capsule  Foley catheter present - Plan: INSERT,TEMP INDWELLING BLAD CATH,SIMPLE, ciprofloxacin (CIPRO) tablet 500 mg  History of sepsis  Nephrolithiasis - Plan: US RENAL, DG Abd 1 View  Bladder diverticulum  We discussed possible etiologies for urinary retention including neurogenic bladder, BPH/BOO, constipation, anticholinergic medication use, high-tone pelvic floor dysfunction, voiding dyssynergia.   Records requested from [facility]. Would like to review findings from his most recent cystoscopy, any urodynamic testing results, operative notes, and relevant visit notes to aid [place] in determining appropriate next steps.  We agreed to continue with indwelling Foley catheter for now, which was exchanged today by nursing staff. Patient is aware that catheter should be exchanged every 4 weeks; he elected to follow up here for that.   Will plan for cystoscopy here to assess prostatomegaly / bladder outlet obstruction / bladder abnormalities etc. for possible surgical planning. We agreed to start Flomax 0.4 mg daily for BPH / BOO in hopes that may assist him with future voiding trial. He was advised that urodynamic testing may also be necessary to assess bladder function / contractility prior to any outlet procedures. If previous records from [facility] demonstrate evidence of neurogenic bladder with chronic detrusor areflexia, may proceed with SP tube placement - that was discussed in detail with patient today. He verbalized understanding and agreement.  We agreed to obtain [facility] for stone surveillance prior to next visit; he is asymptomatic for stones at this time.  All questions were answered.  PLAN Advised the following: 1. Records requested from [facility]  2. Foley catheter exchange today. 3. Start Flomax 0.4 mg daily. 4. Nurse visit in 4  weeks for routine catheter exchange. 5. Return for 1st available cystoscopy with any urology MD with [facility] prior .  Orders Placed This Encounter  Procedures   US RENAL    Standing Status:   Future    Standing Expiration Date:   12/15/2023    Order Specific Question:   Reason for Exam (SYMPTOM  OR DIAGNOSIS REQUIRED)    Answer:   kidney stone known or suspected    Order Specific Question:   Preferred imaging location?    Answer:   [facility]   DG Abd 1 View    Standing Status:   Future    Standing Expiration Date:   12/15/2023    Order Specific Question:   Reason for Exam (SYMPTOM  OR DIAGNOSIS REQUIRED)  Answer:   kidney stone    Order Specific Question:   Preferred imaging location?    Answer:   [facility]   INSERT,TEMP INDWELLING BLAD CATH,SIMPLE    It has been explained that the patient is to follow regularly with their PCP in addition to all other providers involved in their care and to follow instructions provided by these respective offices. Patient advised to contact urology clinic if any urologic-pertaining questions, concerns, new symptoms or problems arise in the interim period.  There are no Patient Instructions on file for this visit.  Electronically signed by:  Donnita Falls, MSN, FNP-C, CUNP 12/15/2022 10:45 AM

## 2022-12-15 ENCOUNTER — Ambulatory Visit (INDEPENDENT_AMBULATORY_CARE_PROVIDER_SITE_OTHER): Payer: 59 | Admitting: Urology

## 2022-12-15 ENCOUNTER — Encounter: Payer: Self-pay | Admitting: Urology

## 2022-12-15 VITALS — BP 162/118 | HR 92 | Temp 97.7°F

## 2022-12-15 DIAGNOSIS — N200 Calculus of kidney: Secondary | ICD-10-CM | POA: Diagnosis not present

## 2022-12-15 DIAGNOSIS — Z8619 Personal history of other infectious and parasitic diseases: Secondary | ICD-10-CM

## 2022-12-15 DIAGNOSIS — N323 Diverticulum of bladder: Secondary | ICD-10-CM | POA: Diagnosis not present

## 2022-12-15 DIAGNOSIS — N401 Enlarged prostate with lower urinary tract symptoms: Secondary | ICD-10-CM | POA: Diagnosis not present

## 2022-12-15 DIAGNOSIS — R338 Other retention of urine: Secondary | ICD-10-CM | POA: Diagnosis not present

## 2022-12-15 DIAGNOSIS — Z978 Presence of other specified devices: Secondary | ICD-10-CM

## 2022-12-15 MED ORDER — CIPROFLOXACIN HCL 500 MG PO TABS
500.0000 mg | ORAL_TABLET | Freq: Once | ORAL | Status: AC
  Administered 2022-12-15: 500 mg via ORAL

## 2022-12-15 MED ORDER — TAMSULOSIN HCL 0.4 MG PO CAPS: 0.4000 mg | ORAL_CAPSULE | Freq: Every day | ORAL | 5 refills | Status: DC

## 2022-12-15 NOTE — Progress Notes (Signed)
Cath Change/ Replacement  Patient is present today for a catheter change due to urinary retention.  10ml of water was removed from the balloon, a 16FR foley cath was removed without difficulty.  Patient was cleaned and prepped in a sterile fashion with betadine. A 16 FR foley cath was replaced into the bladder, no complications were noted. Urine return was noted 30ml and urine was yellow in color. The balloon was filled with 10ml of sterile water. A leg bag was attached for drainage.   Patient was given proper instruction on catheter care.    Performed by: Tita Terhaar LPN  Follow up: No follow-ups on file.

## 2022-12-16 ENCOUNTER — Ambulatory Visit (HOSPITAL_COMMUNITY)
Admission: RE | Admit: 2022-12-16 | Discharge: 2022-12-16 | Disposition: A | Discharge status: Home / Self Care | Payer: 59 | Source: Ambulatory Visit | Attending: Urology | Admitting: Urology

## 2022-12-16 DIAGNOSIS — N200 Calculus of kidney: Secondary | ICD-10-CM

## 2022-12-25 DIAGNOSIS — H401133 Primary open-angle glaucoma, bilateral, severe stage: Secondary | ICD-10-CM | POA: Diagnosis not present

## 2022-12-28 ENCOUNTER — Other Ambulatory Visit: Payer: 59 | Admitting: Urology

## 2023-01-04 ENCOUNTER — Telehealth: Payer: Self-pay

## 2023-01-04 NOTE — Telephone Encounter (Signed)
-----   Message from Donnita Falls sent at 01/04/2023  1:51 PM EDT ----- Please let pt know: - RUS & KUB showed no acute findings. He has a small non-obstructing stone in the right kidney. He had "Moderate to large volume of stool throughout the colon" which limited assessment of the left kidney due to overlying shadowing; no obvious left renal stones. - The plan at last visit was "Return for 1st available cystoscopy with any urology MD" to assess prostatomegaly / bladder outlet obstruction / bladder abnormalities etc. for possible BPH/BOO surgical planning. He was a no-show for that on 12/28/2022; advised to reschedule.

## 2023-01-04 NOTE — Telephone Encounter (Signed)
Patient is aware of NP's response to imaging, patient voiced understanding.  He is scheduled for a cysto with Dr. Retta Diones on 10/22, [place] for cath change canceled for 10/24.  Appt reminder mailed to patient.

## 2023-01-12 ENCOUNTER — Encounter: Payer: Self-pay | Admitting: Urology

## 2023-01-12 ENCOUNTER — Ambulatory Visit: Payer: 59 | Admitting: Urology

## 2023-01-12 VITALS — BP 186/112 | HR 73

## 2023-01-12 DIAGNOSIS — R338 Other retention of urine: Secondary | ICD-10-CM | POA: Diagnosis not present

## 2023-01-12 DIAGNOSIS — N401 Enlarged prostate with lower urinary tract symptoms: Secondary | ICD-10-CM | POA: Diagnosis not present

## 2023-01-12 DIAGNOSIS — N200 Calculus of kidney: Secondary | ICD-10-CM

## 2023-01-12 DIAGNOSIS — Z87442 Personal history of urinary calculi: Secondary | ICD-10-CM | POA: Diagnosis not present

## 2023-01-12 DIAGNOSIS — Z978 Presence of other specified devices: Secondary | ICD-10-CM

## 2023-01-12 DIAGNOSIS — N323 Diverticulum of bladder: Secondary | ICD-10-CM

## 2023-01-12 MED ORDER — CIPROFLOXACIN HCL 500 MG PO TABS: 500.0000 mg | ORAL_TABLET | Freq: Once | ORAL | Status: DC

## 2023-01-12 MED ORDER — SULFAMETHOXAZOLE-TRIMETHOPRIM 800-160 MG PO TABS: 1.0000 | ORAL_TABLET | Freq: Two times a day (BID) | ORAL | 0 refills | Status: AC

## 2023-01-12 NOTE — Progress Notes (Signed)
History of Present Illness:   72 year old male presents for further follow-up of multiple issues.  1.  BPH/areflexic bladder/bladder diverticulum Initially started on tamsulosin in 2022 because of lower urinary tract symptoms.  That did not improve his urination. He underwent evaluation including ultrasound prostate (4 elevated PSA).  Biopsies were negative, prostate volume 38 mL.  Cystoscopy in the operating room performed at the time of ureteroscopic stone management revealed a nonobstructing prostate, trabeculations and multiple bladder diverticula. Urodynamics performed in June 2023 revealed a capacity of 460 mL, voluntary contraction generated but no voiding.  Maximum detrusor pressure was 41 cm of water. He initiated CIC which was stopped after a few infections.  He has had an indwelling Foley catheter since that time.  Last catheter change at [facility] urology in [place] over a month ago. He is here today for cystoscopy.  2.  Elevated PSA. Initial referral to Dr. Cardell Peach in June 2022 with a PSA of 5.8.  Ultrasound and biopsy performed in August, 2022.  Prostate volume 38 mL.  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  He has not been followed with PSAs since that time  3.  Urolithiasis. Treatment of 5 mm left UVJ stone with initial percutaneous tube placement and then ureteroscopy after infection cleared.  That was in April, 2023.  At that time he was also found to have a 7 mm right renal stone. Stone analysis-calcium oxalate dihydrate/monohydrate-15/70% with 15% c carbonate appetite  In the past, he has not wanted any management of his poorly emptying bladder.  He is here today for cystoscopy. Past Medical History:  Diagnosis Date   Acid reflux    Blind right eye    BPH (benign prostatic hyperplasia)    COPD (chronic obstructive pulmonary disease) (HCC)    Drug abuse (HCC)    Glaucoma    Hepatitis C    History of severe sepsis    Hyponatremia    Psoas abscess, right (HCC) 06/2021    Rotator cuff tear    Chronic right   Sepsis (HCC) 06/2021   E coli bacteremia from a urinary source complicated by obstructive ureteral calculus   Thrombocytopenia (HCC)     Past Surgical History:  Procedure Laterality Date   CATARACT EXTRACTION Left    CYSTOSCOPY/URETEROSCOPY/HOLMIUM LASER/STENT PLACEMENT N/A 08/11/2021   Procedure: CYSTOSCOPY/BILATERAL URETEROSCOPY/HOLMIUM LASER/RETROGRADE PYELOGRAMS/RIGHT STENT PLACEMENT;  Surgeon: Jannifer Hick, MD;  Location: [facility];  Service: Urology;  Laterality: N/A;   ESOPHAGEAL DILATION N/A 05/12/2018   Procedure: ESOPHAGEAL DILATION;  Surgeon: Malissa Hippo, MD;  [facility]: [facility];  Service: Endoscopy;  Laterality: N/A;   ESOPHAGOGASTRODUODENOSCOPY N/A 05/12/2018   Procedure: ESOPHAGOGASTRODUODENOSCOPY (EGD);  Surgeon: Malissa Hippo, MD;  [facility];  Service: Endoscopy;  Laterality: N/A;  2:45   IR NEPHROSTOMY PLACEMENT LEFT  06/26/2021   IR RADIOLOGIST EVAL & MGMT  07/16/2021    Home Medications:  Allergies as of 01/12/2023   No Known Allergies      Medication List        Accurate as of January 12, 2023  8:20 AM. If you have any questions, ask your nurse or doctor.          amLODipine 5 MG tablet Commonly known as: NORVASC Take 5 mg by mouth daily.   dorzolamide-timolol 2-0.5 % ophthalmic solution Commonly known as: COSOPT Place 1 drop into the left eye 2 (two) times daily. (0900 & 2100)   Incruse Ellipta 62.5 MCG/ACT Aepb Generic drug: umeclidinium  bromide Inhale 1 puff into the lungs in the morning.   NUTRITIONAL SUPPLEMENT PO Take 120 mLs by mouth in the morning, at noon, and at bedtime. Medpass (0800,1300 & 1700)   pantoprazole 40 MG tablet Commonly known as: PROTONIX TAKE 1 TABLET (40 MG TOTAL) BY MOUTH 2 (TWO) TIMES DAILY BEFORE A MEAL.   ProAir Digihaler 108 (90 Base) MCG/ACT Aepb Generic drug: Albuterol Sulfate (sensor) Inhale 1 puff into the lungs daily as needed (shortness of  breath).   Rhopressa 0.02 % Soln Generic drug: Netarsudil Dimesylate Place 1 drop into the left eye at bedtime. (2100)   Spiriva Respimat 2.5 MCG/ACT Aers Generic drug: Tiotropium Bromide Monohydrate Take 1 Inhaler by mouth daily.   tamsulosin 0.4 MG Caps capsule Commonly known as: FLOMAX Take 1 capsule (0.4 mg total) by mouth daily.   Vyzulta 0.024 % Soln Generic drug: Latanoprostene Bunod Place 1 drop into the left eye at bedtime. (2100)        Allergies: No Known Allergies  No family history on file.  Social History:  reports that he has been smoking cigarettes. He has been exposed to tobacco smoke. He has never used smokeless tobacco. He reports that he does not currently use alcohol. He reports current drug use. Drugs: Cocaine and Marijuana.  ROS: A complete review of systems was performed.  All systems are negative except for pertinent findings as noted.  Physical Exam:  Vital signs in last 24 hours: There were no vitals taken for this visit. Constitutional:  Alert and oriented, No acute distress Cardiovascular: Regular rate  Respiratory: Normal respiratory effort Lymphatic: No lymphadenopathy Neurologic: Grossly intact, no focal deficits Psychiatric: Normal mood and affect  I have reviewed prior pt notes  I have reviewed notes from referring/previous physicians  I have reviewed urinalysis results  I have independently reviewed prior imaging  I have reviewed prior PSA results    Cystoscopy Procedure Note:  Indication: Urinary retention  After informed consent and discussion of the procedure and its risks, Ian Boyd was positioned and prepped in the standard fashion.  Cystoscopy was performed with a flexible cystoscope.   Findings: Urethra: No lesions, no stricture Prostate: Prostatic urethra approximately 3 cm in the length.  There is no evidence of obstruction. Bladder neck: Open Ureteral orifices: Normal bilaterally. Bladder: Large  posterior bladder diverticula.  No urothelial lesions.  Moderate to severe trabeculations.  Expected inflammation from catheter in the posterior bladder.  No bladder stones.  The patient tolerated the procedure well.      Impression/Assessment:  1.  Urinary retention.  Normal capacity bladder, poor contractility.  No evidence of obstruction based on cystoscopy today  2.  History of urolithiasis.  Currently asymptomatic  3.  History of elevated PSA with negative biopsy in 2022.  Plan:  1.  The patient is willing to restart the self-catheterization which he has done in the past.  He was given multiple samples of 16 French catheters  2.  Instructions were for him to catheterize at least 3-4 times a day  3.  He was given a prescription for tamsulosin at his last visit but has not filled that-I recommended he start it  4.  I covered him with 3 days of antibiotic for his past history of indwelling catheter  5.  Can come back in a couple of weeks to recheck

## 2023-01-14 ENCOUNTER — Ambulatory Visit: Payer: 59

## 2023-02-16 ENCOUNTER — Encounter: Payer: Self-pay | Admitting: Urology

## 2023-02-16 ENCOUNTER — Ambulatory Visit: Payer: 59 | Admitting: Urology

## 2023-02-16 VITALS — BP 167/90 | HR 78

## 2023-02-16 DIAGNOSIS — Z87442 Personal history of urinary calculi: Secondary | ICD-10-CM

## 2023-02-16 DIAGNOSIS — R338 Other retention of urine: Secondary | ICD-10-CM | POA: Diagnosis not present

## 2023-02-16 DIAGNOSIS — N401 Enlarged prostate with lower urinary tract symptoms: Secondary | ICD-10-CM

## 2023-02-16 DIAGNOSIS — R972 Elevated prostate specific antigen [PSA]: Secondary | ICD-10-CM

## 2023-02-16 DIAGNOSIS — Z87898 Personal history of other specified conditions: Secondary | ICD-10-CM | POA: Diagnosis not present

## 2023-02-16 DIAGNOSIS — N312 Flaccid neuropathic bladder, not elsewhere classified: Secondary | ICD-10-CM

## 2023-02-16 NOTE — Progress Notes (Signed)
History of Present Illness:   72 year old male presents for further follow-up of multiple issues.  1.  BPH/areflexic bladder/bladder diverticulum Initially started on tamsulosin in 2022 because of lower urinary tract symptoms.  That did not improve his urination. He underwent evaluation including ultrasound prostate (4 elevated PSA).  Biopsies were negative, prostate volume 38 mL.  Cystoscopy in the operating room performed at the time of ureteroscopic stone management revealed a nonobstructing prostate, trabeculations and multiple bladder diverticula. Urodynamics performed in June 2023 revealed a capacity of 460 mL, voluntary contraction generated but no voiding.  Maximum detrusor pressure was 41 cm of water. He initiated CIC which was stopped after a few infections.  He has had an indwelling Foley catheter since that time.  Last catheter change at [facility] urology in [place] over a month ago.  2.  Elevated PSA. Initial referral to Dr. Cardell Peach in June 2022 with a PSA of 5.8.  Ultrasound and biopsy performed in August, 2022.  Prostate volume 38 mL.  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  He has not been followed with PSAs since that time  3.  Urolithiasis. Treatment of 5 mm left UVJ stone with initial percutaneous tube placement and then ureteroscopy after infection cleared.  That was in April, 2023.  At that time he was also found to have a 7 mm right renal stone. Stone analysis-calcium oxalate dihydrate/monohydrate-15/70% with 15% c carbonate appetite  10.22.2024:  Cysto revealed no obstruction. He was started on cic.  He has been doing this without difficulty 4 times a day.  He has not had PSA rechecked in some time, at least not in the epic system. 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   Rotator cuff tear    Chronic right   Sepsis (HCC) 06/2021   E coli bacteremia from a urinary source complicated by obstructive ureteral calculus   Thrombocytopenia (HCC)     Past Surgical History:  Procedure Laterality Date   CATARACT EXTRACTION Left    CYSTOSCOPY/URETEROSCOPY/HOLMIUM LASER/STENT PLACEMENT N/A 08/11/2021   Procedure: CYSTOSCOPY/BILATERAL URETEROSCOPY/HOLMIUM LASER/RETROGRADE PYELOGRAMS/RIGHT STENT PLACEMENT;  Surgeon: Jannifer Hick, MD;  Location: [facility];  Service: Urology;  Laterality: N/A;   ESOPHAGEAL DILATION N/A 05/12/2018   Procedure: ESOPHAGEAL DILATION;  Surgeon: Malissa Hippo, MD;  [facility];  Service: Endoscopy;  Laterality: N/A;   ESOPHAGOGASTRODUODENOSCOPY N/A 05/12/2018   Procedure: ESOPHAGOGASTRODUODENOSCOPY (EGD);  Surgeon: Malissa Hippo, MD;  [facility];  Service: Endoscopy;  Laterality: N/A;  2:45   IR NEPHROSTOMY PLACEMENT LEFT  06/26/2021   IR RADIOLOGIST EVAL & MGMT  07/16/2021    Home Medications:  Allergies as of 02/16/2023   No Known Allergies      Medication List        Accurate as of February 16, 2023  6:47 AM. If you have any questions, ask your nurse or doctor.          amLODipine 5 MG tablet Commonly known as: NORVASC Take 5 mg by mouth daily.   dorzolamide-timolol 2-0.5 % ophthalmic solution Commonly known as: COSOPT Place 1 drop into the left eye 2 (two) times daily. (  0900 & 2100)   Incruse Ellipta 62.5 MCG/ACT Aepb Generic drug: umeclidinium bromide Inhale 1 puff into the lungs in the morning.   NUTRITIONAL SUPPLEMENT PO Take 120 mLs by mouth in the morning, at noon, and at bedtime. Medpass (0800,1300 & 1700)   pantoprazole 40 MG tablet Commonly known as: PROTONIX TAKE 1 TABLET (40 MG TOTAL) BY MOUTH 2 (TWO) TIMES DAILY BEFORE A MEAL.   ProAir Digihaler 108 (90 Base) MCG/ACT Aepb Generic drug: Albuterol Sulfate (sensor) Inhale 1  puff into the lungs daily as needed (shortness of breath).   Rhopressa 0.02 % Soln Generic drug: Netarsudil Dimesylate Place 1 drop into the left eye at bedtime. (2100)   Spiriva Respimat 2.5 MCG/ACT Aers Generic drug: Tiotropium Bromide Monohydrate Take 1 Inhaler by mouth daily.   sulfamethoxazole-trimethoprim 800-160 MG tablet Commonly known as: BACTRIM DS Take 1 tablet by mouth 2 (two) times daily.   tamsulosin 0.4 MG Caps capsule Commonly known as: FLOMAX Take 1 capsule (0.4 mg total) by mouth daily.   Vyzulta 0.024 % Soln Generic drug: Latanoprostene Bunod Place 1 drop into the left eye at bedtime. (2100)        Allergies: No Known Allergies  No family history on file.  Social History:  reports that he has been smoking cigarettes. He has been exposed to tobacco smoke. He has never used smokeless tobacco. He reports that he does not currently use alcohol. He reports current drug use. Drugs: Cocaine and Marijuana.  ROS: A complete review of systems was performed.  All systems are negative except for pertinent findings as noted.  Physical Exam:  Vital signs in last 24 hours: There were no vitals taken for this visit. Constitutional:  Alert and oriented, No acute distress Cardiovascular: Regular rate  Respiratory: Normal respiratory effort Lymphatic: No lymphadenopathy Neurologic: Grossly intact, no focal deficits Psychiatric: Normal mood and affect  I have reviewed prior pt notes  I have reviewed notes from referring/previous physicians  I have reviewed urinalysis results  I have independently reviewed prior imaging--prior CT images reviewed with the patient  I have reviewed prior PSA results    Impression/Assessment:  1.  Urinary retention.  Normal capacity bladder, poor contractility.  No evidence of obstruction based on recent cysto.  He is doing well with self-catheterization  2.  History of urolithiasis.  Currently asymptomatic  3.  History of  elevated PSA with negative biopsy in 2022.  Plan:  1.  He will continue self-catheterization 4 times a day with 16 [place] prelubricated straight catheter removed [place] [employer]  2.  His PSA is checked today  3.  I will see him back in 6 months for recheck

## 2023-02-17 LAB — PSA: Prostate Specific Ag, Serum: 7.5 ng/mL — ABNORMAL HIGH (ref 0.0–4.0)

## 2023-02-23 ENCOUNTER — Telehealth: Payer: Self-pay

## 2023-02-23 ENCOUNTER — Other Ambulatory Visit: Payer: Self-pay

## 2023-02-23 DIAGNOSIS — R972 Elevated prostate specific antigen [PSA]: Secondary | ICD-10-CM

## 2023-02-23 DIAGNOSIS — R339 Retention of urine, unspecified: Secondary | ICD-10-CM | POA: Diagnosis not present

## 2023-02-23 NOTE — Telephone Encounter (Signed)
Patient aware of MD response and appt reminders mailed to pt.

## 2023-02-23 NOTE — Telephone Encounter (Signed)
-----   Message from Bertram Millard Dahlstedt sent at 02/22/2023  3:42 PM EST ----- Let pt know that psa is still elevated a bit (give #)--no need to do anything different @ present ----- Message ----- From: Interface, [facility] Lab Results In Sent: 02/17/2023  10:36 AM EST To: Marcine Matar, MD

## 2023-03-26 DIAGNOSIS — R339 Retention of urine, unspecified: Secondary | ICD-10-CM | POA: Diagnosis not present

## 2023-04-26 DIAGNOSIS — R339 Retention of urine, unspecified: Secondary | ICD-10-CM | POA: Diagnosis not present

## 2023-05-04 DIAGNOSIS — D72829 Elevated white blood cell count, unspecified: Secondary | ICD-10-CM | POA: Diagnosis not present

## 2023-05-04 DIAGNOSIS — R112 Nausea with vomiting, unspecified: Secondary | ICD-10-CM | POA: Diagnosis not present

## 2023-05-04 DIAGNOSIS — K5730 Diverticulosis of large intestine without perforation or abscess without bleeding: Secondary | ICD-10-CM | POA: Diagnosis not present

## 2023-05-04 DIAGNOSIS — R7989 Other specified abnormal findings of blood chemistry: Secondary | ICD-10-CM | POA: Diagnosis not present

## 2023-05-04 DIAGNOSIS — F17210 Nicotine dependence, cigarettes, uncomplicated: Secondary | ICD-10-CM | POA: Diagnosis not present

## 2023-05-04 DIAGNOSIS — N200 Calculus of kidney: Secondary | ICD-10-CM | POA: Diagnosis not present

## 2023-05-04 DIAGNOSIS — Z79899 Other long term (current) drug therapy: Secondary | ICD-10-CM | POA: Diagnosis not present

## 2023-05-04 DIAGNOSIS — N3001 Acute cystitis with hematuria: Secondary | ICD-10-CM | POA: Diagnosis not present

## 2023-05-04 DIAGNOSIS — K4020 Bilateral inguinal hernia, without obstruction or gangrene, not specified as recurrent: Secondary | ICD-10-CM | POA: Diagnosis not present

## 2023-05-04 DIAGNOSIS — R109 Unspecified abdominal pain: Secondary | ICD-10-CM | POA: Diagnosis not present

## 2023-05-04 DIAGNOSIS — I10 Essential (primary) hypertension: Secondary | ICD-10-CM | POA: Diagnosis not present

## 2023-05-26 DIAGNOSIS — R339 Retention of urine, unspecified: Secondary | ICD-10-CM | POA: Diagnosis not present

## 2023-05-31 DIAGNOSIS — N3001 Acute cystitis with hematuria: Secondary | ICD-10-CM | POA: Diagnosis not present

## 2023-05-31 DIAGNOSIS — Z87442 Personal history of urinary calculi: Secondary | ICD-10-CM | POA: Diagnosis not present

## 2023-05-31 DIAGNOSIS — I10 Essential (primary) hypertension: Secondary | ICD-10-CM | POA: Diagnosis not present

## 2023-05-31 DIAGNOSIS — R350 Frequency of micturition: Secondary | ICD-10-CM | POA: Diagnosis not present

## 2023-05-31 DIAGNOSIS — F17210 Nicotine dependence, cigarettes, uncomplicated: Secondary | ICD-10-CM | POA: Diagnosis not present

## 2023-05-31 DIAGNOSIS — R300 Dysuria: Secondary | ICD-10-CM | POA: Diagnosis not present

## 2023-06-09 ENCOUNTER — Other Ambulatory Visit: Payer: Self-pay | Admitting: Urology

## 2023-06-09 DIAGNOSIS — N401 Enlarged prostate with lower urinary tract symptoms: Secondary | ICD-10-CM

## 2023-07-19 DIAGNOSIS — R339 Retention of urine, unspecified: Secondary | ICD-10-CM | POA: Diagnosis not present

## 2023-08-10 ENCOUNTER — Other Ambulatory Visit: Payer: 59

## 2023-08-11 LAB — PSA: Prostate Specific Ag, Serum: 6.4 ng/mL — ABNORMAL HIGH (ref 0.0–4.0)

## 2023-08-16 NOTE — Progress Notes (Incomplete)
 History of Present Illness:   73 year old male presents for further follow-up of multiple issues.  1.  BPH/areflexic bladder/bladder diverticulum Initially started on tamsulosin  in 2022 because of lower urinary tract symptoms.  That did not improve his urination. He underwent evaluation including ultrasound prostate (4 elevated PSA).  Biopsies were negative, prostate volume 38 mL.  Cystoscopy in the operating room performed at the time of ureteroscopic stone management revealed a nonobstructing prostate, trabeculations and multiple bladder diverticula. Urodynamics performed in June 2023 revealed a capacity of 460 mL, voluntary contraction generated but no voiding.  Maximum detrusor pressure was 41 cm of water . He initiated CIC which was stopped after a few infections.  He has had an indwelling Foley catheter since that time.  Last catheter change at [facility] urology in [place] over a month ago.  2.  Elevated PSA. Initial referral to Dr. Freddi Jaeger in June 2022 with a PSA of 5.8.  Ultrasound and biopsy performed in August, 2022.  Prostate volume 38 mL.  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  He has not been followed with PSAs since that time  3.  Urolithiasis. Treatment of 5 mm left UVJ stone with initial percutaneous tube placement and then ureteroscopy after infection cleared.  That was in April, 2023.  At that time he was also found to have a 7 mm right renal stone. Stone analysis-calcium oxalate dihydrate/monohydrate-15/70% with 15% c carbonate appetite  10.22.2024:  Cysto revealed no obstruction. He was started on cic.  He has been doing this without difficulty 4 times a day.  He has not had PSA rechecked in some time, at least not in the epic system. 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   Rotator cuff tear    Chronic right   Sepsis (HCC) 06/2021   E coli bacteremia from a urinary source complicated by obstructive ureteral calculus   Thrombocytopenia (HCC)     Past Surgical History:  Procedure Laterality Date   CATARACT EXTRACTION Left    CYSTOSCOPY/URETEROSCOPY/HOLMIUM LASER/STENT PLACEMENT N/A 08/11/2021   Procedure: CYSTOSCOPY/BILATERAL URETEROSCOPY/HOLMIUM LASER/RETROGRADE PYELOGRAMS/RIGHT STENT PLACEMENT;  Surgeon: Lahoma Pigg, MD;  Location: [facility];  Service: Urology;  Laterality: N/A;   ESOPHAGEAL DILATION N/A 05/12/2018   Procedure: ESOPHAGEAL DILATION;  Surgeon: Ruby Corporal, MD;  [facility];  Service: Endoscopy;  Laterality: N/A;   ESOPHAGOGASTRODUODENOSCOPY N/A 05/12/2018   Procedure: ESOPHAGOGASTRODUODENOSCOPY (EGD);  Surgeon: Ruby Corporal, MD;  [facility];  Service: Endoscopy;  Laterality: N/A;  2:45   IR NEPHROSTOMY PLACEMENT LEFT  06/26/2021   IR RADIOLOGIST EVAL & MGMT  07/16/2021    Home Medications:  Allergies as of 08/17/2023   No Known Allergies      Medication List        Accurate as of Aug 16, 2023  6:18 PM. If you have any questions, ask your nurse or doctor.          amLODipine  5 MG tablet Commonly known as: NORVASC  Take 5 mg by mouth daily.   dorzolamide -timolol  2-0.5 % ophthalmic solution Commonly known as: COSOPT  Place 1 drop into the left eye 2 (two) times daily. (  0900 & 2100)   Incruse Ellipta  62.5 MCG/ACT Aepb Generic drug: umeclidinium bromide  Inhale 1 puff into the lungs in the morning.   NUTRITIONAL SUPPLEMENT PO Take 120 mLs by mouth in the morning, at noon, and at bedtime. Medpass (0800,1300 & 1700)   pantoprazole  40 MG tablet Commonly known as: PROTONIX  TAKE 1 TABLET (40 MG TOTAL) BY MOUTH 2 (TWO) TIMES DAILY BEFORE A MEAL.   ProAir  Digihaler 108 (90 Base) MCG/ACT Aepb Generic drug: Albuterol  Sulfate (sensor) Inhale 1 puff  into the lungs daily as needed (shortness of breath).   Rhopressa  0.02 % Soln Generic drug: Netarsudil  Dimesylate Place 1 drop into the left eye at bedtime. (2100)   Spiriva  Respimat 2.5 MCG/ACT Aers Generic drug: Tiotropium Bromide  Monohydrate Take 1 Inhaler by mouth daily.   sulfamethoxazole -trimethoprim  800-160 MG tablet Commonly known as: BACTRIM  DS Take 1 tablet by mouth 2 (two) times daily.   tamsulosin  0.4 MG Caps capsule Commonly known as: FLOMAX  TAKE 1 CAPSULE BY MOUTH EVERY DAY   Vyzulta  0.024 % Soln Generic drug: Latanoprostene Bunod  Place 1 drop into the left eye at bedtime. (2100)        Allergies: No Known Allergies  No family history on file.  Social History:  reports that he has been smoking cigarettes. He has been exposed to tobacco smoke. He has never used smokeless tobacco. He reports that he does not currently use alcohol. He reports current drug use. Drugs: Cocaine and Marijuana.  ROS: A complete review of systems was performed.  All systems are negative except for pertinent findings as noted.  Physical Exam:  Vital signs in last 24 hours: There were no vitals taken for this visit. Constitutional:  Alert and oriented, No acute distress Cardiovascular: Regular rate  Respiratory: Normal respiratory effort Lymphatic: No lymphadenopathy Neurologic: Grossly intact, no focal deficits Psychiatric: Normal mood and affect  I have reviewed prior pt notes  I have reviewed notes from referring/previous physicians  I have reviewed urinalysis results  I have independently reviewed prior imaging--prior CT images reviewed with the patient  I have reviewed prior PSA results    Impression/Assessment:  1.  Urinary retention.  Normal capacity bladder, poor contractility.  No evidence of obstruction based on recent cysto.  He is doing well with self-catheterization  2.  History of urolithiasis.  Currently asymptomatic  3.  History of elevated PSA with  negative biopsy in 2022.  Plan:  1.  He will continue self-catheterization 4 times a day with 16 [place] prelubricated straight catheter removed [employer]  2.  His PSA is checked today  3.  I will see him back in 6 months for recheck

## 2023-08-17 ENCOUNTER — Ambulatory Visit: Payer: 59 | Admitting: Urology

## 2023-09-07 DIAGNOSIS — R339 Retention of urine, unspecified: Secondary | ICD-10-CM | POA: Diagnosis not present

## 2023-10-07 DIAGNOSIS — R339 Retention of urine, unspecified: Secondary | ICD-10-CM | POA: Diagnosis not present

## 2023-11-08 DIAGNOSIS — R339 Retention of urine, unspecified: Secondary | ICD-10-CM | POA: Diagnosis not present

## 2024-01-07 DIAGNOSIS — R339 Retention of urine, unspecified: Secondary | ICD-10-CM | POA: Diagnosis not present

## 2024-01-31 ENCOUNTER — Telehealth: Payer: Self-pay | Admitting: Urology

## 2024-01-31 NOTE — Telephone Encounter (Signed)
 Ian Boyd

## 2024-01-31 NOTE — Telephone Encounter (Signed)
 Pt is made aware we received order for cath supplies and he will need a follow up appointment. Pt is scheduled and made aware. Voiced understanding

## 2024-01-31 NOTE — Progress Notes (Unsigned)
 Impression/Assessment:  1.  Urinary retention.  Normal capacity bladder, poor contractility.  No evidence of obstruction based on recent cysto.  He is doing well with self-catheterization  2.  History of urolithiasis.  Currently asymptomatic  3.  History of elevated PSA with negative biopsy in 2022.  Plan:  1.  He will continue self-catheterization 4 times a day with 16 French prelubricated straight catheter removed [facility]  2.  His PSA is checked today  3.  I will see him back in 6 months for recheck   History of Present Illness:   73 year old male presents for further follow-up of multiple issues.  1.  BPH/areflexic bladder/bladder diverticulum Initially started on tamsulosin  in 2022 because of lower urinary tract symptoms.  That did not improve his urination. He underwent evaluation including ultrasound prostate (4 elevated PSA).  Biopsies were negative, prostate volume 38 mL.  Cystoscopy in the operating room performed at the time of ureteroscopic stone management revealed a nonobstructing prostate, trabeculations and multiple bladder diverticula. Urodynamics performed in June 2023 revealed a capacity of 460 mL, voluntary contraction generated but no voiding.  Maximum detrusor pressure was 41 cm of water . He has been on cic.  2.  Elevated PSA. Initial referral to Dr. Selma in June 2022 with a PSA of 5.8.  Ultrasound and biopsy performed in August, 2022.  Prostate volume 38 mL.  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    3.  Urolithiasis. Treatment of 5 mm left UVJ stone with initial percutaneous tube placement and then ureteroscopy after infection cleared.  That was in April, 2023.  At that time he was also found to have a 7 mm right renal stone. Stone analysis-calcium oxalate dihydrate/monohydrate-15/70% with 15% c carbonate appetite  10.22.2024:  Cysto revealed no obstruction. He was started on cic.  He has been doing this without difficulty 4 times a day.   PSA 7.5  11.11.2025:  PSA in May of this year--6.4 Past Medical History:  Diagnosis Date   Acid reflux    Blind right eye    BPH (benign prostatic hyperplasia)    COPD (chronic obstructive pulmonary disease) (HCC)    Drug abuse (HCC)    Glaucoma    Hepatitis C    History of severe sepsis    Hyponatremia    Psoas abscess, right (HCC) 06/2021   Rotator cuff tear    Chronic right   Sepsis (HCC) 06/2021   E coli bacteremia from a urinary source complicated by obstructive ureteral calculus   Thrombocytopenia     Past Surgical History:  Procedure Laterality Date   CATARACT EXTRACTION Left    CYSTOSCOPY/URETEROSCOPY/HOLMIUM LASER/STENT PLACEMENT N/A 08/11/2021   Procedure: CYSTOSCOPY/BILATERAL URETEROSCOPY/HOLMIUM LASER/RETROGRADE PYELOGRAMS/RIGHT STENT PLACEMENT;  Surgeon: Selma Donnice SAUNDERS, MD;  Location: [facility];  Service: Urology;  Laterality: N/A;   ESOPHAGEAL DILATION N/A 05/12/2018   Procedure: ESOPHAGEAL DILATION;  Surgeon: Golda Claudis PENNER, MD;  Location: [facility];  Service: Endoscopy;  Laterality: N/A;   ESOPHAGOGASTRODUODENOSCOPY N/A 05/12/2018   Procedure: ESOPHAGOGASTRODUODENOSCOPY (EGD);  Surgeon: Golda Claudis PENNER, MD;  [facility]: [facility];  Service: Endoscopy;  Laterality: N/A;  2:45   IR NEPHROSTOMY PLACEMENT LEFT  06/26/2021   IR RADIOLOGIST EVAL & MGMT  07/16/2021    Home Medications:  Allergies as of 02/01/2024   No Known Allergies      Medication List        Accurate as of January 31, 2024  5:50 PM. If you  have any questions, ask your nurse or doctor.          amLODipine  5 MG tablet Commonly known as: NORVASC  Take 5 mg by mouth daily.   dorzolamide -timolol  2-0.5 % ophthalmic solution Commonly known as: COSOPT  Place 1 drop into the left eye 2 (two) times daily. (0900 & 2100)   Incruse Ellipta  62.5 MCG/ACT Aepb Generic drug: umeclidinium bromide  Inhale 1 puff into the lungs in the morning.   NUTRITIONAL SUPPLEMENT PO Take 120 mLs by mouth in  the morning, at noon, and at bedtime. Medpass (0800,1300 & 1700)   pantoprazole  40 MG tablet Commonly known as: PROTONIX  TAKE 1 TABLET (40 MG TOTAL) BY MOUTH 2 (TWO) TIMES DAILY BEFORE A MEAL.   ProAir  Digihaler 108 (90 Base) MCG/ACT Aepb Generic drug: Albuterol  Sulfate (sensor) Inhale 1 puff into the lungs daily as needed (shortness of breath).   Rhopressa  0.02 % Soln Generic drug: Netarsudil  Dimesylate Place 1 drop into the left eye at bedtime. (2100)   Spiriva  Respimat 2.5 MCG/ACT Aers Generic drug: Tiotropium Bromide  Take 1 Inhaler by mouth daily.   sulfamethoxazole -trimethoprim  800-160 MG tablet Commonly known as: BACTRIM  DS Take 1 tablet by mouth 2 (two) times daily.   tamsulosin  0.4 MG Caps capsule Commonly known as: FLOMAX  TAKE 1 CAPSULE BY MOUTH EVERY DAY   Vyzulta  0.024 % Soln Generic drug: Latanoprostene Bunod  Place 1 drop into the left eye at bedtime. (2100)        Allergies: No Known Allergies  No family history on file.  Social History:  reports that he has been smoking cigarettes. He has been exposed to tobacco smoke. He has never used smokeless tobacco. He reports that he does not currently use alcohol. He reports current drug use. Drugs: Cocaine and Marijuana.  ROS: A complete review of systems was performed.  All systems are negative except for pertinent findings as noted.  Physical Exam:  Vital signs in last 24 hours: There were no vitals taken for this visit. Constitutional:  Alert and oriented, No acute distress Cardiovascular: Regular rate  Respiratory: Normal respiratory effort Lymphatic: No lymphadenopathy Neurologic: Grossly intact, no focal deficits Psychiatric: Normal mood and affect  I have reviewed prior pt notes  I have reviewed notes from referring/previous physicians  I have reviewed urinalysis results  I have independently reviewed prior imaging--prior CT images reviewed with the patient  I have reviewed prior PSA  results

## 2024-02-01 ENCOUNTER — Ambulatory Visit: Admitting: Urology

## 2024-02-01 VITALS — BP 161/78 | HR 76

## 2024-02-01 DIAGNOSIS — R339 Retention of urine, unspecified: Secondary | ICD-10-CM

## 2024-02-01 DIAGNOSIS — N401 Enlarged prostate with lower urinary tract symptoms: Secondary | ICD-10-CM

## 2024-02-01 DIAGNOSIS — N529 Male erectile dysfunction, unspecified: Secondary | ICD-10-CM

## 2024-02-01 DIAGNOSIS — N5201 Erectile dysfunction due to arterial insufficiency: Secondary | ICD-10-CM

## 2024-02-01 DIAGNOSIS — Z87442 Personal history of urinary calculi: Secondary | ICD-10-CM | POA: Diagnosis not present

## 2024-02-01 DIAGNOSIS — N312 Flaccid neuropathic bladder, not elsewhere classified: Secondary | ICD-10-CM

## 2024-02-01 DIAGNOSIS — Z87438 Personal history of other diseases of male genital organs: Secondary | ICD-10-CM | POA: Diagnosis not present

## 2024-02-01 DIAGNOSIS — N323 Diverticulum of bladder: Secondary | ICD-10-CM

## 2024-02-01 MED ORDER — SILDENAFIL CITRATE 100 MG PO TABS: ORAL_TABLET | ORAL | 99 refills | Status: AC

## 2024-02-02 ENCOUNTER — Ambulatory Visit: Payer: Self-pay

## 2024-02-02 LAB — PSA: Prostate Specific Ag, Serum: 7.8 ng/mL — ABNORMAL HIGH (ref 0.0–4.0)

## 2024-02-02 NOTE — Telephone Encounter (Signed)
-----   Message from Garnette HERO Dahlstedt sent at 02/02/2024  1:52 PM EST ----- Please notify the patient that his PSA is stable at 7.8 ===View-only below this line===at  ----- Message ----- From: Interface, [facility] Lab Results In Sent: 02/02/2024   5:38 AM EST To: Garnette Shack, MD

## 2024-02-02 NOTE — Telephone Encounter (Signed)
 Called pt sister (DPR on file) due to pt phone number being disconnected, pt sister given pt PSA results per MD Dahlstedt pt sister voiced her understanding and stated she would make pt aware

## 2024-02-07 ENCOUNTER — Telehealth: Payer: Self-pay

## 2024-02-07 NOTE — Telephone Encounter (Signed)
 Called [facility] to have cath form re-faxed with appropriated MD name

## 2024-02-09 NOTE — Telephone Encounter (Signed)
 MD signed and ordered faxed to [facility]

## 2024-02-15 ENCOUNTER — Encounter (INDEPENDENT_AMBULATORY_CARE_PROVIDER_SITE_OTHER): Payer: Self-pay | Admitting: *Deleted

## 2024-03-07 ENCOUNTER — Ambulatory Visit (INDEPENDENT_AMBULATORY_CARE_PROVIDER_SITE_OTHER): Admitting: Gastroenterology

## 2024-03-07 ENCOUNTER — Encounter (INDEPENDENT_AMBULATORY_CARE_PROVIDER_SITE_OTHER): Payer: Self-pay | Admitting: Gastroenterology

## 2024-03-07 VITALS — BP 131/85 | HR 71 | Temp 97.5°F | Ht 72.0 in | Wt 137.3 lb

## 2024-03-07 DIAGNOSIS — B182 Chronic viral hepatitis C: Secondary | ICD-10-CM | POA: Diagnosis not present

## 2024-03-07 DIAGNOSIS — R1310 Dysphagia, unspecified: Secondary | ICD-10-CM | POA: Diagnosis not present

## 2024-03-07 DIAGNOSIS — K5904 Chronic idiopathic constipation: Secondary | ICD-10-CM | POA: Insufficient documentation

## 2024-03-07 DIAGNOSIS — Z8619 Personal history of other infectious and parasitic diseases: Secondary | ICD-10-CM

## 2024-03-07 DIAGNOSIS — K5732 Diverticulitis of large intestine without perforation or abscess without bleeding: Secondary | ICD-10-CM

## 2024-03-07 DIAGNOSIS — K5900 Constipation, unspecified: Secondary | ICD-10-CM

## 2024-03-07 DIAGNOSIS — B1920 Unspecified viral hepatitis C without hepatic coma: Secondary | ICD-10-CM | POA: Insufficient documentation

## 2024-03-07 DIAGNOSIS — R1319 Other dysphagia: Secondary | ICD-10-CM

## 2024-03-07 NOTE — Patient Instructions (Signed)
 It was very nice to meet you today, as dicussed with will plan for the following :  1) Upper endoscopy and colonoscopy  2) Labs work and ultrasound

## 2024-03-07 NOTE — Progress Notes (Addendum)
 Ian Boyd , M.D. Gastroenterology & Hepatology [facility] Gastroenterology [address] [place], [place] [address] Primary Care Physician: Ian Elspeth BRAVO, MD [address] [place] [address]  Chief Complaint: Dysphagia, history of diverticulitis, hepatitis C  History of Present Illness: Ian Boyd is a 73 y.o. male who history of Schatzki's ring, H. pylori, Candida esophagitis, SINCE abuse presents for evaluation of  Dysphagia, history of diverticulitis, hepatitis C  Patient was last seen by Dr. Bill in 2020 underwent upper endoscopy with dilation and was treated for Candida esophagitis and was prescribed Pylera as well Patient reports solid and liquid food dysphagia for over 5 years, after last dilation his symptoms improved which have recurred now.  Reports food hanging in an upper chest area.  Patient is unsure about completing H. pylori treatment with Pylera Patient had a history of diverticulitis 2 years back but never had a follow-up colonoscopy Patient has a positive hepatitis C viral load 06/2021 and reports no previous treatments  Last EGD:2020  - Esophageal plaques were found, suspicious for candidiasis. Cells for cytology obtained. - Benign- appearing distal esophageal stenosis/ spastic segment. Dilated. - LA Grade A reflux esophagitis. - 2 cm hiatal hernia. - Gastritis. - One non- bleeding duodenal ulcer with no stigmata of bleeding. - Normal second portion of the duodenum.  Past Medical History: Past Medical History:  Diagnosis Date   Acid reflux    Blind right eye    BPH (benign prostatic hyperplasia)    COPD (chronic obstructive pulmonary disease) (HCC)    Drug abuse (HCC)    Glaucoma    Hepatitis C    History of severe sepsis    Hyponatremia    Psoas abscess, right (HCC) 06/2021   Rotator cuff tear    Chronic right   Sepsis (HCC) 06/2021   E coli bacteremia from a urinary source complicated by obstructive ureteral  calculus   Thrombocytopenia     Past Surgical History: Past Surgical History:  Procedure Laterality Date   CATARACT EXTRACTION Left    CYSTOSCOPY/URETEROSCOPY/HOLMIUM LASER/STENT PLACEMENT N/A 08/11/2021   Procedure: CYSTOSCOPY/BILATERAL URETEROSCOPY/HOLMIUM LASER/RETROGRADE PYELOGRAMS/RIGHT STENT PLACEMENT;  Surgeon: Selma Donnice SAUNDERS, MD;  Location: [facility];  Service: Urology;  Laterality: N/A;   ESOPHAGEAL DILATION N/A 05/12/2018   Procedure: ESOPHAGEAL DILATION;  Surgeon: Golda Claudis PENNER, MD;  [facility];  Service: Endoscopy;  Laterality: N/A;   ESOPHAGOGASTRODUODENOSCOPY N/A 05/12/2018   Procedure: ESOPHAGOGASTRODUODENOSCOPY (EGD);  Surgeon: Golda Claudis PENNER, MD;  [facility];  Service: Endoscopy;  Laterality: N/A;  2:45   IR NEPHROSTOMY PLACEMENT LEFT  06/26/2021   IR RADIOLOGIST EVAL & MGMT  07/16/2021    Family History:History reviewed. No pertinent family history.  Social History:Tobacco Use History[1] Social History   Substance and Sexual Activity  Alcohol Use Not Currently   Comment: quit 5-7 yrs ago.    Social History   Substance and Sexual Activity  Drug Use Yes   Types: Cocaine, Marijuana   Comment: Does marijuana and cocaine.     Allergies: Allergies[2]  Medications: Current Outpatient Medications  Medication Sig Dispense Refill   Albuterol  Sulfate, sensor, (PROAIR  DIGIHALER) 108 (90 Base) MCG/ACT AEPB Inhale 1 puff into the lungs daily as needed (shortness of breath).     dorzolamide -timolol  (COSOPT ) 22.3-6.8 MG/ML ophthalmic solution Place 1 drop into the left eye 2 (two) times daily. (0900 & 2100)     INCRUSE ELLIPTA  62.5 MCG/ACT AEPB Inhale 1 puff into  the lungs in the morning.     sildenafil  (VIAGRA ) 100 MG tablet Take 1/2 to 1 tablet p.o. as needed 20 tablet prn   SPIRIVA  RESPIMAT 2.5 MCG/ACT AERS Take 1 Inhaler by mouth daily.     sulfamethoxazole -trimethoprim  (BACTRIM  DS) 800-160 MG tablet Take 1 tablet by mouth 2 (two) times daily.  6 tablet 0   VYZULTA  0.024 % SOLN Place 1 drop into the left eye at bedtime. (2100)     amLODipine  (NORVASC ) 5 MG tablet Take 5 mg by mouth daily. (Patient not taking: Reported on 02/01/2024)     Nutritional Supplements (NUTRITIONAL SUPPLEMENT PO) Take 120 mLs by mouth in the morning, at noon, and at bedtime. Medpass (0800,1300 & 1700) (Patient not taking: Reported on 03/07/2024)     pantoprazole  (PROTONIX ) 40 MG tablet TAKE 1 TABLET (40 MG TOTAL) BY MOUTH 2 (TWO) TIMES DAILY BEFORE A MEAL. (Patient not taking: Reported on 03/07/2024) 180 tablet 1   RHOPRESSA  0.02 % SOLN Place 1 drop into the left eye at bedtime. (2100) (Patient not taking: Reported on 02/01/2024)     tamsulosin  (FLOMAX ) 0.4 MG CAPS capsule TAKE 1 CAPSULE BY MOUTH EVERY DAY (Patient not taking: Reported on 03/07/2024) 90 capsule 1   No current facility-administered medications for this visit.    Review of Systems: GENERAL: negative for malaise, night sweats HEENT: No changes in hearing or vision, no nose bleeds or other nasal problems. NECK: Negative for lumps, goiter, pain and significant neck swelling RESPIRATORY: Negative for cough, wheezing CARDIOVASCULAR: Negative for chest pain, leg swelling, palpitations, orthopnea GI: SEE HPI MUSCULOSKELETAL: Negative for joint pain or swelling, back pain, and muscle pain. SKIN: Negative for lesions, rash HEMATOLOGY Negative for prolonged bleeding, bruising easily, and swollen nodes. ENDOCRINE: Negative for cold or heat intolerance, polyuria, polydipsia and goiter. NEURO: negative for tremor, gait imbalance, syncope and seizures. The remainder of the review of systems is noncontributory.   Physical Exam: BP 131/85 (BP Location: Left Arm, Patient Position: Sitting, Cuff Size: Normal)   Pulse 71   Temp (!) 97.5 F (36.4 C) (Temporal)   Ht 6' (1.829 m)   Wt 137 lb 4.8 oz (62.3 kg)   BMI 18.62 kg/m  GENERAL: The patient is AO x3, in no acute distress. HEENT: Head is  normocephalic and atraumatic. EOMI are intact. Mouth is well hydrated and without lesions. NECK: Supple. No masses LUNGS: Clear to auscultation. No presence of rhonchi/wheezing/rales. Adequate chest expansion HEART: RRR, normal s1 and s2. ABDOMEN: Soft, nontender, no guarding, no peritoneal signs, and nondistended. BS +. No masses.  Imaging/Labs: as above     Latest Ref Rng & Units 08/20/2021    5:04 AM 08/19/2021    4:52 AM 08/18/2021    3:56 AM  CBC  WBC 4.0 - 10.5 K/uL 18.3  18.4  18.6   Hemoglobin 13.0 - 17.0 g/dL 88.5  88.6  88.9   Hematocrit 39.0 - 52.0 % 36.5  36.1  33.6   Platelets 150 - 400 K/uL 425  375  311    No results found for: IRON, TIBC, FERRITIN  I personally reviewed and interpreted the available labs, imaging and endoscopic files.  2020  IMPRESSION: Severely impaired esophageal motility as above.   Stricture at the gastroesophageal junction which obstructed a 12.5 mm diameter barium tablet.   Additional narrowing at the cervical esophagus both in AP and transverse dimensions, though the barium tablet was able to pass beyond this point.   IMPRESSION: Moderate to large  volume of stool throughout the colon. The punctate stones on prior not well demonstrated by radiograph.    IMPRESSION: 1. There is inflammatory stranding along the cecum and ascending colon the etiology of which is difficult to determine given the relative paucity of abdominal fat and lack of intravenous contrast material. There is no significant right-sided perinephric stranding and the appendix appears normal. However, there are right-sided colonic diverticula, as such these findings are most compatible with acute uncomplicated diverticulitis. 2. Left percutaneous nephrostomy tube with pigtails coiled in the renal pelvis and urinary bladder without hydronephrosis. 3. Left nephroureterostomy tube with pigtails coiled in the renal pelvis and urinary bladder without  hydronephrosis. 4. Bilateral nonobstructive renal stones. 5. Small bilateral inguinal hernias both containing fat and a short segment of nonobstructed bowel. 6.  Aortic Atherosclerosis (ICD10-I70.0).   Impression and Plan:  Ian Boyd is a 73 y.o. male who history of Schatzki's ring, H. pylori, Candida esophagitis, SINCE abuse presents for evaluation of  Dysphagia, history of diverticulitis, hepatitis C  # Dysphagia # H. pylori and Candida esophagitis  Patient had abnormal esophagogram 2020 with dysmotility and cervical esophagus and GE junction narrowing.  Underwent balloon dilation to 18 mm 2020 found to have  Candida esophagitis and H. pylori as well as at time.  Patient unsure of completing Pylera treatment  Recommend upper endoscopy with possible large bore dilation with savory for possible cricopharyngeal bar and GE junction narrowing  Will also assess for Candida esophagitis and take biopsies for H. pylori at this time  If patient has persistent dysphagia will recommend high-resolution manometry as there is evidence of esophageal dysmotility in last esophagogram  # Diverticulitis #Constipation   Patient had CT finding of uncomplicated diverticulitis in 2023 but never had a follow-up colonoscopy  Was scheduled for colonoscopy as well along with upper endoscopy  Imaging with large stool burden Ensure adequate fluid intake: Aim for 8 glasses of water  daily. Follow a high fiber diet: Include foods such as dates, prunes, pears, and kiwi. Take Miralax twice a day for the first week, then reduce to once daily thereafter. Use Metamucil twice a day.  # Hepatitis C positive  Patient has chronic hepatitis C with positive viral load in 2020 and 2023 Treatment nave  We discussed pathophysiology of chronic hepatitis C and if left to be treated can lead to evident fibrosis and cirrhosis which can lead to [facility] and complications of portal hypertension.  Patient is interested in  pursuing treatment and verbalizes compliance with complete duration of treatment to prevent resistance or treatment.  Since it has been 2 years since last lab work will obtain hepatitis C viral load and genotype and assess any underlying HIV/ hepatitis B  Will obtain abdominal ultrasound with elastography to assess for any advanced fibrosis  Will discuss treatment thereafter  All questions were answered.      Cam Dauphin Faizan Abanoub Hanken, MD Gastroenterology and Hepatology [facility] Gastroenterology   This chart has been completed using [facility] Dictation software, and while attempts have been made to ensure accuracy , certain words and phrases may not be transcribed as intended      [1]  Social History Tobacco Use  Smoking Status Every Day   Current packs/day: 0.50   Types: Cigarettes   Passive exposure: Current  Smokeless Tobacco Never  Tobacco Comments   Smokes 1/2 Pack a Day.   Smoking Cessation Classes, Services, Agencies & Resources Provided.  [2] No Known Allergies

## 2024-03-08 ENCOUNTER — Telehealth (INDEPENDENT_AMBULATORY_CARE_PROVIDER_SITE_OTHER): Payer: Self-pay

## 2024-03-08 ENCOUNTER — Other Ambulatory Visit (INDEPENDENT_AMBULATORY_CARE_PROVIDER_SITE_OTHER): Payer: Self-pay

## 2024-03-08 DIAGNOSIS — R1319 Other dysphagia: Secondary | ICD-10-CM

## 2024-03-08 DIAGNOSIS — K5732 Diverticulitis of large intestine without perforation or abscess without bleeding: Secondary | ICD-10-CM

## 2024-03-08 NOTE — Telephone Encounter (Signed)
 ATC patient to give ultrasound appt and to inform patient of drug screening ordered, no answer. LVM for call back. Sending letter for ultrasound appointment.

## 2024-03-20 ENCOUNTER — Ambulatory Visit (HOSPITAL_COMMUNITY): Attending: Gastroenterology

## 2025-01-31 ENCOUNTER — Ambulatory Visit: Admitting: Urology
